# Patient Record
Sex: Female | Born: 1965 | Race: White | Hispanic: No | Marital: Married | Smoking: Never smoker
Health system: Southern US, Community
[De-identification: ages and names within clinical notes are randomized; demographics above are authoritative.]

## PROBLEM LIST (undated history)

## (undated) DIAGNOSIS — I5042 Chronic combined systolic (congestive) and diastolic (congestive) heart failure: Secondary | ICD-10-CM

## (undated) DIAGNOSIS — K219 Gastro-esophageal reflux disease without esophagitis: Secondary | ICD-10-CM

## (undated) DIAGNOSIS — M109 Gout, unspecified: Secondary | ICD-10-CM

## (undated) DIAGNOSIS — N189 Chronic kidney disease, unspecified: Secondary | ICD-10-CM

## (undated) DIAGNOSIS — I25119 Atherosclerotic heart disease of native coronary artery with unspecified angina pectoris: Secondary | ICD-10-CM

## (undated) DIAGNOSIS — I110 Hypertensive heart disease with heart failure: Secondary | ICD-10-CM

## (undated) DIAGNOSIS — Z95810 Presence of automatic (implantable) cardiac defibrillator: Secondary | ICD-10-CM

## (undated) DIAGNOSIS — I420 Dilated cardiomyopathy: Secondary | ICD-10-CM

## (undated) DIAGNOSIS — F39 Unspecified mood [affective] disorder: Secondary | ICD-10-CM

## (undated) HISTORY — DX: Chronic combined systolic (congestive) and diastolic (congestive) heart failure: I50.42

## (undated) HISTORY — DX: Chronic kidney disease, unspecified: N18.9

## (undated) HISTORY — DX: Unspecified mood (affective) disorder: F39

## (undated) HISTORY — DX: Gastro-esophageal reflux disease without esophagitis: K21.9

## (undated) HISTORY — DX: Atherosclerotic heart disease of native coronary artery with unspecified angina pectoris: I25.119

## (undated) HISTORY — DX: Gout, unspecified: M10.9

## (undated) HISTORY — DX: Dilated cardiomyopathy: I42.0

## (undated) HISTORY — DX: Hypertensive heart disease with heart failure: I11.0

---

## 2011-06-10 DIAGNOSIS — I420 Dilated cardiomyopathy: Secondary | ICD-10-CM | POA: Insufficient documentation

## 2011-06-10 DIAGNOSIS — I110 Hypertensive heart disease with heart failure: Secondary | ICD-10-CM

## 2011-06-10 DIAGNOSIS — Z95810 Presence of automatic (implantable) cardiac defibrillator: Secondary | ICD-10-CM

## 2011-06-10 HISTORY — DX: Presence of automatic (implantable) cardiac defibrillator: Z95.810

## 2011-06-10 HISTORY — DX: Hypertensive heart disease with heart failure: I11.0

## 2011-06-10 HISTORY — DX: Dilated cardiomyopathy: I42.0

## 2012-06-09 DIAGNOSIS — I2510 Atherosclerotic heart disease of native coronary artery without angina pectoris: Secondary | ICD-10-CM | POA: Insufficient documentation

## 2012-06-09 DIAGNOSIS — I25119 Atherosclerotic heart disease of native coronary artery with unspecified angina pectoris: Secondary | ICD-10-CM

## 2012-06-09 HISTORY — DX: Atherosclerotic heart disease of native coronary artery with unspecified angina pectoris: I25.119

## 2013-06-09 HISTORY — PX: CARDIAC DEFIBRILLATOR PLACEMENT: SHX171

## 2017-06-09 HISTORY — PX: TRANSTHORACIC ECHOCARDIOGRAM: SHX275

## 2017-07-02 ENCOUNTER — Other Ambulatory Visit: Payer: Self-pay

## 2017-07-02 ENCOUNTER — Emergency Department (HOSPITAL_COMMUNITY): Payer: Medicaid Other

## 2017-07-02 ENCOUNTER — Inpatient Hospital Stay (HOSPITAL_COMMUNITY)
Admission: EM | Admit: 2017-07-02 | Discharge: 2017-07-05 | DRG: 315 | Disposition: A | Discharge status: Home / Self Care | Payer: Medicaid Other | Attending: Family Medicine | Admitting: Family Medicine

## 2017-07-02 ENCOUNTER — Encounter (HOSPITAL_COMMUNITY): Payer: Self-pay

## 2017-07-02 DIAGNOSIS — D259 Leiomyoma of uterus, unspecified: Secondary | ICD-10-CM | POA: Diagnosis present

## 2017-07-02 DIAGNOSIS — Z833 Family history of diabetes mellitus: Secondary | ICD-10-CM

## 2017-07-02 DIAGNOSIS — N390 Urinary tract infection, site not specified: Secondary | ICD-10-CM | POA: Diagnosis present

## 2017-07-02 DIAGNOSIS — Z79891 Long term (current) use of opiate analgesic: Secondary | ICD-10-CM

## 2017-07-02 DIAGNOSIS — I319 Disease of pericardium, unspecified: Principal | ICD-10-CM | POA: Diagnosis present

## 2017-07-02 DIAGNOSIS — R200 Anesthesia of skin: Secondary | ICD-10-CM | POA: Diagnosis present

## 2017-07-02 DIAGNOSIS — Z95810 Presence of automatic (implantable) cardiac defibrillator: Secondary | ICD-10-CM

## 2017-07-02 DIAGNOSIS — B349 Viral infection, unspecified: Secondary | ICD-10-CM | POA: Diagnosis present

## 2017-07-02 DIAGNOSIS — Z955 Presence of coronary angioplasty implant and graft: Secondary | ICD-10-CM

## 2017-07-02 DIAGNOSIS — R0789 Other chest pain: Secondary | ICD-10-CM | POA: Diagnosis present

## 2017-07-02 DIAGNOSIS — I160 Hypertensive urgency: Secondary | ICD-10-CM | POA: Diagnosis present

## 2017-07-02 DIAGNOSIS — Z7982 Long term (current) use of aspirin: Secondary | ICD-10-CM

## 2017-07-02 DIAGNOSIS — R079 Chest pain, unspecified: Secondary | ICD-10-CM | POA: Diagnosis not present

## 2017-07-02 DIAGNOSIS — E785 Hyperlipidemia, unspecified: Secondary | ICD-10-CM | POA: Diagnosis present

## 2017-07-02 DIAGNOSIS — I5030 Unspecified diastolic (congestive) heart failure: Secondary | ICD-10-CM | POA: Diagnosis present

## 2017-07-02 DIAGNOSIS — Z79899 Other long term (current) drug therapy: Secondary | ICD-10-CM

## 2017-07-02 DIAGNOSIS — M793 Panniculitis, unspecified: Secondary | ICD-10-CM | POA: Diagnosis present

## 2017-07-02 DIAGNOSIS — R011 Cardiac murmur, unspecified: Secondary | ICD-10-CM | POA: Diagnosis present

## 2017-07-02 DIAGNOSIS — I110 Hypertensive heart disease with heart failure: Secondary | ICD-10-CM | POA: Diagnosis present

## 2017-07-02 DIAGNOSIS — M109 Gout, unspecified: Secondary | ICD-10-CM | POA: Diagnosis present

## 2017-07-02 DIAGNOSIS — Z8249 Family history of ischemic heart disease and other diseases of the circulatory system: Secondary | ICD-10-CM

## 2017-07-02 DIAGNOSIS — I252 Old myocardial infarction: Secondary | ICD-10-CM

## 2017-07-02 DIAGNOSIS — Z8349 Family history of other endocrine, nutritional and metabolic diseases: Secondary | ICD-10-CM

## 2017-07-02 DIAGNOSIS — I2510 Atherosclerotic heart disease of native coronary artery without angina pectoris: Secondary | ICD-10-CM | POA: Diagnosis present

## 2017-07-02 DIAGNOSIS — R000 Tachycardia, unspecified: Secondary | ICD-10-CM | POA: Diagnosis present

## 2017-07-02 DIAGNOSIS — I340 Nonrheumatic mitral (valve) insufficiency: Secondary | ICD-10-CM | POA: Diagnosis present

## 2017-07-02 DIAGNOSIS — I255 Ischemic cardiomyopathy: Secondary | ICD-10-CM | POA: Diagnosis present

## 2017-07-02 DIAGNOSIS — Z791 Long term (current) use of non-steroidal anti-inflammatories (NSAID): Secondary | ICD-10-CM

## 2017-07-02 DIAGNOSIS — Z9114 Patient's other noncompliance with medication regimen: Secondary | ICD-10-CM

## 2017-07-02 DIAGNOSIS — M546 Pain in thoracic spine: Secondary | ICD-10-CM | POA: Diagnosis present

## 2017-07-02 DIAGNOSIS — K439 Ventral hernia without obstruction or gangrene: Secondary | ICD-10-CM | POA: Diagnosis present

## 2017-07-02 DIAGNOSIS — J988 Other specified respiratory disorders: Secondary | ICD-10-CM | POA: Diagnosis present

## 2017-07-02 HISTORY — DX: Presence of automatic (implantable) cardiac defibrillator: Z95.810

## 2017-07-02 LAB — I-STAT CHEM 8, ED
BUN: 16 mg/dL (ref 6–20)
CALCIUM ION: 1.19 mmol/L (ref 1.15–1.40)
CHLORIDE: 107 mmol/L (ref 101–111)
Creatinine, Ser: 1.2 mg/dL — ABNORMAL HIGH (ref 0.44–1.00)
Glucose, Bld: 102 mg/dL — ABNORMAL HIGH (ref 65–99)
HEMATOCRIT: 37 % (ref 36.0–46.0)
Hemoglobin: 12.6 g/dL (ref 12.0–15.0)
POTASSIUM: 3.8 mmol/L (ref 3.5–5.1)
SODIUM: 141 mmol/L (ref 135–145)
TCO2: 23 mmol/L (ref 22–32)

## 2017-07-02 LAB — CBC WITH DIFFERENTIAL/PLATELET
Basophils Absolute: 0 10*3/uL (ref 0.0–0.1)
Basophils Relative: 1 %
EOS ABS: 0.3 10*3/uL (ref 0.0–0.7)
Eosinophils Relative: 4 %
HCT: 37.1 % (ref 36.0–46.0)
HEMOGLOBIN: 12.3 g/dL (ref 12.0–15.0)
LYMPHS ABS: 2.6 10*3/uL (ref 0.7–4.0)
Lymphocytes Relative: 40 %
MCH: 30.3 pg (ref 26.0–34.0)
MCHC: 33.2 g/dL (ref 30.0–36.0)
MCV: 91.4 fL (ref 78.0–100.0)
Monocytes Absolute: 0.7 10*3/uL (ref 0.1–1.0)
Monocytes Relative: 10 %
NEUTROS PCT: 45 %
Neutro Abs: 3 10*3/uL (ref 1.7–7.7)
PLATELETS: 270 10*3/uL (ref 150–400)
RBC: 4.06 MIL/uL (ref 3.87–5.11)
RDW: 14.7 % (ref 11.5–15.5)
WBC: 6.6 10*3/uL (ref 4.0–10.5)

## 2017-07-02 LAB — TROPONIN I

## 2017-07-02 LAB — I-STAT TROPONIN, ED: Troponin i, poc: 0 ng/mL (ref 0.00–0.08)

## 2017-07-02 LAB — SEDIMENTATION RATE: SED RATE: 62 mm/h — AB (ref 0–22)

## 2017-07-02 MED ORDER — SODIUM CHLORIDE 0.9% FLUSH: 3.0000 mL | INTRAVENOUS | Status: DC | PRN

## 2017-07-02 MED ORDER — NITROGLYCERIN 0.4 MG SL SUBL
0.4000 mg | SUBLINGUAL_TABLET | Freq: Once | SUBLINGUAL | Status: AC
  Administered 2017-07-02: 0.4 mg via SUBLINGUAL
  Filled 2017-07-02: qty 1

## 2017-07-02 MED ORDER — HYDROMORPHONE HCL 1 MG/ML IJ SOLN
1.0000 mg | INTRAMUSCULAR | Status: DC | PRN
  Administered 2017-07-02 – 2017-07-05 (×10): 1 mg via INTRAVENOUS
  Filled 2017-07-02 (×12): qty 1

## 2017-07-02 MED ORDER — ACETAMINOPHEN 650 MG RE SUPP: 650.0000 mg | Freq: Four times a day (QID) | RECTAL | Status: DC | PRN

## 2017-07-02 MED ORDER — CARVEDILOL 12.5 MG PO TABS
12.5000 mg | ORAL_TABLET | Freq: Two times a day (BID) | ORAL | Status: DC
  Administered 2017-07-03 – 2017-07-05 (×5): 12.5 mg via ORAL
  Filled 2017-07-02 (×5): qty 1

## 2017-07-02 MED ORDER — ATORVASTATIN CALCIUM 40 MG PO TABS
80.0000 mg | ORAL_TABLET | Freq: Every day | ORAL | Status: AC
  Administered 2017-07-03: 80 mg via ORAL
  Filled 2017-07-02: qty 2

## 2017-07-02 MED ORDER — ZOLPIDEM TARTRATE 5 MG PO TABS
5.0000 mg | ORAL_TABLET | Freq: Every day | ORAL | Status: DC
  Administered 2017-07-03 – 2017-07-04 (×3): 5 mg via ORAL
  Filled 2017-07-02 (×3): qty 1

## 2017-07-02 MED ORDER — CLONIDINE HCL 0.1 MG PO TABS
0.1000 mg | ORAL_TABLET | Freq: Every day | ORAL | Status: DC
  Administered 2017-07-03 – 2017-07-05 (×3): 0.1 mg via ORAL
  Filled 2017-07-02 (×3): qty 1

## 2017-07-02 MED ORDER — ENOXAPARIN SODIUM 40 MG/0.4ML ~~LOC~~ SOLN
40.0000 mg | SUBCUTANEOUS | Status: DC
  Administered 2017-07-02 – 2017-07-04 (×3): 40 mg via SUBCUTANEOUS
  Filled 2017-07-02 (×3): qty 0.4

## 2017-07-02 MED ORDER — MORPHINE SULFATE (PF) 4 MG/ML IV SOLN
4.0000 mg | Freq: Once | INTRAVENOUS | Status: AC
  Administered 2017-07-02: 4 mg via INTRAVENOUS
  Filled 2017-07-02: qty 1

## 2017-07-02 MED ORDER — HYDRALAZINE HCL 20 MG/ML IJ SOLN
5.0000 mg | Freq: Four times a day (QID) | INTRAMUSCULAR | Status: DC | PRN
  Administered 2017-07-02 – 2017-07-05 (×3): 5 mg via INTRAVENOUS
  Filled 2017-07-02 (×3): qty 1

## 2017-07-02 MED ORDER — PREDNISONE 10 MG PO TABS
10.0000 mg | ORAL_TABLET | Freq: Every day | ORAL | Status: DC
  Administered 2017-07-03: 10 mg via ORAL
  Filled 2017-07-02: qty 1

## 2017-07-02 MED ORDER — IOPAMIDOL (ISOVUE-370) INJECTION 76%
INTRAVENOUS | Status: AC
  Filled 2017-07-02: qty 100

## 2017-07-02 MED ORDER — SODIUM CHLORIDE 0.9% FLUSH
3.0000 mL | Freq: Two times a day (BID) | INTRAVENOUS | Status: DC
  Administered 2017-07-02 – 2017-07-04 (×5): 3 mL via INTRAVENOUS

## 2017-07-02 MED ORDER — PANTOPRAZOLE SODIUM 40 MG PO TBEC
40.0000 mg | DELAYED_RELEASE_TABLET | Freq: Every day | ORAL | Status: DC
Start: 2017-07-03 — End: 2017-07-05
  Administered 2017-07-03 – 2017-07-05 (×3): 40 mg via ORAL
  Filled 2017-07-02 (×3): qty 1

## 2017-07-02 MED ORDER — ASPIRIN EC 81 MG PO TBEC
81.0000 mg | DELAYED_RELEASE_TABLET | Freq: Every day | ORAL | Status: DC
  Administered 2017-07-03 – 2017-07-05 (×3): 81 mg via ORAL
  Filled 2017-07-02 (×3): qty 1

## 2017-07-02 MED ORDER — SODIUM CHLORIDE 0.9 % IV SOLN: 250.0000 mL | INTRAVENOUS | Status: DC | PRN

## 2017-07-02 MED ORDER — FUROSEMIDE 20 MG PO TABS
20.0000 mg | ORAL_TABLET | Freq: Every day | ORAL | Status: DC
  Administered 2017-07-03 – 2017-07-05 (×3): 20 mg via ORAL
  Filled 2017-07-02 (×3): qty 1

## 2017-07-02 MED ORDER — ACETAMINOPHEN 325 MG PO TABS: 650.0000 mg | ORAL_TABLET | Freq: Four times a day (QID) | ORAL | Status: DC | PRN

## 2017-07-02 MED ORDER — IOPAMIDOL (ISOVUE-370) INJECTION 76%
100.0000 mL | Freq: Once | INTRAVENOUS | Status: AC | PRN
  Administered 2017-07-02: 100 mL via INTRAVENOUS

## 2017-07-02 MED ORDER — CLONIDINE HCL 0.1 MG PO TABS
0.2000 mg | ORAL_TABLET | Freq: Once | ORAL | Status: AC
  Administered 2017-07-02: 0.2 mg via ORAL
  Filled 2017-07-02: qty 2

## 2017-07-02 MED ORDER — TETRAHYDROZOLINE HCL 0.05 % OP SOLN: 2.0000 [drp] | Freq: Every day | OPHTHALMIC | Status: DC | PRN

## 2017-07-02 NOTE — ED Notes (Signed)
EDP J AWARE PT IS IN PAIN. EDP REPLY - HOSPITALIST HAS BEEN CONSULTED.

## 2017-07-02 NOTE — ED Notes (Signed)
Patient transported to CT 

## 2017-07-02 NOTE — ED Notes (Signed)
Bed: WA13 Expected date:  Expected time:  Means of arrival:  Comments: Hold for RES B 

## 2017-07-02 NOTE — ED Notes (Signed)
ED TO INPATIENT HANDOFF REPORT  Name/Age/Gender Kathy Bruce 52 y.o. female  Code Status Code Status History    This patient does not have a recorded code status. Please follow your organizational policy for patients in this situation.      Home/SNF/Other Home  Chief Complaint chest pain  Level of Care/Admitting Diagnosis ED Disposition    ED Disposition Condition Comment   Admit  Hospital Area: [facility] [[id number]]  Level of Care: Telemetry [5]  Admit to tele based on following criteria: Monitor for Ischemic changes  Diagnosis: Chest pain [381017]  Admitting Physician: Jani Gravel [[id number]]  Attending Physician: Jani Gravel [[id number]]  PT Class (Do Not Modify): Observation [104]  PT Acc Code (Do Not Modify): Observation [10022]       Medical History Past Medical History:  Diagnosis Date  . CHF (congestive heart failure) ([place]) 2013   Patient reports this was secondary to MI and she to have AICD placed.   . Coronary artery disease 2013   Patient reports MI in 2013 with stent placement when patient was living in [place]. Need outside records.   . Hypertension   . 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/02/2017    Allergies Not on File  IV Location/Drains/Wounds Patient Lines/Drains/Airways Status   Active Line/Drains/Airways    Name:   Placement date:   Placement time:   Site:   Days:   Peripheral IV 07/02/17 Left Wrist   07/02/17    0959    Wrist   less than 1   Peripheral IV 07/02/17 Left Antecubital   07/02/17    1001    Antecubital   less than 1          Labs/Imaging Results for orders placed or performed during the hospital encounter of 07/02/17 (from the past 48 hour(s))  CBC with Differential     Status: None   Collection Time: 07/02/17 10:00 AM  Result Value Ref Range   WBC 6.6 4.0 - 10.5 K/uL   RBC 4.06 3.87 - 5.11 MIL/uL   Hemoglobin 12.3 12.0 - 15.0 g/dL   HCT 37.1 36.0 - 46.0 %   MCV 91.4 78.0 - 100.0  fL   MCH 30.3 26.0 - 34.0 pg   MCHC 33.2 30.0 - 36.0 g/dL   RDW 14.7 11.5 - 15.5 %   Platelets 270 150 - 400 K/uL   Neutrophils Relative % 45 %   Neutro Abs 3.0 1.7 - 7.7 K/uL   Lymphocytes Relative 40 %   Lymphs Abs 2.6 0.7 - 4.0 K/uL   Monocytes Relative 10 %   Monocytes Absolute 0.7 0.1 - 1.0 K/uL   Eosinophils Relative 4 %   Eosinophils Absolute 0.3 0.0 - 0.7 K/uL   Basophils Relative 1 %   Basophils Absolute 0.0 0.0 - 0.1 K/uL  I-Stat Troponin, ED (not at [facility])     Status: None   Collection Time: 07/02/17 10:06 AM  Result Value Ref Range   Troponin i, poc 0.00 0.00 - 0.08 ng/mL   Comment 3            Comment: Due to the release kinetics of cTnI, a negative result within the first hours of the onset of symptoms does not rule out myocardial infarction with certainty. If myocardial infarction is still suspected, repeat the test at appropriate intervals.   I-stat Chem 8, ED     Status: Abnormal   Collection Time: 07/02/17 10:07 AM  Result Value Ref Range  Sodium 141 135 - 145 mmol/L   Potassium 3.8 3.5 - 5.1 mmol/L   Chloride 107 101 - 111 mmol/L   BUN 16 6 - 20 mg/dL   Creatinine, Ser 1.20 (H) 0.44 - 1.00 mg/dL   Glucose, Bld 102 (H) 65 - 99 mg/dL   Calcium, Ion 1.19 1.15 - 1.40 mmol/L   TCO2 23 22 - 32 mmol/L   Hemoglobin 12.6 12.0 - 15.0 g/dL   HCT 37.0 36.0 - 46.0 %   Dg Chest Portable 1 View  Result Date: 07/02/2017 CLINICAL DATA:  Back pain for 2 days and chest pain starting yesterday. EXAM: PORTABLE CHEST 1 VIEW COMPARISON:  None. FINDINGS: Subcutaneous implantable cardioverter-defibrillator noted. Mild enlargement of the cardiopericardial silhouette. The lungs appear clear. No pleural effusion identified. IMPRESSION: 1. Mild enlargement of the cardiopericardial silhouette, without edema. 2. Subcutaneous implantable cardioverter-defibrillator noted. Electronically Signed   By: Van Clines M.D.   On: 07/02/2017 10:58   Ct Angio Chest/abd/pel For Dissection W  And/or Wo Contrast  Result Date: 07/02/2017 CLINICAL DATA:  Chest and abdominal pain EXAM: CT ANGIOGRAPHY CHEST, ABDOMEN AND PELVIS TECHNIQUE: Initially, axial CT images were obtained through the chest without intravenous contrast material administration. Multidetector CT imaging through the chest, abdomen and pelvis was performed using the standard protocol during bolus administration of intravenous contrast. Multiplanar reconstructed images and MIPs were obtained and reviewed to evaluate the vascular anatomy. CONTRAST:  157mL ISOVUE-370 IOPAMIDOL (ISOVUE-370) INJECTION 76% COMPARISON:  Chest radiograph July 02, 2017 FINDINGS: CTA CHEST FINDINGS Cardiovascular: There is no intramural hematoma in the thoracic aorta on the noncontrast enhanced study. There is no thoracic aortic aneurysm or dissection. The visualized great vessels appear unremarkable. Note that the right and left common carotid arteries arise as a common trunk, an anatomic variant. There is no appreciable pericardial thickening or pericardial effusion. There is no demonstrable pulmonary embolus. A defibrillator is present on the left with the defibrillator lead tip in the soft tissues anteriorly on the left. Mediastinum/Nodes: Visualized thyroid appears normal. No adenopathy is evident in the thoracic region by size criteria. There are scattered subcentimeter axillary lymph nodes, regarded as nonspecific. No esophageal lesions are evident. Lungs/Pleura: There is no lung edema or consolidation. No pleural effusion or pleural thickening evident. Musculoskeletal: There are no blastic or lytic bone lesions. Review of the MIP images confirms the above findings. CTA ABDOMEN AND PELVIS FINDINGS VASCULAR Aorta: No appreciable thoracic aortic aneurysm or dissection. No appreciable aortic narrowing or calcification. No appreciable atherosclerotic plaque. Celiac: Celiac artery and its major branches appear patent. No aneurysm or dissection. SMA: Superior  mesenteric artery and its branches appear patent. No aneurysm or dissection. Either renal artery or its branches. No fibromuscular dysplasia. No aneurysm or dissection on either side. Each IMA: Inferior mesenteric artery and its branches are patent. No aneurysm or dissection. Inflow: Major pelvic arterial vessels are patent as are proximal superficial femoral and profunda femoral arteries. No pelvic or upper thigh vascular atherosclerosis appreciable. No aneurysm or dissection in the major pelvic arterial vessels. Veins: No obvious venous abnormality within the limitations of this arterial phase study. Review of the MIP images confirms the above findings. NON-VASCULAR Hepatobiliary: No focal liver lesions are apparent. Gallbladder wall is not appreciably thickened. There is no biliary duct dilatation. Pancreas: No pancreatic mass or inflammatory focus. Spleen: No splenic lesions are evident. Adrenals/Urinary Tract: Adrenals bilaterally appear unremarkable. Kidneys bilaterally show no evident mass or hydronephrosis on either side. There is no appreciable renal or  ureteral calculus on either side. Urinary bladder is midline with wall thickness within normal limits. Stomach/Bowel: There is no appreciable bowel wall or mesenteric thickening. No evident bowel obstruction. No free air or portal venous air. Lymphatic: No adenopathy is apparent in the abdomen or pelvis. Reproductive: Uterus is anteverted. Uterus appears somewhat lobulated in contour, likely due to underlying leiomyomatous change. There is a mass arising from the rightward aspect of the uterus somewhat eccentrically felt to represent a leiomyoma measuring 5.5 x 4.4 x 3.6 cm. No extrauterine pelvic mass evident. Other: Appendix appears unremarkable. No abscess or ascites is evident in the abdomen or pelvis. There is a focal ventral hernia slightly superior to the umbilicus containing fat and mild changes of panniculitis. This hernia has a neck measurement  from right to left of 1.2 cm and from superior to inferior dimension of 0.8 cm. No bowel containing hernia evident. Musculoskeletal: There are no blastic or lytic bone lesions. There is no intramuscular or abdominal wall lesion. Review of the MIP images confirms the above findings. IMPRESSION: CT angiogram chest: 1. No thoracic aortic aneurysm or dissection. No evident pulmonary embolus. 2. Subcutaneous defibrillator present with lead tip anterior left chest wall. 3.  No lung edema or consolidation. 4.  No evident thoracic adenopathy. CT angiogram abdomen; CT angiogram pelvis: 1. No aneurysm or dissection in the aorta or major mesenteric/pelvic arterial vessels. No appreciable atherosclerotic change noted in the arterial vessels in the abdomen or pelvis. 2. Focal ventral hernia slightly superior to the umbilicus which contains fat and mild panniculitis. No bowel containing hernia evident. 3.  Leiomyomatous uterus. 4.  No bowel obstruction.  No abscess.  No ascites. 5.  No renal or ureteral calculus.  No hydronephrosis. Electronically Signed   By: Lowella Grip III M.D.   On: 07/02/2017 12:05    Pending Labs Unresulted Labs (From admission, onward)   Start     Ordered   07/02/17 1626  Sedimentation rate  Once,   R     07/02/17 1625   Signed and Held  HIV antibody (Routine Testing)  Once,   R     Signed and Held   Signed and Held  Creatinine, serum  (enoxaparin (LOVENOX)    CrCl >/= 30 ml/min)  Weekly,   R    Comments:  while on enoxaparin therapy    Signed and Held   Signed and Held  Comprehensive metabolic panel  Tomorrow morning,   R     Signed and Held   Signed and Held  CBC  Tomorrow morning,   R     Signed and Held   Signed and Held  Troponin I  Now then every 6 hours,   R     Signed and Held   Signed and Held  Lipid panel  Tomorrow morning,   R     Signed and Held      Vitals/Pain Today's Vitals   07/02/17 1902 07/02/17 1904 07/02/17 1905 07/02/17 1946  BP: (!) 145/101   (!)  154/93  Pulse:  92  (!) 109  Resp: (!) 9 17  20   Temp:      TempSrc:      SpO2:  98%  100%  Weight:      Height:      PainSc:   7      Isolation Precautions No active isolations  Medications Medications  iopamidol (ISOVUE-370) 76 % injection (not administered)  iopamidol (ISOVUE-370) 76 % injection 100 mL (  100 mLs Intravenous Contrast Given 07/02/17 1129)  cloNIDine (CATAPRES) tablet 0.2 mg (0.2 mg Oral Given 07/02/17 1343)  nitroGLYCERIN (NITROSTAT) SL tablet 0.4 mg (0.4 mg Sublingual Given 07/02/17 1342)  morphine 4 MG/ML injection 4 mg (4 mg Intravenous Given 07/02/17 1902)    Mobility walks

## 2017-07-02 NOTE — ED Provider Notes (Signed)
Complaint of anterior chest pain constant since this morning.  IV morphine ordered.  I have consulted Dr. Maudie Mercury who will arrange for overnight stay.   Orlie Dakin, MD 07/02/17 Bosie Helper

## 2017-07-02 NOTE — H&P (Signed)
TRH H&P   Patient Demographics:    Kathy Bruce, is a 52 y.o. female  MRN: [id number]   DOB - Jan 12, 1966  Admit Date - 07/02/2017  Outpatient Primary MD for the patient is Patient, No Pcp Per  Referring MD/NP/PA: Rod Holler  Outpatient Specialists:   Patient coming from: home  Chief Complaint  Patient presents with  . Chest Pain      HPI:    Kathy Bruce  is a 52 y.o. female, CAD, CHF (EF 25 approx), s/p ICD, 2013, hypertension, apparently c/o chest pain since last nite. Tightness,  Substernal, took 2 slg nitro without relief.  Apparently she has had upper back pain for the past 3 days.  Pt had some kind of viral illness over the weekend.   Pt states that continued to have chest discomfort this am without relief from slg nitro and therefore presented to ER.   In ED,  CTA chest  IMPRESSION: CT angiogram chest:  1. No thoracic aortic aneurysm or dissection. No evident pulmonary embolus.  2. Subcutaneous defibrillator present with lead tip anterior left chest wall.  3.  No lung edema or consolidation.  4.  No evident thoracic adenopathy.  CT angiogram abdomen; CT angiogram pelvis:  1. No aneurysm or dissection in the aorta or major mesenteric/pelvic arterial vessels. No appreciable atherosclerotic change noted in the arterial vessels in the abdomen or pelvis.  2. Focal ventral hernia slightly superior to the umbilicus which contains fat and mild panniculitis. No bowel containing hernia evident.  3.  Leiomyomatous uterus.  4.  No bowel obstruction.  No abscess.  No ascites.  5.  No renal or ureteral calculus.  No hydronephrosis.  Wbc 6.6, hgb 12.3, Plt 270 Trop 0.00 Na 141, K 3.8, Bun 16, Creatinine 1.20  ESR pending  EKG ST at 100, nl axis,  Slight st depression in v5,6  Cardiology consulted by ED.   Pt will be admitted for chest  pain  .       Review of systems:    In addition to the HPI above, No Fever-chills, No Headache, No changes with Vision or hearing, No problems swallowing food or Liquids, No Cough or Shortness of Breath, No Abdominal pain, No Nausea or Vommitting, Bowel movements are regular, No Blood in stool or Urine, No dysuria, No new skin rashes or bruises, No new joints pains-aches,  No new weakness, tingling, numbness in any extremity, No recent weight gain or loss, No polyuria, polydypsia or polyphagia, No significant Mental Stressors.  A full 10 point Review of Systems was done, except as stated above, all other Review of Systems were negative.   With Past History of the following :    Past Medical History:  Diagnosis Date  . CHF (congestive heart failure) ([place]) 2013   Patient reports this was secondary to MI and she to have AICD placed.   Marland Kitchen  Coronary artery disease 2013   Patient reports MI in 2013 with stent placement when patient was living in [place]. Need outside records.   . Hypertension   . ICD (implantable cardioverter-defibrillator) in place, [facility], 2013 07/02/2017      Past Surgical History:  Procedure Laterality Date  . [employer] ICD      Social History:     Social History   Tobacco Use  . Smoking status: Never Smoker  . Smokeless tobacco: Never Used  Substance Use Topics  . Alcohol use: Yes    Frequency: Never    Comment: Occasionally     Lives - at home  Mobility - walks by self    Family History :     Family History  Problem Relation Age of Onset  . Hyperthyroidism Mother   . Diabetes Mother   . Thyroid disease Brother       Home Medications:   Prior to Admission medications   Medication Sig Start Date End Date Taking? Authorizing Provider  aspirin EC 81 MG tablet Take 81 mg by mouth daily.   Yes [provider]  carvedilol (COREG) 12.5 MG tablet Take 12.5 mg by mouth 2 (two) times daily  with a meal.   Yes [provider]  cloNIDine (CATAPRES) 0.2 MG tablet Take 0.1 mg by mouth daily.   Yes [provider]  colchicine 0.6 MG tablet Take 0.6 mg by mouth daily as needed (GOUT FLARE UP).   Yes [provider]  furosemide (LASIX) 20 MG tablet Take 20 mg by mouth.   Yes [provider]  naproxen (NAPROSYN) 500 MG tablet Take 500 mg by mouth 2 (two) times daily as needed for mild pain.   Yes [provider]  omeprazole (PRILOSEC) 40 MG capsule Take 40 mg by mouth daily.   Yes [provider]  oxyCODONE-acetaminophen (PERCOCET) 10-325 MG tablet Take 1 tablet by mouth 2 (two) times daily as needed for pain.   Yes [provider]  predniSONE (DELTASONE) 10 MG tablet Take 10 mg by mouth daily with breakfast.   Yes [provider]  Tetrahydrozoline HCl (VISINE OP) Place 2 drops into both eyes daily as needed (DRY EYES).   Yes [provider]  zolpidem (AMBIEN) 10 MG tablet Take 10 mg by mouth at bedtime.   Yes [provider]     Allergies:    Not on File   Physical Exam:   Vitals  Blood pressure (!) 154/93, pulse (!) 109, temperature 98 F (36.7 C), temperature source Oral, resp. rate 20, height 5' 2"  (1.575 m), weight 83 kg (183 lb), SpO2 100 %.   1. General  lying in bed in NAD,    2. Normal affect and insight, Not Suicidal or Homicidal, Awake Alert, Oriented X 3.  3. No F.N deficits, ALL C.Nerves Intact, Strength 5/5 all 4 extremities, Sensation intact all 4 extremities, Plantars down going.  4. Ears and Eyes appear Normal, Conjunctivae clear, PERRLA. Moist Oral Mucosa.  5. Supple Neck, No JVD, No cervical lymphadenopathy appriciated, No Carotid Bruits.  6. Symmetrical Chest wall movement, Good air movement bilaterally, CTAB.  7. RRR, No Gallops, Rubs or Murmurs, No Parasternal Heave.  8. Positive Bowel Sounds, Abdomen Soft, No tenderness, No organomegaly appriciated,No rebound  -guarding or rigidity.  9.  No Cyanosis, Normal Skin Turgor, No Skin Rash or Bruise.  10. Good muscle tone,  joints appear normal , no effusions,  Normal ROM.  11. No Palpable Lymph Nodes in Neck or Axillae  +chest wall tenderness in substernal chest + ICD    Data Review:    CBC Recent Labs  Lab 07/02/17 1000 07/02/17 1007  WBC 6.6  --   HGB 12.3 12.6  HCT 37.1 37.0  PLT 270  --   MCV 91.4  --   MCH 30.3  --   MCHC 33.2  --   RDW 14.7  --   LYMPHSABS 2.6  --   MONOABS 0.7  --   EOSABS 0.3  --   BASOSABS 0.0  --    ------------------------------------------------------------------------------------------------------------------  Chemistries  Recent Labs  Lab 07/02/17 1007  NA 141  K 3.8  CL 107  GLUCOSE 102*  BUN 16  CREATININE 1.20*   ------------------------------------------------------------------------------------------------------------------ estimated creatinine clearance is 55.4 mL/min (A) (by C-G formula based on SCr of 1.2 mg/dL (H)). ------------------------------------------------------------------------------------------------------------------ No results for input(s): TSH, T4TOTAL, T3FREE, THYROIDAB in the last 72 hours.  Invalid input(s): FREET3  Coagulation profile No results for input(s): INR, PROTIME in the last 168 hours. ------------------------------------------------------------------------------------------------------------------- No results for input(s): DDIMER in the last 72 hours. -------------------------------------------------------------------------------------------------------------------  Cardiac Enzymes No results for input(s): CKMB, TROPONINI, MYOGLOBIN in the last 168 hours.  Invalid input(s): CK ------------------------------------------------------------------------------------------------------------------ No results found for:  BNP   ---------------------------------------------------------------------------------------------------------------  Urinalysis No results found for: COLORURINE, APPEARANCEUR, LABSPEC, PHURINE, GLUCOSEU, HGBUR, BILIRUBINUR, KETONESUR, PROTEINUR, UROBILINOGEN, NITRITE, LEUKOCYTESUR  ----------------------------------------------------------------------------------------------------------------   Imaging Results:    Dg Chest Portable 1 View  Result Date: 07/02/2017 CLINICAL DATA:  Back pain for 2 days and chest pain starting yesterday. EXAM: PORTABLE CHEST 1 VIEW COMPARISON:  None. FINDINGS: Subcutaneous implantable cardioverter-defibrillator noted. Mild enlargement of the cardiopericardial silhouette. The lungs appear clear. No pleural effusion identified. IMPRESSION: 1. Mild enlargement of the cardiopericardial silhouette, without edema. 2. Subcutaneous implantable cardioverter-defibrillator noted. Electronically Signed   By: Van Clines M.D.   On: 07/02/2017 10:58   Ct Angio Chest/abd/pel For Dissection W And/or Wo Contrast  Result Date: 07/02/2017 CLINICAL DATA:  Chest and abdominal pain EXAM: CT ANGIOGRAPHY CHEST, ABDOMEN AND PELVIS TECHNIQUE: Initially, axial CT images were obtained through the chest without intravenous contrast material administration. Multidetector CT imaging through the chest, abdomen and pelvis was performed using the standard protocol during bolus administration of intravenous contrast. Multiplanar reconstructed images and MIPs were obtained and reviewed to evaluate the vascular anatomy. CONTRAST:  137m ISOVUE-370 IOPAMIDOL (ISOVUE-370) INJECTION 76% COMPARISON:  Chest radiograph July 02, 2017 FINDINGS: CTA CHEST FINDINGS Cardiovascular: There is no intramural hematoma in the thoracic aorta on the noncontrast enhanced study. There is no thoracic aortic aneurysm or dissection. The visualized great vessels appear unremarkable. Note that the right and left common  carotid arteries arise as a common trunk, an anatomic variant. There is no appreciable pericardial thickening or pericardial effusion. There is no demonstrable pulmonary embolus. A defibrillator is present on the left with the defibrillator lead tip in the soft tissues anteriorly on the left. Mediastinum/Nodes: Visualized thyroid appears normal. No adenopathy is evident in the thoracic region by size criteria. There are scattered subcentimeter axillary lymph nodes, regarded as nonspecific. No esophageal lesions are evident. Lungs/Pleura: There is no lung edema or consolidation. No pleural effusion or pleural thickening evident. Musculoskeletal: There are no blastic or lytic bone lesions. Review of the MIP images confirms the above findings. CTA ABDOMEN AND PELVIS FINDINGS VASCULAR Aorta: No appreciable thoracic aortic aneurysm or dissection. No appreciable aortic narrowing or calcification. No appreciable  atherosclerotic plaque. Celiac: Celiac artery and its major branches appear patent. No aneurysm or dissection. SMA: Superior mesenteric artery and its branches appear patent. No aneurysm or dissection. Either renal artery or its branches. No fibromuscular dysplasia. No aneurysm or dissection on either side. Each IMA: Inferior mesenteric artery and its branches are patent. No aneurysm or dissection. Inflow: Major pelvic arterial vessels are patent as are proximal superficial femoral and profunda femoral arteries. No pelvic or upper thigh vascular atherosclerosis appreciable. No aneurysm or dissection in the major pelvic arterial vessels. Veins: No obvious venous abnormality within the limitations of this arterial phase study. Review of the MIP images confirms the above findings. NON-VASCULAR Hepatobiliary: No focal liver lesions are apparent. Gallbladder wall is not appreciably thickened. There is no biliary duct dilatation. Pancreas: No pancreatic mass or inflammatory focus. Spleen: No splenic lesions are evident.  Adrenals/Urinary Tract: Adrenals bilaterally appear unremarkable. Kidneys bilaterally show no evident mass or hydronephrosis on either side. There is no appreciable renal or ureteral calculus on either side. Urinary bladder is midline with wall thickness within normal limits. Stomach/Bowel: There is no appreciable bowel wall or mesenteric thickening. No evident bowel obstruction. No free air or portal venous air. Lymphatic: No adenopathy is apparent in the abdomen or pelvis. Reproductive: Uterus is anteverted. Uterus appears somewhat lobulated in contour, likely due to underlying leiomyomatous change. There is a mass arising from the rightward aspect of the uterus somewhat eccentrically felt to represent a leiomyoma measuring 5.5 x 4.4 x 3.6 cm. No extrauterine pelvic mass evident. Other: Appendix appears unremarkable. No abscess or ascites is evident in the abdomen or pelvis. There is a focal ventral hernia slightly superior to the umbilicus containing fat and mild changes of panniculitis. This hernia has a neck measurement from right to left of 1.2 cm and from superior to inferior dimension of 0.8 cm. No bowel containing hernia evident. Musculoskeletal: There are no blastic or lytic bone lesions. There is no intramuscular or abdominal wall lesion. Review of the MIP images confirms the above findings. IMPRESSION: CT angiogram chest: 1. No thoracic aortic aneurysm or dissection. No evident pulmonary embolus. 2. Subcutaneous defibrillator present with lead tip anterior left chest wall. 3.  No lung edema or consolidation. 4.  No evident thoracic adenopathy. CT angiogram abdomen; CT angiogram pelvis: 1. No aneurysm or dissection in the aorta or major mesenteric/pelvic arterial vessels. No appreciable atherosclerotic change noted in the arterial vessels in the abdomen or pelvis. 2. Focal ventral hernia slightly superior to the umbilicus which contains fat and mild panniculitis. No bowel containing hernia evident. 3.   Leiomyomatous uterus. 4.  No bowel obstruction.  No abscess.  No ascites. 5.  No renal or ureteral calculus.  No hydronephrosis. Electronically Signed   By: Lowella Grip III M.D.   On: 07/02/2017 12:05       Assessment & Plan:    Principal Problem:   Chest pain with moderate risk for cardiac etiology Active Problems:   Hypertensive urgency   ICD (implantable cardioverter-defibrillator) in place, [facility], 2013   UTI (urinary tract infection)   Chest pain    Chest pain Tele Trop I q6h x3 Check cardiac echo Cont aspirin, carvedilol,  Dilaudid prn chest pain Cardiology consulted , appreciate input NPO after midnite in case needs stress testing  CAD Cont aspirin cardveilol Check lipid Start Lipitor 32m po qhs  CHF (EF 25%) Cont lasix  Cont carvedilol  Hypertension uncontrolled Hydralazine 541miv q6h prn sbp >160  ? Gout  Cont prednisone  DVT Prophylaxis Heparin -  Lovenox - SCDs  AM Labs Ordered, also please review Full Orders  Family Communication: Admission, patients condition and plan of care including tests being ordered have been discussed with the patient  who indicate understanding and agree with the plan and Code Status.  Code Status FULL CODE  Likely DC to  home  Condition GUARDED    Consults called: cardiology by ED  Admission status: observation  Time spent in minutes : 45   Jani Gravel M.D on 07/02/2017 at 7:58 PM  Between 7am to 7pm - Pager - [phone]    After 7pm go to [email or web address] - password [email or web address]  [facility] - Office  [phone]

## 2017-07-02 NOTE — ED Notes (Addendum)
PT STATES UPPER BACK PAIN X 2 DAYS. LEFT CHEST TO CENTER CHEST PAIN. DENIES SHORTNESS OF BREATH. PT STATES SHE TOOK NITRO 0.1 (TABLET) AT 0700 BEFORE ARRIVAL TO ED. PT STATES IT DID NOT HELP.

## 2017-07-02 NOTE — Consult Note (Signed)
Cardiology Consultation:   Patient ID: Kathy Bruce; [id number]; 1966/04/12   Admit date: 07/02/2017 Date of Consult: 07/02/2017  Primary Care Provider: Patient, No Pcp Per Primary Cardiologist: new, Dr Debara Pickett Primary Electrophysiologist: N/A   Patient Profile:   Kathy Bruce is a 52 y.o. female with a hx of CAD (reports she had PCI with stent) in 2013, CHF with placement of AICD, SEM, and poorly controlled HTN who is being seen today for the evaluation of chest pain at the request of Dr. Alvino Chapel (Emergency Department).  History of Present Illness:   Kathy Bruce presents to the ED today for evaluation of chest pain.   She started having back pain about 3 days ago and then her chest started hurting last night. She describes the pain as stabbing pain with a pulling sensation when she moves. She ranks the pain as a 10/10. The pain is worse with movement, deep inspiration, and when lying flat. She tried taking Naproxen and Nitroglycerin for the pain with no relief.    Along with the chest pain, she reports shortness of breath, palpitations, and dizziness. She has chest wall tenderness.  She had a fever, nasal congestion/drainage, and cold sweats about 6 days ago.  In 2013, the patient states she had a mild MI and had a PCI with stent. She reports she developed CHF from the MI and had to have an AICD placed. She reports having orthopnea, PND, and lower leg edema. She wakes up with swollen legs about 1-2 times per week. She does not weigh herself regularly and she gets very little activity.  She recently moved here from [place] and has not found a Cardiologist yet. She ran out of her medications last week. The only medicine that she currently has is Aspirin 5m.  Her previous medications include Clonidine, Aspirin, Naproxen, Percocet, Carvedilol, and Lasix.   Past Medical History:  Diagnosis Date  . CHF (congestive heart failure) ([facility] 2013   Patient reports this was secondary to MI and  she to have AICD placed.   . Coronary artery disease 2013   Patient reports MI in 2013 with stent [place] outside records.   . Hypertension   . ICD [facility] 07/02/2017    Past Surgical History:  Procedure Laterality Date  . CARDIAC DEFIBRILLATOR PLACEMENT     [employer] ICD     Prior to Admission medications   Medication Sig Start Date End Date Taking? Authorizing Provider  aspirin EC 81 MG tablet Take 81 mg by mouth daily.   Yes [provider]  carvedilol (COREG) 12.5 MG tablet Take 12.5 mg by mouth 2 (two) times daily with a meal.   Yes [provider]  cloNIDine (CATAPRES) 0.2 MG tablet Take 0.1 mg by mouth daily.   Yes [provider]  colchicine 0.6 MG tablet Take 0.6 mg by mouth daily as needed (GOUT FLARE UP).   Yes [provider]  furosemide (LASIX) 20 MG tablet Take 20 mg by mouth.   Yes [provider]  naproxen (NAPROSYN) 500 MG tablet Take 500 mg by mouth 2 (two) times daily as needed for mild pain.   Yes [provider]  omeprazole (PRILOSEC) 40 MG capsule Take 40 mg by mouth daily.   Yes [provider]  oxyCODONE-acetaminophen (PERCOCET) 10-325 MG tablet Take 1 tablet by mouth 2 (two) times daily as needed for pain.   Yes [provider]  predniSONE (  DELTASONE) 10 MG tablet Take 10 mg by mouth daily with breakfast.   Yes [provider]  Tetrahydrozoline HCl (VISINE OP) Place 2 drops into both eyes daily as needed (DRY EYES).   Yes [provider]  zolpidem (AMBIEN) 10 MG tablet Take 10 mg by mouth at bedtime.   Yes [provider]    Inpatient Medications: Scheduled Meds: . iopamidol       Continuous Infusions:  PRN Meds:  Allergies:   Not on File  Social History:   Social History   Socioeconomic History  . Marital status: Married    Spouse name: Not on file  .  Number of children: Not on file  . Years of education: Not on file  . Highest education level: Not on file  Social Needs  . Financial resource strain: Not on file  . Food insecurity - worry: Not on file  . Food insecurity - inability: Not on file  . Transportation needs - medical: Not on file  . Transportation needs - non-medical: Not on file  Occupational History  . Not on file  Tobacco Use  . Smoking status: Never Smoker  . Smokeless tobacco: Never Used  Substance and Sexual Activity  . Alcohol use: Yes    Frequency: Never    Comment: Occasionally  . Drug use: No  . Sexual activity: Yes  Other Topics Concern  . Not on file  Social History Narrative   Recently moved to [place] from [place] in October 2018. Lives in an apartment with her husband.    Family History:   Family History  Problem Relation Age of Onset  . Hyperthyroidism Mother   . Diabetes Mother   . Thyroid disease Brother    Family Status:  Family Status  Relation Name Status  . Mother  Deceased at age 75       MI in 79  . Father  Deceased at age 53       TB  . Brother  Deceased at age 62       MI    ROS:  Please see the history of present illness.  All other ROS reviewed and negative.     Physical Exam/Data:   Vitals:   07/02/17 1416 07/02/17 1430 07/02/17 1515 07/02/17 1530  BP:  (!) 126/96 (!) 137/95 (!) 131/96  Pulse: 93 94 94 94  Resp: 14 15 (!) 21 16  Temp:      TempSrc:      SpO2: 99% 99% 99% 99%  Weight:      Height:       No intake or output data in the 24 hours ending 07/02/17 1617 Filed Weights   07/02/17 1006  Weight: 183 lb (83 kg)   Body mass index is 33.47 kg/m.  General:  52 y.o. African-American female well nourished, well developed, sitting comfortably in bed in no acute distress HEENT: normal Lymph: no adenopathy Neck: no JVD Endocrine:  No thryomegaly Vascular: No carotid bruits; 4/4 extremity pulses 2+  Cardiac:  normal S1, S2; tachycardia with  regular rhythm; 2/6 systolic murmur; chest wall tenderness in mid chest Lungs:  clear to auscultation bilaterally, no wheezing, rhonchi or rales, pain with deep inspiration   Abd: soft, nontender, no hepatomegaly  Ext: no edema Musculoskeletal:  No deformities, BUE and BLE strength normal and equal Skin: warm and dry  Neuro:  CNs 2-12 intact, no focal abnormalities noted Psych:  Normal affect   EKG:  The EKG  was personally reviewed and demonstrates:  Sinus tachycardia with ventricular rate of 101, LVH (?), no T wave inversions, ST depressions, or ST elevations Telemetry:  Telemetry was personally reviewed and demonstrates:  SR, ST  Relevant CV Studies: studies done in [place], no data available  ECHO:  CATH:   Laboratory Data:  Chemistry Recent Labs  Lab 07/02/17 1007  NA 141  K 3.8  CL 107  GLUCOSE 102*  BUN 16  CREATININE 1.20*     Hematology Recent Labs  Lab 07/02/17 1000 07/02/17 1007  WBC 6.6  --   RBC 4.06  --   HGB 12.3 12.6  HCT 37.1 37.0  MCV 91.4  --   MCH 30.3  --   MCHC 33.2  --   RDW 14.7  --   PLT 270  --    Cardiac EnzymesNo results for input(s): TROPONINI in the last 168 hours.  Recent Labs  Lab 07/02/17 1006  TROPIPOC 0.00     Radiology/Studies:  Dg Chest Portable 1 View  Result Date: 07/02/2017 CLINICAL DATA:  Back pain for 2 days and chest pain starting yesterday. EXAM: PORTABLE CHEST 1 VIEW COMPARISON:  None. FINDINGS: Subcutaneous implantable cardioverter-defibrillator noted. Mild enlargement of the cardiopericardial silhouette. The lungs appear clear. No pleural effusion identified. IMPRESSION: 1. Mild enlargement of the cardiopericardial silhouette, without edema. 2. Subcutaneous implantable cardioverter-defibrillator noted. Electronically Signed   By: Van Clines M.D.   On: 07/02/2017 10:58   Ct Angio Chest/abd/pel For Dissection W And/or Wo Contrast  Result Date: 07/02/2017 CLINICAL DATA:  Chest and abdominal pain EXAM: CT  ANGIOGRAPHY CHEST, ABDOMEN AND PELVIS TECHNIQUE: Initially, axial CT images were obtained through the chest without intravenous contrast material administration. Multidetector CT imaging through the chest, abdomen and pelvis was performed using the standard protocol during bolus administration of intravenous contrast. Multiplanar reconstructed images and MIPs were obtained and reviewed to evaluate the vascular anatomy. CONTRAST:  137m ISOVUE-370 IOPAMIDOL (ISOVUE-370) INJECTION 76% COMPARISON:  Chest radiograph July 02, 2017 FINDINGS: CTA CHEST FINDINGS Cardiovascular: There is no intramural hematoma in the thoracic aorta on the noncontrast enhanced study. There is no thoracic aortic aneurysm or dissection. The visualized great vessels appear unremarkable. Note that the right and left common carotid arteries arise as a common trunk, an anatomic variant. There is no appreciable pericardial thickening or pericardial effusion. There is no demonstrable pulmonary embolus. A defibrillator is present on the left with the defibrillator lead tip in the soft tissues anteriorly on the left. Mediastinum/Nodes: Visualized thyroid appears normal. No adenopathy is evident in the thoracic region by size criteria. There are scattered subcentimeter axillary lymph nodes, regarded as nonspecific. No esophageal lesions are evident. Lungs/Pleura: There is no lung edema or consolidation. No pleural effusion or pleural thickening evident. Musculoskeletal: There are no blastic or lytic bone lesions. Review of the MIP images confirms the above findings. CTA ABDOMEN AND PELVIS FINDINGS VASCULAR Aorta: No appreciable thoracic aortic aneurysm or dissection. No appreciable aortic narrowing or calcification. No appreciable atherosclerotic plaque. Celiac: Celiac artery and its major branches appear patent. No aneurysm or dissection. SMA: Superior mesenteric artery and its branches appear patent. No aneurysm or dissection. Either renal artery  or its branches. No fibromuscular dysplasia. No aneurysm or dissection on either side. Each IMA: Inferior mesenteric artery and its branches are patent. No aneurysm or dissection. Inflow: Major pelvic arterial vessels are patent as are proximal superficial femoral and profunda femoral arteries. No pelvic or upper thigh vascular atherosclerosis appreciable. No  aneurysm or dissection in the major pelvic arterial vessels. Veins: No obvious venous abnormality within the limitations of this arterial phase study. Review of the MIP images confirms the above findings. NON-VASCULAR Hepatobiliary: No focal liver lesions are apparent. Gallbladder wall is not appreciably thickened. There is no biliary duct dilatation. Pancreas: No pancreatic mass or inflammatory focus. Spleen: No splenic lesions are evident. Adrenals/Urinary Tract: Adrenals bilaterally appear unremarkable. Kidneys bilaterally show no evident mass or hydronephrosis on either side. There is no appreciable renal or ureteral calculus on either side. Urinary bladder is midline with wall thickness within normal limits. Stomach/Bowel: There is no appreciable bowel wall or mesenteric thickening. No evident bowel obstruction. No free air or portal venous air. Lymphatic: No adenopathy is apparent in the abdomen or pelvis. Reproductive: Uterus is anteverted. Uterus appears somewhat lobulated in contour, likely due to underlying leiomyomatous change. There is a mass arising from the rightward aspect of the uterus somewhat eccentrically felt to represent a leiomyoma measuring 5.5 x 4.4 x 3.6 cm. No extrauterine pelvic mass evident. Other: Appendix appears unremarkable. No abscess or ascites is evident in the abdomen or pelvis. There is a focal ventral hernia slightly superior to the umbilicus containing fat and mild changes of panniculitis. This hernia has a neck measurement from right to left of 1.2 cm and from superior to inferior dimension of 0.8 cm. No bowel containing  hernia evident. Musculoskeletal: There are no blastic or lytic bone lesions. There is no intramuscular or abdominal wall lesion. Review of the MIP images confirms the above findings. IMPRESSION: CT angiogram chest: 1. No thoracic aortic aneurysm or dissection. No evident pulmonary embolus. 2. Subcutaneous defibrillator present with lead tip anterior left chest wall. 3.  No lung edema or consolidation. 4.  No evident thoracic adenopathy. CT angiogram abdomen; CT angiogram pelvis: 1. No aneurysm or dissection in the aorta or major mesenteric/pelvic arterial vessels. No appreciable atherosclerotic change noted in the arterial vessels in the abdomen or pelvis. 2. Focal ventral hernia slightly superior to the umbilicus which contains fat and mild panniculitis. No bowel containing hernia evident. 3.  Leiomyomatous uterus. 4.  No bowel obstruction.  No abscess.  No ascites. 5.  No renal or ureteral calculus.  No hydronephrosis. Electronically Signed   By: Lowella Grip III M.D.   On: 07/02/2017 12:05    Assessment and Plan:    Active Problems:   Hypertensive urgency   Chest pain with moderate risk for cardiac etiology   ICD (implantable cardioverter-defibrillator) in place, [facility], 2013  1. Chest Pain - EKG showed no acute ST changes. I-stat troponin negative. Continue serial EKGs and troponins. - Suspect chest pain is secondary to longstanding HTN, CHF - pleuritic chest pain, ?possible pericarditis given history (worse with movement, deep inspiration, and lying supine; + chest wall tenderness; recent viral illness). - will ck ESR, but no rub on exam and ECG not c/w this. - CTA showed no aortic dissection and no obvious PE -pain management per IM, consider Gabapentin  2. HTN - Longstanding history of uncontrolled HTN. Patient states her BP is normally 180s/100s with medicine (Clonidine and Carvedilol). - BP in ED was 230/130. Most recent BP 131/96. - restart rx to reduce BP gradually over 24-48  hours and uptitrate, but can do some as outpatient since her BP has never been well-controlled  3. CHF - Patient reports she was diagnosed with CHF following an MI in 2013 and had to have AICD placed. Patient recently moved here from [place].  Will need to get outside records if possible. - Ordered Echo. - needs aggressive therapy if EF is low (probable) with ACE/ARB/Entresto, spiro, diuretic - daily wts and low Na diet  4. CAD - Patient reports she had a MI in 2013 and had a stent placed. Will work on getting outside records.  - Pt should be on ASA, BB, statin  5. Compliance - encourage compliance w/ rx, try to limit costs  For questions or updates, please contact [place] Please consult [email or web address] for contact info under Cardiology/STEMI.   Signed, Sande Rives, Student PA Rosaria Ferries, PA-C  07/02/2017 4:17 PM

## 2017-07-02 NOTE — Progress Notes (Signed)
Patient approved spouse to be in room during admission process questioning

## 2017-07-02 NOTE — ED Notes (Signed)
REQUESTED TRIAGE TO INPUT VITALS TAKEN FROM TRIAGE. ROBERT B. EMT VERBALIZED HE WOULD.

## 2017-07-02 NOTE — ED Notes (Signed)
Attempt report x1  

## 2017-07-02 NOTE — ED Provider Notes (Signed)
[facility] DEPT Provider Note   CSN: [id number] Arrival date & time: 07/02/17  7425     History   Chief Complaint Chief Complaint  Patient presents with  . Chest Pain    HPI Kathy Bruce is a 52 y.o. female.  HPI Patient presents with chest pain.  Began last night.  Starts on her anterior chest and goes through to her back.  History of reported mild heart attack and heart failure.  Has AICD.  Recently moved down from [place].  States is been out of her medicines for the last week.  States she does have some numbness in her left arm.  No headache.  Pain is dull.  Worse with movements. Past Medical History:  Diagnosis Date  . CHF (congestive heart failure) ([place])   . Coronary artery disease   . Hypertension     There are no active problems to display for this patient.   Past Surgical History:  Procedure Laterality Date  . CARDIAC DEFIBRILLATOR PLACEMENT    . CARDIAC DEFIBRILLATOR PLACEMENT      OB History    No data available       Home Medications    Prior to Admission medications   Medication Sig Start Date End Date Taking? Authorizing Provider  aspirin EC 81 MG tablet Take 81 mg by mouth daily.   Yes [provider]  carvedilol (COREG) 12.5 MG tablet Take 12.5 mg by mouth 2 (two) times daily with a meal.   Yes [provider]  cloNIDine (CATAPRES) 0.2 MG tablet Take 0.1 mg by mouth daily.   Yes [provider]  colchicine 0.6 MG tablet Take 0.6 mg by mouth daily as needed (GOUT FLARE UP).   Yes [provider]  furosemide (LASIX) 20 MG tablet Take 20 mg by mouth.   Yes [provider]  naproxen (NAPROSYN) 500 MG tablet Take 500 mg by mouth 2 (two) times daily as needed for mild pain.   Yes [provider]  omeprazole (PRILOSEC) 40 MG capsule Take 40 mg by mouth daily.   Yes [provider]  oxyCODONE-acetaminophen (PERCOCET) 10-325 MG tablet Take 1 tablet by mouth 2  (two) times daily as needed for pain.   Yes [provider]  predniSONE (DELTASONE) 10 MG tablet Take 10 mg by mouth daily with breakfast.   Yes [provider]  Tetrahydrozoline HCl (VISINE OP) Place 2 drops into both eyes daily as needed (DRY EYES).   Yes [provider]  zolpidem (AMBIEN) 10 MG tablet Take 10 mg by mouth at bedtime.   Yes [provider]    Family History No family history on file.  Social History Social History   Tobacco Use  . Smoking status: Never Smoker  . Smokeless tobacco: Never Used  Substance Use Topics  . Alcohol use: No    Frequency: Never  . Drug use: No     Allergies   Patient has no allergy information on record.   Review of Systems Review of Systems  Constitutional: Negative for appetite change, fatigue and fever.  HENT: Negative for congestion.   Respiratory: Positive for chest tightness.   Cardiovascular: Positive for chest pain. Negative for leg swelling.  Gastrointestinal: Negative for abdominal pain.  Genitourinary: Negative for flank pain.  Musculoskeletal: Positive for back pain.  Skin: Negative for rash.  Neurological: Positive for numbness.  Hematological: Negative for adenopathy.  Psychiatric/Behavioral: Negative for agitation.     Physical Exam  Updated Vital Signs BP (!) 176/109   Pulse 97   Temp 98 F (36.7 C) (Oral)   Resp (!) 24   Ht 5\' 2"  (1.575 m)   Wt 83 kg (183 lb)   SpO2 100%   BMI 33.47 kg/m   Physical Exam  Constitutional: She appears well-developed.  HENT:  Head: Atraumatic.  Neck: Neck supple.  Cardiovascular: Regular rhythm.  No murmur heard. Mild tachycardia  Pulmonary/Chest: Effort normal. She has no wheezes. She has no rhonchi. She has no rales.  Abdominal: Soft.  Musculoskeletal:       Right lower leg: She exhibits no edema.  Neurological: She is alert.  Skin: Skin is warm. Capillary refill takes less than 2 seconds.     ED Treatments / Results    Labs (all labs ordered are listed, but only abnormal results are displayed) Labs Reviewed  I-STAT CHEM 8, ED - Abnormal; Notable for the following components:      Result Value   Creatinine, Ser 1.20 (*)    Glucose, Bld 102 (*)    All other components within normal limits  CBC WITH DIFFERENTIAL/PLATELET  I-STAT TROPONIN, ED    EKG  EKG Interpretation  Date/Time:  Thursday July 02 2017 09:39:39 EST Ventricular Rate:  101 PR Interval:    QRS Duration: 100 QT Interval:  360 QTC Calculation: 467 R Axis:   46 Text Interpretation:  Sinus tachycardia Probable LVH with secondary repol abnrm Confirmed by Davonna Belling [phone]) on 07/02/2017 9:45:53 AM       Radiology Dg Chest Portable 1 View  Result Date: 07/02/2017 CLINICAL DATA:  Back pain for 2 days and chest pain starting yesterday. EXAM: PORTABLE CHEST 1 VIEW COMPARISON:  None. FINDINGS: Subcutaneous implantable cardioverter-defibrillator noted. Mild enlargement of the cardiopericardial silhouette. The lungs appear clear. No pleural effusion identified. IMPRESSION: 1. Mild enlargement of the cardiopericardial silhouette, without edema. 2. Subcutaneous implantable cardioverter-defibrillator noted. Electronically Signed   By: Van Clines M.D.   On: 07/02/2017 10:58   Ct Angio Chest/abd/pel For Dissection W And/or Wo Contrast  Result Date: 07/02/2017 CLINICAL DATA:  Chest and abdominal pain EXAM: CT ANGIOGRAPHY CHEST, ABDOMEN AND PELVIS TECHNIQUE: Initially, axial CT images were obtained through the chest without intravenous contrast material administration. Multidetector CT imaging through the chest, abdomen and pelvis was performed using the standard protocol during bolus administration of intravenous contrast. Multiplanar reconstructed images and MIPs were obtained and reviewed to evaluate the vascular anatomy. CONTRAST:  175mL ISOVUE-370 IOPAMIDOL (ISOVUE-370) INJECTION 76% COMPARISON:  Chest radiograph July 02, 2017  FINDINGS: CTA CHEST FINDINGS Cardiovascular: There is no intramural hematoma in the thoracic aorta on the noncontrast enhanced study. There is no thoracic aortic aneurysm or dissection. The visualized great vessels appear unremarkable. Note that the right and left common carotid arteries arise as a common trunk, an anatomic variant. There is no appreciable pericardial thickening or pericardial effusion. There is no demonstrable pulmonary embolus. A defibrillator is present on the left with the defibrillator lead tip in the soft tissues anteriorly on the left. Mediastinum/Nodes: Visualized thyroid appears normal. No adenopathy is evident in the thoracic region by size criteria. There are scattered subcentimeter axillary lymph nodes, regarded as nonspecific. No esophageal lesions are evident. Lungs/Pleura: There is no lung edema or consolidation. No pleural effusion or pleural thickening evident. Musculoskeletal: There are no blastic or lytic bone lesions. Review of the MIP images confirms the above findings. CTA ABDOMEN AND PELVIS FINDINGS VASCULAR Aorta: No appreciable thoracic aortic aneurysm  or dissection. No appreciable aortic narrowing or calcification. No appreciable atherosclerotic plaque. Celiac: Celiac artery and its major branches appear patent. No aneurysm or dissection. SMA: Superior mesenteric artery and its branches appear patent. No aneurysm or dissection. Either renal artery or its branches. No fibromuscular dysplasia. No aneurysm or dissection on either side. Each IMA: Inferior mesenteric artery and its branches are patent. No aneurysm or dissection. Inflow: Major pelvic arterial vessels are patent as are proximal superficial femoral and profunda femoral arteries. No pelvic or upper thigh vascular atherosclerosis appreciable. No aneurysm or dissection in the major pelvic arterial vessels. Veins: No obvious venous abnormality within the limitations of this arterial phase study. Review of the MIP  images confirms the above findings. NON-VASCULAR Hepatobiliary: No focal liver lesions are apparent. Gallbladder wall is not appreciably thickened. There is no biliary duct dilatation. Pancreas: No pancreatic mass or inflammatory focus. Spleen: No splenic lesions are evident. Adrenals/Urinary Tract: Adrenals bilaterally appear unremarkable. Kidneys bilaterally show no evident mass or hydronephrosis on either side. There is no appreciable renal or ureteral calculus on either side. Urinary bladder is midline with wall thickness within normal limits. Stomach/Bowel: There is no appreciable bowel wall or mesenteric thickening. No evident bowel obstruction. No free air or portal venous air. Lymphatic: No adenopathy is apparent in the abdomen or pelvis. Reproductive: Uterus is anteverted. Uterus appears somewhat lobulated in contour, likely due to underlying leiomyomatous change. There is a mass arising from the rightward aspect of the uterus somewhat eccentrically felt to represent a leiomyoma measuring 5.5 x 4.4 x 3.6 cm. No extrauterine pelvic mass evident. Other: Appendix appears unremarkable. No abscess or ascites is evident in the abdomen or pelvis. There is a focal ventral hernia slightly superior to the umbilicus containing fat and mild changes of panniculitis. This hernia has a neck measurement from right to left of 1.2 cm and from superior to inferior dimension of 0.8 cm. No bowel containing hernia evident. Musculoskeletal: There are no blastic or lytic bone lesions. There is no intramuscular or abdominal wall lesion. Review of the MIP images confirms the above findings. IMPRESSION: CT angiogram chest: 1. No thoracic aortic aneurysm or dissection. No evident pulmonary embolus. 2. Subcutaneous defibrillator present with lead tip anterior left chest wall. 3.  No lung edema or consolidation. 4.  No evident thoracic adenopathy. CT angiogram abdomen; CT angiogram pelvis: 1. No aneurysm or dissection in the aorta or  major mesenteric/pelvic arterial vessels. No appreciable atherosclerotic change noted in the arterial vessels in the abdomen or pelvis. 2. Focal ventral hernia slightly superior to the umbilicus which contains fat and mild panniculitis. No bowel containing hernia evident. 3.  Leiomyomatous uterus. 4.  No bowel obstruction.  No abscess.  No ascites. 5.  No renal or ureteral calculus.  No hydronephrosis. Electronically Signed   By: Lowella Grip III M.D.   On: 07/02/2017 12:05    Procedures Procedures (including critical care time)  Medications Ordered in ED Medications  iopamidol (ISOVUE-370) 76 % injection (not administered)  cloNIDine (CATAPRES) tablet 0.2 mg (not administered)  nitroGLYCERIN (NITROSTAT) SL tablet 0.4 mg (not administered)  iopamidol (ISOVUE-370) 76 % injection 100 mL (100 mLs Intravenous Contrast Given 07/02/17 1129)     Initial Impression / Assessment and Plan / ED Course  I have reviewed the triage vital signs and the nursing notes.  Pertinent labs & imaging results that were available during my care of the patient were reviewed by me and considered in my medical decision making (see  chart for details).     Patient presents with chest pain.  Has had for the last couple days.  Hypertensive and has been off her medications.  Reported history of heart attack and has an AICD.  However her primary treatment has been in [place].  Does not have a doctor in [place] yet.  EKG reassuring.  With chest pain radiating to the back and hypertension CT angiography done and showed no aortic dissection.  Will admit to cardiology.  Final Clinical Impressions(s) / ED Diagnoses   Final diagnoses:  Chest pain, unspecified type  Noncompliance with medication regimen    ED Discharge Orders    None       Davonna Belling, MD 07/02/17 1335

## 2017-07-02 NOTE — ED Notes (Signed)
MADE AWARE OF ROOM ASSIGNMENT [facility]

## 2017-07-02 NOTE — ED Notes (Signed)
ED Provider at bedside. [name] 

## 2017-07-02 NOTE — ED Notes (Signed)
PT TALKING ON CELL PHONE. NO CHANGE IN STATUS

## 2017-07-02 NOTE — ED Notes (Signed)
2nd NITRO 0.4MG  GIVEN. BACK PAIN HAS IMPROVED. CHEST PAIN THE SAME.

## 2017-07-02 NOTE — ED Notes (Signed)
CARDIO Provider at bedside.

## 2017-07-02 NOTE — ED Notes (Signed)
ED Provider at bedside. [name] EDP PRESENT UPON PT'S ARRIVAL TO ROOM

## 2017-07-02 NOTE — ED Notes (Signed)
PT MADE AWARE THAT CARDIO WILL NOT BE ADMITTING AND HOSPITALIST WILL NEED TO ADMIT. EDP J MADE AWARE OF PT IN ED. APOLOGIZED TO PATIENT FOR MISUNDERSTANDING ABOUT ADMISSION. INFORMED PT WOULD MAKE ALL ATTEMPTS TO GET PT PAIN MEDICATIONS. CARDIO AND EDP [name] WAS AWARE OF PT'S BACK PAIN 10/10. I SPOKE WITH CARDIO AND INFORMED I WAS CARING FOR THIS PT AND WAS THERE ANYTHING PENDING. CARDIO AND EDP REPLIED NOT AT THIS TIME.

## 2017-07-03 ENCOUNTER — Observation Stay (HOSPITAL_BASED_OUTPATIENT_CLINIC_OR_DEPARTMENT_OTHER): Payer: Medicaid Other

## 2017-07-03 DIAGNOSIS — I160 Hypertensive urgency: Secondary | ICD-10-CM | POA: Diagnosis not present

## 2017-07-03 DIAGNOSIS — R079 Chest pain, unspecified: Secondary | ICD-10-CM | POA: Diagnosis not present

## 2017-07-03 DIAGNOSIS — Z95810 Presence of automatic (implantable) cardiac defibrillator: Secondary | ICD-10-CM

## 2017-07-03 DIAGNOSIS — I319 Disease of pericardium, unspecified: Secondary | ICD-10-CM | POA: Diagnosis not present

## 2017-07-03 LAB — COMPREHENSIVE METABOLIC PANEL
ALBUMIN: 3.8 g/dL (ref 3.5–5.0)
ALT: 22 U/L (ref 14–54)
ANION GAP: 11 (ref 5–15)
AST: 31 U/L (ref 15–41)
Alkaline Phosphatase: 90 U/L (ref 38–126)
BILIRUBIN TOTAL: 0.6 mg/dL (ref 0.3–1.2)
BUN: 12 mg/dL (ref 6–20)
CO2: 24 mmol/L (ref 22–32)
Calcium: 9.8 mg/dL (ref 8.9–10.3)
Chloride: 103 mmol/L (ref 101–111)
Creatinine, Ser: 1.19 mg/dL — ABNORMAL HIGH (ref 0.44–1.00)
GFR calc non Af Amer: 52 mL/min — ABNORMAL LOW (ref >60)
GLUCOSE: 155 mg/dL — AB (ref 65–99)
Potassium: 3.4 mmol/L — ABNORMAL LOW (ref 3.5–5.1)
SODIUM: 138 mmol/L (ref 135–145)
TOTAL PROTEIN: 7.9 g/dL (ref 6.5–8.1)

## 2017-07-03 LAB — LIPID PANEL
CHOL/HDL RATIO: 4.3 ratio
CHOLESTEROL: 256 mg/dL — AB (ref 0–200)
HDL: 59 mg/dL (ref >40)
LDL Cholesterol: 160 mg/dL — ABNORMAL HIGH (ref 0–99)
TRIGLYCERIDES: 187 mg/dL — AB (ref <150)
VLDL: 37 mg/dL (ref 0–40)

## 2017-07-03 LAB — ECHOCARDIOGRAM COMPLETE
Ao-asc: 29 cm
E decel time: 162 msec
FS: 13 % — AB (ref 28–44)
HEIGHTINCHES: 62 in
IVS/LV PW RATIO, ED: 0.75
LA ID, A-P, ES: 39 mm
LA vol index: 29 mL/m2
LA vol: 56.7 mL
LADIAMINDEX: 2 cm/m2
LAVOLA4C: 51 mL
LEFT ATRIUM END SYS DIAM: 39 mm
LVOT area: 2.84 cm2
LVOT diameter: 19 mm
MV Dec: 162
MVPKEVEL: 1.3 m/s
PW: 12 mm — AB (ref 0.6–1.1)
RV TAPSE: 21.9 mm
Weight: 2959.46 oz

## 2017-07-03 LAB — CBC
HEMATOCRIT: 34.6 % — AB (ref 36.0–46.0)
HEMOGLOBIN: 11.3 g/dL — AB (ref 12.0–15.0)
MCH: 29.9 pg (ref 26.0–34.0)
MCHC: 32.7 g/dL (ref 30.0–36.0)
MCV: 91.5 fL (ref 78.0–100.0)
Platelets: 255 10*3/uL (ref 150–400)
RBC: 3.78 MIL/uL — AB (ref 3.87–5.11)
RDW: 15 % (ref 11.5–15.5)
WBC: 7.3 10*3/uL (ref 4.0–10.5)

## 2017-07-03 LAB — TROPONIN I: Troponin I: 0.03 ng/mL (ref <0.03)

## 2017-07-03 LAB — HIV ANTIBODY (ROUTINE TESTING W REFLEX): HIV Screen 4th Generation wRfx: NONREACTIVE

## 2017-07-03 MED ORDER — PREDNISONE 10 MG PO TABS: 10.0000 mg | ORAL_TABLET | Freq: Every day | ORAL | Status: DC

## 2017-07-03 MED ORDER — COLCHICINE 0.6 MG PO TABS
0.6000 mg | ORAL_TABLET | Freq: Two times a day (BID) | ORAL | Status: DC
  Administered 2017-07-03 – 2017-07-05 (×5): 0.6 mg via ORAL
  Filled 2017-07-03 (×5): qty 1

## 2017-07-03 MED ORDER — PREDNISONE 50 MG PO TABS
60.0000 mg | ORAL_TABLET | Freq: Every day | ORAL | Status: DC
  Administered 2017-07-04: 08:00:00 60 mg via ORAL
  Filled 2017-07-03: qty 1

## 2017-07-03 MED ORDER — POTASSIUM CHLORIDE CRYS ER 20 MEQ PO TBCR
40.0000 meq | EXTENDED_RELEASE_TABLET | Freq: Once | ORAL | Status: AC
  Administered 2017-07-03: 40 meq via ORAL
  Filled 2017-07-03: qty 2

## 2017-07-03 MED ORDER — PREDNISONE 50 MG PO TABS
50.0000 mg | ORAL_TABLET | Freq: Once | ORAL | Status: AC
  Administered 2017-07-03: 50 mg via ORAL
  Filled 2017-07-03: qty 1

## 2017-07-03 NOTE — Progress Notes (Signed)
CRITICAL VALUE ALERT  Critical Value:  Troponin 0.03  Date & Time Notied: 07/03/17  0355  Provider Notified: Silas [place]   Orders Received/Actions taken: will continue to monitor

## 2017-07-03 NOTE — Progress Notes (Signed)
[facility]  PROGRESS NOTE  Cathrine Krizan UDJ:[id number] DOB: 1965-10-19 DOA: 07/02/2017 PCP: Patient, No Pcp Per   Brief HPI:   52 y.o. female, CAD, CHF (EF 25 approx), s/p ICD, 2013, hypertension, apparently c/o chest pain since last nite. Tightness,  Substernal, took 2 slg nitro without relief.  Apparently she has had upper back pain for the past 3 days.  Pt had some kind of viral illness over the weekend.      Subjective   Patient seen and examined, still complains of chest wall pain.   Assessment/Plan:     1. Chest pain- likely musculoskeletal vs pericarditis. Patient started on colchicine and prednisone. No further cardiac testing recommended by cardiology at this time. ESR elevated at 62 MM per hour. Follow echocardiogram. 2. Hypertension-continue clonidine, hydralazine, endured 3. CAD-patient has history of CAD with stent placement in 2013. Continue aspirin, Coreg, atorvastatin. 4. CHF- echocardiogram has been obtained. Will follow the results. Continue furosemide 20 mg PO daily. 5. History of gout-continue prednisone    DVT prophylaxis: Lovenox  Code Status: Full code  Family Communication: Discussed with family member at bedside  Disposition Plan: Home in am   Consultants:  Cardiology   Procedures:  None   Continuous infusions . sodium chloride        Antibiotics:   Anti-infectives (From admission, onward)   None       Objective   Vitals:   07/02/17 2355 07/03/17 0500 07/03/17 0540 07/03/17 0645  BP: (!) 148/89  (!) 173/107 124/81  Pulse:   (!) 107   Resp:   18   Temp:   98.5 F (36.9 C)   TempSrc:   Oral   SpO2:   99%   Weight:  83.9 kg (184 lb 15.5 oz)    Height:       No intake or output data in the 24 hours ending 07/03/17 1306 Filed Weights   07/02/17 1006 07/02/17 2047 07/03/17 0500  Weight: 83 kg (183 lb) 84.1 kg (185 lb 6.5 oz) 83.9 kg (184 lb 15.5 oz)     Physical Examination:   Physical Exam: Eyes: No  icterus, extraocular muscles intact  Mouth: Oral mucosa is moist, no lesions on palate,  Neck: Supple, no deformities, masses, or tenderness Lungs: Normal respiratory effort, bilateral clear to auscultation, no crackles or wheezes.  Heart: Regular rate and rhythm, S1 and S2 normal, no murmurs, rubs auscultated. Positive chest wall tenderness to palpation Abdomen: BS normoactive,soft,nondistended,non-tender to palpation,no organomegaly Extremities: No pretibial edema, no erythema, no cyanosis, no clubbing Neuro : Alert and oriented to time, place and person, No focal deficits Skin: No rashes seen on exam    Data Reviewed: I have personally reviewed following labs and imaging studies  CBG: No results for input(s): GLUCAP in the last 168 hours.  CBC: Recent Labs  Lab 07/02/17 1000 07/02/17 1007 07/03/17 0239  WBC 6.6  --  7.3  NEUTROABS 3.0  --   --   HGB 12.3 12.6 11.3*  HCT 37.1 37.0 34.6*  MCV 91.4  --  91.5  PLT 270  --  588    Basic Metabolic Panel: Recent Labs  Lab 07/02/17 1007 07/03/17 0239  NA 141 138  K 3.8 3.4*  CL 107 103  CO2  --  24  GLUCOSE 102* 155*  BUN 16 12  CREATININE 1.20* 1.19*  CALCIUM  --  9.8    No results found for this or any previous visit (from the past  240 hour(s)).   Liver Function Tests: Recent Labs  Lab 07/03/17 0239  AST 31  ALT 22  ALKPHOS 90  BILITOT 0.6  PROT 7.9  ALBUMIN 3.8   No results for input(s): LIPASE, AMYLASE in the last 168 hours. No results for input(s): AMMONIA in the last 168 hours.  Cardiac Enzymes: Recent Labs  Lab 07/02/17 2047 07/03/17 0239 07/03/17 0931  TROPONINI <0.03 0.03* <0.03   BNP (last 3 results) No results for input(s): BNP in the last 8760 hours.  ProBNP (last 3 results) No results for input(s): PROBNP in the last 8760 hours.    Studies: Dg Chest Portable 1 View  Result Date: 07/02/2017 CLINICAL DATA:  Back pain for 2 days and chest pain starting yesterday. EXAM: PORTABLE  CHEST 1 VIEW COMPARISON:  None. FINDINGS: Subcutaneous implantable cardioverter-defibrillator noted. Mild enlargement of the cardiopericardial silhouette. The lungs appear clear. No pleural effusion identified. IMPRESSION: 1. Mild enlargement of the cardiopericardial silhouette, without edema. 2. Subcutaneous implantable cardioverter-defibrillator noted. Electronically Signed   By: Van Clines M.D.   On: 07/02/2017 10:58   Ct Angio Chest/abd/pel For Dissection W And/or Wo Contrast  Result Date: 07/02/2017 CLINICAL DATA:  Chest and abdominal pain EXAM: CT ANGIOGRAPHY CHEST, ABDOMEN AND PELVIS TECHNIQUE: Initially, axial CT images were obtained through the chest without intravenous contrast material administration. Multidetector CT imaging through the chest, abdomen and pelvis was performed using the standard protocol during bolus administration of intravenous contrast. Multiplanar reconstructed images and MIPs were obtained and reviewed to evaluate the vascular anatomy. CONTRAST:  171m ISOVUE-370 IOPAMIDOL (ISOVUE-370) INJECTION 76% COMPARISON:  Chest radiograph July 02, 2017 FINDINGS: CTA CHEST FINDINGS Cardiovascular: There is no intramural hematoma in the thoracic aorta on the noncontrast enhanced study. There is no thoracic aortic aneurysm or dissection. The visualized great vessels appear unremarkable. Note that the right and left common carotid arteries arise as a common trunk, an anatomic variant. There is no appreciable pericardial thickening or pericardial effusion. There is no demonstrable pulmonary embolus. A defibrillator is present on the left with the defibrillator lead tip in the soft tissues anteriorly on the left. Mediastinum/Nodes: Visualized thyroid appears normal. No adenopathy is evident in the thoracic region by size criteria. There are scattered subcentimeter axillary lymph nodes, regarded as nonspecific. No esophageal lesions are evident. Lungs/Pleura: There is no lung edema or  consolidation. No pleural effusion or pleural thickening evident. Musculoskeletal: There are no blastic or lytic bone lesions. Review of the MIP images confirms the above findings. CTA ABDOMEN AND PELVIS FINDINGS VASCULAR Aorta: No appreciable thoracic aortic aneurysm or dissection. No appreciable aortic narrowing or calcification. No appreciable atherosclerotic plaque. Celiac: Celiac artery and its major branches appear patent. No aneurysm or dissection. SMA: Superior mesenteric artery and its branches appear patent. No aneurysm or dissection. Either renal artery or its branches. No fibromuscular dysplasia. No aneurysm or dissection on either side. Each IMA: Inferior mesenteric artery and its branches are patent. No aneurysm or dissection. Inflow: Major pelvic arterial vessels are patent as are proximal superficial femoral and profunda femoral arteries. No pelvic or upper thigh vascular atherosclerosis appreciable. No aneurysm or dissection in the major pelvic arterial vessels. Veins: No obvious venous abnormality within the limitations of this arterial phase study. Review of the MIP images confirms the above findings. NON-VASCULAR Hepatobiliary: No focal liver lesions are apparent. Gallbladder wall is not appreciably thickened. There is no biliary duct dilatation. Pancreas: No pancreatic mass or inflammatory focus. Spleen: No splenic lesions are evident. Adrenals/Urinary  Tract: Adrenals bilaterally appear unremarkable. Kidneys bilaterally show no evident mass or hydronephrosis on either side. There is no appreciable renal or ureteral calculus on either side. Urinary bladder is midline with wall thickness within normal limits. Stomach/Bowel: There is no appreciable bowel wall or mesenteric thickening. No evident bowel obstruction. No free air or portal venous air. Lymphatic: No adenopathy is apparent in the abdomen or pelvis. Reproductive: Uterus is anteverted. Uterus appears somewhat lobulated in contour, likely  due to underlying leiomyomatous change. There is a mass arising from the rightward aspect of the uterus somewhat eccentrically felt to represent a leiomyoma measuring 5.5 x 4.4 x 3.6 cm. No extrauterine pelvic mass evident. Other: Appendix appears unremarkable. No abscess or ascites is evident in the abdomen or pelvis. There is a focal ventral hernia slightly superior to the umbilicus containing fat and mild changes of panniculitis. This hernia has a neck measurement from right to left of 1.2 cm and from superior to inferior dimension of 0.8 cm. No bowel containing hernia evident. Musculoskeletal: There are no blastic or lytic bone lesions. There is no intramuscular or abdominal wall lesion. Review of the MIP images confirms the above findings. IMPRESSION: CT angiogram chest: 1. No thoracic aortic aneurysm or dissection. No evident pulmonary embolus. 2. Subcutaneous defibrillator present with lead tip anterior left chest wall. 3.  No lung edema or consolidation. 4.  No evident thoracic adenopathy. CT angiogram abdomen; CT angiogram pelvis: 1. No aneurysm or dissection in the aorta or major mesenteric/pelvic arterial vessels. No appreciable atherosclerotic change noted in the arterial vessels in the abdomen or pelvis. 2. Focal ventral hernia slightly superior to the umbilicus which contains fat and mild panniculitis. No bowel containing hernia evident. 3.  Leiomyomatous uterus. 4.  No bowel obstruction.  No abscess.  No ascites. 5.  No renal or ureteral calculus.  No hydronephrosis. Electronically Signed   By: Lowella Grip III M.D.   On: 07/02/2017 12:05    Scheduled Meds: . aspirin EC  81 mg Oral Daily  . atorvastatin  80 mg Oral q1800  . carvedilol  12.5 mg Oral BID WC  . cloNIDine  0.1 mg Oral Daily  . colchicine  0.6 mg Oral BID  . enoxaparin (LOVENOX) injection  40 mg Subcutaneous Q24H  . furosemide  20 mg Oral Daily  . pantoprazole  40 mg Oral Daily  . [START ON 07/06/2017] predniSONE  10 mg  Oral Q breakfast  . predniSONE  60 mg Oral Q breakfast  . sodium chloride flush  3 mL Intravenous Q12H  . zolpidem  5 mg Oral QHS      Time spent: 25 min  [facility] Pager [phone]. If 7PM-7AM, please contact night-coverage at [email or web address], Office  [phone]  password [email or web address]  07/03/2017, 1:06 PM  LOS: 0 days

## 2017-07-03 NOTE — Progress Notes (Signed)
  Echocardiogram 2D Echocardiogram has been performed.  Randa Lynn Chae [place] 07/03/2017, 12:58 PM

## 2017-07-03 NOTE — Progress Notes (Signed)
Progress Note  Patient Name: Kathy Bruce Date of Encounter: 07/03/2017  Primary Cardiologist: Dr. Debara Pickett   Subjective   Kathy Bruce is still having significant chest and back pain and notes no real improvement since she presented to the ED yesterday. She still ranks the pain as a 10/10 and states it is worse with deep inspiration, movements, and when lying supine. She still has significant chest wall tenderness. She reports orthopnea and PND overnight. She still feels likes her heart is racing but denies any lightheadedness or dizziness. No lower leg edema.  Inpatient Medications    Scheduled Meds: . aspirin EC  81 mg Oral Daily  . atorvastatin  80 mg Oral q1800  . carvedilol  12.5 mg Oral BID WC  . cloNIDine  0.1 mg Oral Daily  . enoxaparin (LOVENOX) injection  40 mg Subcutaneous Q24H  . furosemide  20 mg Oral Daily  . pantoprazole  40 mg Oral Daily  . predniSONE  10 mg Oral Q breakfast  . sodium chloride flush  3 mL Intravenous Q12H  . zolpidem  5 mg Oral QHS   Continuous Infusions: . sodium chloride     PRN Meds: sodium chloride, acetaminophen **OR** acetaminophen, hydrALAZINE, HYDROmorphone (DILAUDID) injection, sodium chloride flush, tetrahydrozoline   Vital Signs    Vitals:   07/02/17 2355 07/03/17 0500 07/03/17 0540 07/03/17 0645  BP: (!) 148/89  (!) 173/107 124/81  Pulse:   (!) 107   Resp:   18   Temp:   98.5 F (36.9 C)   TempSrc:   Oral   SpO2:   99%   Weight:  184 lb 15.5 oz (83.9 kg)    Height:       No intake or output data in the 24 hours ending 07/03/17 0815 Filed Weights   07/02/17 1006 07/02/17 2047 07/03/17 0500  Weight: 183 lb (83 kg) 185 lb 6.5 oz (84.1 kg) 184 lb 15.5 oz (83.9 kg)    Telemetry    SR, ST up to 110s - Personally Reviewed  ECG    01/24 Sinus tachycardia with ventricular rate of 101, LVH (?), no T wave inversions, ST depressions, or ST elevations  - Personally Reviewed  Physical Exam   General: Well developed,  well nourished, female sitting comfortably in bed in no acute distress, appears in moderate pain when moving. Head: Normocephalic, atraumatic.  Neck: Supple without bruits, JVD not elevated. Lungs:  Resp regular and unlabored, CTA; hard to take deep breath due to pain; exquisite chest wall tenderness Heart: tachycardic with regular rhythm, S1, S2, no S3, S4, 3/6 systolic murmur heard best at LUSB Abdomen: Soft, , non-distended with normoactive bowel sounds. Palpable hernia in epigastric area that is mildly tender to palpation Extremities: No clubbing, cyanosis, no edema. Distal pedal pulses are 2+ bilaterally. Neuro: Alert and oriented X 3. Moves all extremities spontaneously. Psych: Normal affect.  Labs    Hematology Recent Labs  Lab 07/02/17 1000 07/02/17 1007 07/03/17 0239  WBC 6.6  --  7.3  RBC 4.06  --  3.78*  HGB 12.3 12.6 11.3*  HCT 37.1 37.0 34.6*  MCV 91.4  --  91.5  MCH 30.3  --  29.9  MCHC 33.2  --  32.7  RDW 14.7  --  15.0  PLT 270  --  255    Chemistry Recent Labs  Lab 07/02/17 1007 07/03/17 0239  NA 141 138  K 3.8 3.4*  CL 107 103  CO2  --  24  GLUCOSE 102* 155*  BUN 16 12  CREATININE 1.20* 1.19*  CALCIUM  --  9.8  PROT  --  7.9  ALBUMIN  --  3.8  AST  --  31  ALT  --  22  ALKPHOS  --  90  BILITOT  --  0.6  GFRNONAA  --  52*  GFRAA  --  >60  ANIONGAP  --  11     Cardiac Enzymes Recent Labs  Lab 07/02/17 2047 07/03/17 0239  TROPONINI <0.03 0.03*    Recent Labs  Lab 07/02/17 1006  TROPIPOC 0.00    Lab Results  Component Value Date   ESRSEDRATE 62 (H) 07/02/2017   Radiology    Dg Chest Portable 1 View  Result Date: 07/02/2017 CLINICAL DATA:  Back pain for 2 days and chest pain starting yesterday. EXAM: PORTABLE CHEST 1 VIEW COMPARISON:  None. FINDINGS: Subcutaneous implantable cardioverter-defibrillator noted. Mild enlargement of the cardiopericardial silhouette. The lungs appear clear. No pleural effusion identified. IMPRESSION: 1.  Mild enlargement of the cardiopericardial silhouette, without edema. 2. Subcutaneous implantable cardioverter-defibrillator noted. Electronically Signed   By: Van Clines M.D.   On: 07/02/2017 10:58   Ct Angio Chest/abd/pel For Dissection W And/or Wo Contrast  Result Date: 07/02/2017 CLINICAL DATA:  Chest and abdominal pain EXAM: CT ANGIOGRAPHY CHEST, ABDOMEN AND PELVIS TECHNIQUE: Initially, axial CT images were obtained through the chest without intravenous contrast material administration. Multidetector CT imaging through the chest, abdomen and pelvis was performed using the standard protocol during bolus administration of intravenous contrast. Multiplanar reconstructed images and MIPs were obtained and reviewed to evaluate the vascular anatomy. CONTRAST:  13mL ISOVUE-370 IOPAMIDOL (ISOVUE-370) INJECTION 76% COMPARISON:  Chest radiograph July 02, 2017 FINDINGS: CTA CHEST FINDINGS Cardiovascular: There is no intramural hematoma in the thoracic aorta on the noncontrast enhanced study. There is no thoracic aortic aneurysm or dissection. The visualized great vessels appear unremarkable. Note that the right and left common carotid arteries arise as a common trunk, an anatomic variant. There is no appreciable pericardial thickening or pericardial effusion. There is no demonstrable pulmonary embolus. A defibrillator is present on the left with the defibrillator lead tip in the soft tissues anteriorly on the left. Mediastinum/Nodes: Visualized thyroid appears normal. No adenopathy is evident in the thoracic region by size criteria. There are scattered subcentimeter axillary lymph nodes, regarded as nonspecific. No esophageal lesions are evident. Lungs/Pleura: There is no lung edema or consolidation. No pleural effusion or pleural thickening evident. Musculoskeletal: There are no blastic or lytic bone lesions. Review of the MIP images confirms the above findings. CTA ABDOMEN AND PELVIS FINDINGS VASCULAR  Aorta: No appreciable thoracic aortic aneurysm or dissection. No appreciable aortic narrowing or calcification. No appreciable atherosclerotic plaque. Celiac: Celiac artery and its major branches appear patent. No aneurysm or dissection. SMA: Superior mesenteric artery and its branches appear patent. No aneurysm or dissection. Either renal artery or its branches. No fibromuscular dysplasia. No aneurysm or dissection on either side. Each IMA: Inferior mesenteric artery and its branches are patent. No aneurysm or dissection. Inflow: Major pelvic arterial vessels are patent as are proximal superficial femoral and profunda femoral arteries. No pelvic or upper thigh vascular atherosclerosis appreciable. No aneurysm or dissection in the major pelvic arterial vessels. Veins: No obvious venous abnormality within the limitations of this arterial phase study. Review of the MIP images confirms the above findings. NON-VASCULAR Hepatobiliary: No focal liver lesions are apparent. Gallbladder wall is not appreciably thickened. There is no biliary duct  dilatation. Pancreas: No pancreatic mass or inflammatory focus. Spleen: No splenic lesions are evident. Adrenals/Urinary Tract: Adrenals bilaterally appear unremarkable. Kidneys bilaterally show no evident mass or hydronephrosis on either side. There is no appreciable renal or ureteral calculus on either side. Urinary bladder is midline with wall thickness within normal limits. Stomach/Bowel: There is no appreciable bowel wall or mesenteric thickening. No evident bowel obstruction. No free air or portal venous air. Lymphatic: No adenopathy is apparent in the abdomen or pelvis. Reproductive: Uterus is anteverted. Uterus appears somewhat lobulated in contour, likely due to underlying leiomyomatous change. There is a mass arising from the rightward aspect of the uterus somewhat eccentrically felt to represent a leiomyoma measuring 5.5 x 4.4 x 3.6 cm. No extrauterine pelvic mass evident.  Other: Appendix appears unremarkable. No abscess or ascites is evident in the abdomen or pelvis. There is a focal ventral hernia slightly superior to the umbilicus containing fat and mild changes of panniculitis. This hernia has a neck measurement from right to left of 1.2 cm and from superior to inferior dimension of 0.8 cm. No bowel containing hernia evident. Musculoskeletal: There are no blastic or lytic bone lesions. There is no intramuscular or abdominal wall lesion. Review of the MIP images confirms the above findings. IMPRESSION: CT angiogram chest: 1. No thoracic aortic aneurysm or dissection. No evident pulmonary embolus. 2. Subcutaneous defibrillator present with lead tip anterior left chest wall. 3.  No lung edema or consolidation. 4.  No evident thoracic adenopathy. CT angiogram abdomen; CT angiogram pelvis: 1. No aneurysm or dissection in the aorta or major mesenteric/pelvic arterial vessels. No appreciable atherosclerotic change noted in the arterial vessels in the abdomen or pelvis. 2. Focal ventral hernia slightly superior to the umbilicus which contains fat and mild panniculitis. No bowel containing hernia evident. 3.  Leiomyomatous uterus. 4.  No bowel obstruction.  No abscess.  No ascites. 5.  No renal or ureteral calculus.  No hydronephrosis. Electronically Signed   By: Lowella Grip III M.D.   On: 07/02/2017 12:05     Cardiac Studies   ECHO: Ordered  Patient Profile     Ms. Duggar is a 52 y.o. African-American female with a history of HTN, CAD, prior stent and is s/p AICD for ischemic cardiomyopathy who presented to the ED yesterday for evaluation of chest pain.  Assessment & Plan    Principal Problem:   Chest pain with moderate risk for cardiac etiology Active Problems:   Hypertensive urgency   ICD (implantable cardioverter-defibrillator) in place, [facility], 2013   UTI (urinary tract infection)   Chest pain  1. Chest Pain - Patient still reports significant chest pain  and notes no improvement since first presenting to the ED. - Initial I-stat troponin 0.00ng/mL. Repeat troponin at 0235 was 0.03ng/mL.  - Sed rate elevated at 24mm/hr, chest wall very tender - inflammatory cause most likely - Echo was ordered yesterday but doubt pericarditis as ECG not c/w this - Will have AICD interrogated  2. HTN - BP is still uncontrolled on the Clonidine 0.1 mg qd Lasix 20 mg qd, and Coreg 12.5 mg bid  - Most recent BP 124/81 - However, prefer hydralazine over clonidine in pt w/ ICM, will d/c clonidine and start hydralazine 10 mg tid, Imdur 30 mg qd, continue BB - decide on ACE/ARB/Entresto based on EF   3. CHF - Waiting on Echo - Patient will likely need aggressive therapy if EF is low with ACE/ARB/Entresto, spironolactone, and diuretic - Continue strict I&Os  and low Na diet  4. CAD - Patient reports a previous MI in 2013 with sent placement - is on ASA, BB, starting statin  5. Hyperlipidemia, goal LDL < 70 - Lipid panel: total cholesterol 256, triglycerides 187, HDL 59, LDL 160 - Start high intensity statin (Atorvastatin 80mg ) - Continue Aspirin 81mg  daily  Signed, Darreld Mclean , student PA [place], [place] 8:15 AM 07/03/2017 Pager: [phone]

## 2017-07-04 DIAGNOSIS — R011 Cardiac murmur, unspecified: Secondary | ICD-10-CM | POA: Diagnosis present

## 2017-07-04 DIAGNOSIS — I340 Nonrheumatic mitral (valve) insufficiency: Secondary | ICD-10-CM | POA: Diagnosis present

## 2017-07-04 DIAGNOSIS — M546 Pain in thoracic spine: Secondary | ICD-10-CM | POA: Diagnosis present

## 2017-07-04 DIAGNOSIS — R079 Chest pain, unspecified: Secondary | ICD-10-CM | POA: Diagnosis not present

## 2017-07-04 DIAGNOSIS — Z791 Long term (current) use of non-steroidal anti-inflammatories (NSAID): Secondary | ICD-10-CM | POA: Diagnosis not present

## 2017-07-04 DIAGNOSIS — I5030 Unspecified diastolic (congestive) heart failure: Secondary | ICD-10-CM | POA: Diagnosis present

## 2017-07-04 DIAGNOSIS — R0789 Other chest pain: Secondary | ICD-10-CM | POA: Diagnosis not present

## 2017-07-04 DIAGNOSIS — N390 Urinary tract infection, site not specified: Secondary | ICD-10-CM | POA: Diagnosis present

## 2017-07-04 DIAGNOSIS — D259 Leiomyoma of uterus, unspecified: Secondary | ICD-10-CM | POA: Diagnosis present

## 2017-07-04 DIAGNOSIS — Z955 Presence of coronary angioplasty implant and graft: Secondary | ICD-10-CM | POA: Diagnosis not present

## 2017-07-04 DIAGNOSIS — I319 Disease of pericardium, unspecified: Secondary | ICD-10-CM | POA: Diagnosis present

## 2017-07-04 DIAGNOSIS — K439 Ventral hernia without obstruction or gangrene: Secondary | ICD-10-CM | POA: Diagnosis present

## 2017-07-04 DIAGNOSIS — I255 Ischemic cardiomyopathy: Secondary | ICD-10-CM | POA: Diagnosis present

## 2017-07-04 DIAGNOSIS — B349 Viral infection, unspecified: Secondary | ICD-10-CM | POA: Diagnosis present

## 2017-07-04 DIAGNOSIS — I160 Hypertensive urgency: Secondary | ICD-10-CM | POA: Diagnosis present

## 2017-07-04 DIAGNOSIS — I420 Dilated cardiomyopathy: Secondary | ICD-10-CM | POA: Diagnosis not present

## 2017-07-04 DIAGNOSIS — R200 Anesthesia of skin: Secondary | ICD-10-CM | POA: Diagnosis present

## 2017-07-04 DIAGNOSIS — I110 Hypertensive heart disease with heart failure: Secondary | ICD-10-CM | POA: Diagnosis present

## 2017-07-04 DIAGNOSIS — I2510 Atherosclerotic heart disease of native coronary artery without angina pectoris: Secondary | ICD-10-CM | POA: Diagnosis present

## 2017-07-04 DIAGNOSIS — M109 Gout, unspecified: Secondary | ICD-10-CM | POA: Diagnosis present

## 2017-07-04 DIAGNOSIS — Z79899 Other long term (current) drug therapy: Secondary | ICD-10-CM | POA: Diagnosis not present

## 2017-07-04 DIAGNOSIS — E785 Hyperlipidemia, unspecified: Secondary | ICD-10-CM | POA: Diagnosis present

## 2017-07-04 DIAGNOSIS — R000 Tachycardia, unspecified: Secondary | ICD-10-CM | POA: Diagnosis present

## 2017-07-04 DIAGNOSIS — I252 Old myocardial infarction: Secondary | ICD-10-CM | POA: Diagnosis not present

## 2017-07-04 DIAGNOSIS — Z95810 Presence of automatic (implantable) cardiac defibrillator: Secondary | ICD-10-CM | POA: Diagnosis not present

## 2017-07-04 DIAGNOSIS — J988 Other specified respiratory disorders: Secondary | ICD-10-CM | POA: Diagnosis present

## 2017-07-04 LAB — BASIC METABOLIC PANEL
Anion gap: 11 (ref 5–15)
BUN: 18 mg/dL (ref 6–20)
CO2: 22 mmol/L (ref 22–32)
CREATININE: 1.32 mg/dL — AB (ref 0.44–1.00)
Calcium: 9.7 mg/dL (ref 8.9–10.3)
Chloride: 103 mmol/L (ref 101–111)
GFR calc Af Amer: 53 mL/min — ABNORMAL LOW (ref >60)
GFR, EST NON AFRICAN AMERICAN: 46 mL/min — AB (ref >60)
GLUCOSE: 188 mg/dL — AB (ref 65–99)
Potassium: 4.3 mmol/L (ref 3.5–5.1)
Sodium: 136 mmol/L (ref 135–145)

## 2017-07-04 MED ORDER — LOSARTAN POTASSIUM 25 MG PO TABS
25.0000 mg | ORAL_TABLET | Freq: Every day | ORAL | Status: DC
  Administered 2017-07-04 – 2017-07-05 (×2): 25 mg via ORAL
  Filled 2017-07-04 (×2): qty 1

## 2017-07-04 MED ORDER — SPIRONOLACTONE 25 MG PO TABS
25.0000 mg | ORAL_TABLET | Freq: Every day | ORAL | Status: DC
  Administered 2017-07-04 – 2017-07-05 (×2): 25 mg via ORAL
  Filled 2017-07-04 (×2): qty 1

## 2017-07-04 MED ORDER — IBUPROFEN 200 MG PO TABS
600.0000 mg | ORAL_TABLET | Freq: Four times a day (QID) | ORAL | Status: DC
  Administered 2017-07-04 – 2017-07-05 (×4): 600 mg via ORAL
  Filled 2017-07-04 (×4): qty 3

## 2017-07-04 NOTE — Progress Notes (Addendum)
Progress Note  Patient Name: Kathy Bruce Date of Encounter: 07/04/2017  Primary Cardiologist: No primary care provider on file.   Subjective   Still complaints of chest wall pain.  No SOB  Inpatient Medications    Scheduled Meds: . aspirin EC  81 mg Oral Daily  . carvedilol  12.5 mg Oral BID WC  . cloNIDine  0.1 mg Oral Daily  . colchicine  0.6 mg Oral BID  . enoxaparin (LOVENOX) injection  40 mg Subcutaneous Q24H  . furosemide  20 mg Oral Daily  . pantoprazole  40 mg Oral Daily  . [START ON 07/06/2017] predniSONE  10 mg Oral Q breakfast  . predniSONE  60 mg Oral Q breakfast  . sodium chloride flush  3 mL Intravenous Q12H  . zolpidem  5 mg Oral QHS   Continuous Infusions: . sodium chloride     PRN Meds: sodium chloride, acetaminophen **OR** acetaminophen, hydrALAZINE, HYDROmorphone (DILAUDID) injection, sodium chloride flush, tetrahydrozoline   Vital Signs    Vitals:   07/03/17 0645 07/03/17 1400 07/03/17 2134 07/04/17 0519  BP: 124/81 (!) 141/83 (!) 145/93 (!) 149/89  Pulse:  (!) 106 94 94  Resp:  20 16 18   Temp:  99 F (37.2 C) 98.2 F (36.8 C) 98 F (36.7 C)  TempSrc:  Oral Oral Oral  SpO2:  100% 94% 99%  Weight:    184 lb 4.9 oz (83.6 kg)  Height:       No intake or output data in the 24 hours ending 07/04/17 0824 Filed Weights   07/02/17 2047 07/03/17 0500 07/04/17 0519  Weight: 185 lb 6.5 oz (84.1 kg) 184 lb 15.5 oz (83.9 kg) 184 lb 4.9 oz (83.6 kg)    Telemetry    NSR - Personally Reviewed  ECG    No new EKG to review - Personally Reviewed  Physical Exam   GEN: No acute distress.   Neck: No JVD Cardiac: RRR, no murmurs, rubs, or gallops.  Respiratory: Clear to auscultation bilaterally. GI: Soft, nontender, non-distended  MS: No edema; No deformity. Neuro:  Nonfocal  Psych: Normal affect   Labs    Chemistry Recent Labs  Lab 07/02/17 1007 07/03/17 0239 07/04/17 0439  NA 141 138 136  K 3.8 3.4* 4.3  CL 107 103 103  CO2  --   24 22  GLUCOSE 102* 155* 188*  BUN 16 12 18   CREATININE 1.20* 1.19* 1.32*  CALCIUM  --  9.8 9.7  PROT  --  7.9  --   ALBUMIN  --  3.8  --   AST  --  31  --   ALT  --  22  --   ALKPHOS  --  90  --   BILITOT  --  0.6  --   GFRNONAA  --  52* 46*  GFRAA  --  >60 53*  ANIONGAP  --  11 11     Hematology Recent Labs  Lab 07/02/17 1000 07/02/17 1007 07/03/17 0239  WBC 6.6  --  7.3  RBC 4.06  --  3.78*  HGB 12.3 12.6 11.3*  HCT 37.1 37.0 34.6*  MCV 91.4  --  91.5  MCH 30.3  --  29.9  MCHC 33.2  --  32.7  RDW 14.7  --  15.0  PLT 270  --  255    Cardiac Enzymes Recent Labs  Lab 07/02/17 2047 07/03/17 0239 07/03/17 0931  TROPONINI <0.03 0.03* <0.03    Recent Labs  Lab 07/02/17 1006  TROPIPOC 0.00     BNPNo results for input(s): BNP, PROBNP in the last 168 hours.   DDimer No results for input(s): DDIMER in the last 168 hours.   Radiology    Dg Chest Portable 1 View  Result Date: 07/02/2017 CLINICAL DATA:  Back pain for 2 days and chest pain starting yesterday. EXAM: PORTABLE CHEST 1 VIEW COMPARISON:  None. FINDINGS: Subcutaneous implantable cardioverter-defibrillator noted. Mild enlargement of the cardiopericardial silhouette. The lungs appear clear. No pleural effusion identified. IMPRESSION: 1. Mild enlargement of the cardiopericardial silhouette, without edema. 2. Subcutaneous implantable cardioverter-defibrillator noted. Electronically Signed   By: Van Clines M.D.   On: 07/02/2017 10:58   Ct Angio Chest/abd/pel For Dissection W And/or Wo Contrast  Result Date: 07/02/2017 CLINICAL DATA:  Chest and abdominal pain EXAM: CT ANGIOGRAPHY CHEST, ABDOMEN AND PELVIS TECHNIQUE: Initially, axial CT images were obtained through the chest without intravenous contrast material administration. Multidetector CT imaging through the chest, abdomen and pelvis was performed using the standard protocol during bolus administration of intravenous contrast. Multiplanar  reconstructed images and MIPs were obtained and reviewed to evaluate the vascular anatomy. CONTRAST:  12m ISOVUE-370 IOPAMIDOL (ISOVUE-370) INJECTION 76% COMPARISON:  Chest radiograph July 02, 2017 FINDINGS: CTA CHEST FINDINGS Cardiovascular: There is no intramural hematoma in the thoracic aorta on the noncontrast enhanced study. There is no thoracic aortic aneurysm or dissection. The visualized great vessels appear unremarkable. Note that the right and left common carotid arteries arise as a common trunk, an anatomic variant. There is no appreciable pericardial thickening or pericardial effusion. There is no demonstrable pulmonary embolus. A defibrillator is present on the left with the defibrillator lead tip in the soft tissues anteriorly on the left. Mediastinum/Nodes: Visualized thyroid appears normal. No adenopathy is evident in the thoracic region by size criteria. There are scattered subcentimeter axillary lymph nodes, regarded as nonspecific. No esophageal lesions are evident. Lungs/Pleura: There is no lung edema or consolidation. No pleural effusion or pleural thickening evident. Musculoskeletal: There are no blastic or lytic bone lesions. Review of the MIP images confirms the above findings. CTA ABDOMEN AND PELVIS FINDINGS VASCULAR Aorta: No appreciable thoracic aortic aneurysm or dissection. No appreciable aortic narrowing or calcification. No appreciable atherosclerotic plaque. Celiac: Celiac artery and its major branches appear patent. No aneurysm or dissection. SMA: Superior mesenteric artery and its branches appear patent. No aneurysm or dissection. Either renal artery or its branches. No fibromuscular dysplasia. No aneurysm or dissection on either side. Each IMA: Inferior mesenteric artery and its branches are patent. No aneurysm or dissection. Inflow: Major pelvic arterial vessels are patent as are proximal superficial femoral and profunda femoral arteries. No pelvic or upper thigh vascular  atherosclerosis appreciable. No aneurysm or dissection in the major pelvic arterial vessels. Veins: No obvious venous abnormality within the limitations of this arterial phase study. Review of the MIP images confirms the above findings. NON-VASCULAR Hepatobiliary: No focal liver lesions are apparent. Gallbladder wall is not appreciably thickened. There is no biliary duct dilatation. Pancreas: No pancreatic mass or inflammatory focus. Spleen: No splenic lesions are evident. Adrenals/Urinary Tract: Adrenals bilaterally appear unremarkable. Kidneys bilaterally show no evident mass or hydronephrosis on either side. There is no appreciable renal or ureteral calculus on either side. Urinary bladder is midline with wall thickness within normal limits. Stomach/Bowel: There is no appreciable bowel wall or mesenteric thickening. No evident bowel obstruction. No free air or portal venous air. Lymphatic: No adenopathy is apparent in the abdomen or  pelvis. Reproductive: Uterus is anteverted. Uterus appears somewhat lobulated in contour, likely due to underlying leiomyomatous change. There is a mass arising from the rightward aspect of the uterus somewhat eccentrically felt to represent a leiomyoma measuring 5.5 x 4.4 x 3.6 cm. No extrauterine pelvic mass evident. Other: Appendix appears unremarkable. No abscess or ascites is evident in the abdomen or pelvis. There is a focal ventral hernia slightly superior to the umbilicus containing fat and mild changes of panniculitis. This hernia has a neck measurement from right to left of 1.2 cm and from superior to inferior dimension of 0.8 cm. No bowel containing hernia evident. Musculoskeletal: There are no blastic or lytic bone lesions. There is no intramuscular or abdominal wall lesion. Review of the MIP images confirms the above findings. IMPRESSION: CT angiogram chest: 1. No thoracic aortic aneurysm or dissection. No evident pulmonary embolus. 2. Subcutaneous defibrillator present  with lead tip anterior left chest wall. 3.  No lung edema or consolidation. 4.  No evident thoracic adenopathy. CT angiogram abdomen; CT angiogram pelvis: 1. No aneurysm or dissection in the aorta or major mesenteric/pelvic arterial vessels. No appreciable atherosclerotic change noted in the arterial vessels in the abdomen or pelvis. 2. Focal ventral hernia slightly superior to the umbilicus which contains fat and mild panniculitis. No bowel containing hernia evident. 3.  Leiomyomatous uterus. 4.  No bowel obstruction.  No abscess.  No ascites. 5.  No renal or ureteral calculus.  No hydronephrosis. Electronically Signed   By: Lowella Grip III M.D.   On: 07/02/2017 12:05    Cardiac Studies   Study Conclusions  - Left ventricle: The cavity size was mildly dilated. Wall   thickness was normal. Systolic function was moderately to   severely reduced. The estimated ejection fraction was in the   range of 30% to 35%. Moderate diffuse hypokinesis with no   identifiable regional variations. Features are consistent with a   pseudonormal left ventricular filling pattern, with concomitant   abnormal relaxation and increased filling pressure (grade 2   diastolic dysfunction). - Mitral valve: There was moderate to severe regurgitation directed   centrally. The acceleration rate of the regurgitant jet was   reduced, consistent with a low dP/dt. - Left atrium: The atrium was mildly dilated.  Patient Profile     52 y.o. female 52 y.o. female who is being seen today for the evaluation of chest pain at the request of Dr. Alvino Chapel. Ms. Jordahl recently moved to [place] from [place], [place] - she has a histrory of CAD, prior stent and is s/p AICD for ischemic cardiomyopathy with LVEF of ~25% per her report. Interestingly, this appear to be a subcutaneous ICD device. She is reporting several days of back pain and more recently chest pain - there is significant chest wall tenderness over the area of the lead.  She also reports some orthopnea and PND. BP on arrival was markedly elevated and she says she has been out of her medication for >1 month.   Assessment & Plan    1. Chest Pain - EKG showed no acute ST changes. Trop neg x 3 - Suspect chest pain is secondary to longstanding HTN, CHF - pleuritic chest pain, ?possible pericarditis given history (worse with movement, deep inspiration, and lying supine; + chest wall tenderness; recent viral illness). - ESR elevated but no rub on exam and ECG not c/w this. - CTA showed no aortic dissection and no obvious PE - started on Colchicine.  Still with some  chest wall pain. - recommend stopping prednisone which can result in chronically recurring pericarditis.  Would treat with NSAIDs instead.  2. HTN - Longstanding history of uncontrolled HTN. Patient states her BP is normally 180s/100s with medicine (Clonidine and Carvedilol). - BP in ED was 230/130.  - BP much improved and today 149/21mHg.   - adding losartan for CHF which will help with BP.  3. CHF - Patient reports she was diagnosed with CHF following an MI in 2013 and had to have AICD placed. [place] need to get outside records if possible. - echo showed moderate to severely reduced LVF with EF 30-35% with moderate to severe MR. - continue carvedilol 12.533mBID.   - Add Losartan 2536maily.  [facility] tolerates [facility]   - Add spironolactone 45m86mily.   - continue PO Lasix - dose not appear volume overloaded. - follow renal function closely with initiation of spiro and ARB.  4. CAD - Patient reports she had a MI in 2013 and had a stent placed. Will work on getting outside records.  - Continue ASA, BB and statin.  5. Compliance - encourage compliance w/ rx, try to limit costs  6.  Moderate to severe MR - secondary due to annular dilatation and LV dysfunction.  Adding ARB.    For questions or updates, please  contact [facility] consult [email or web address] for contact info under Cardiology/STEMI.      Signed, TracFransico Him  07/04/2017, 8:24 AM

## 2017-07-04 NOTE — Progress Notes (Signed)
[facility]  PROGRESS NOTE  Kathy Bruce MRN:[id number] DOB: 03/02/1966 DOA: 07/02/2017 PCP: Patient, No Pcp Per   Brief HPI:   51 y.o. female, CAD, CHF (EF 25 approx), s/p ICD, 2013, hypertension, apparently c/o chest pain since last nite. Tightness,  Substernal, took 2 slg nitro without relief.  Apparently she has had upper back pain for the past 3 days.  Pt had some kind of viral illness over the weekend.      Subjective   Patient seen and examined,  Still complains of chest wall pain.   Assessment/Plan:     1. Chest pain- likely musculoskeletal vs pericarditis. Patient started on colchicine and prednisone. No further cardiac testing recommended by cardiology at this time. ESR elevated at 62 MM per hour. The patient was initially started on prednisone high-dose. Today cardiology recommend start only NSAIDs. Will discontinue prednisone 60 mg and start ibuprofen 600 mg Q6 hours. 2. Hypertension-continue clonidine, hydralazine 3. CAD-patient has history of CAD with stent placement in 2013. Continue aspirin, Coreg, atorvastatin. 4. CHF- echocardiogram has been obtained. Will follow the results. Continue furosemide 20 mg PO daily. 5. History of gout-continue prednisone 10 mg daily.    DVT prophylaxis: Lovenox  Code Status: Full code  Family Communication: Discussed with family member at bedside  Disposition Plan: Home in am   Consultants:  Cardiology   Procedures:  None   Continuous infusions . sodium chloride        Antibiotics:   Anti-infectives (From admission, onward)   None       Objective   Vitals:   07/03/17 0645 07/03/17 1400 07/03/17 2134 07/04/17 0519  BP: 124/81 (!) 141/83 (!) 145/93 (!) 149/89  Pulse:  (!) 106 94 94  Resp:  20 16 18  Temp:  99 F (37.2 C) 98.2 F (36.8 C) 98 F (36.7 C)  TempSrc:  Oral Oral Oral  SpO2:  100% 94% 99%  Weight:    83.6 kg (184 lb 4.9 oz)  Height:       No intake or output data in the 24 hours  ending 07/04/17 1345 Filed Weights   07/02/17 2047 07/03/17 0500 07/04/17 0519  Weight: 84.1 kg (185 lb 6.5 oz) 83.9 kg (184 lb 15.5 oz) 83.6 kg (184 lb 4.9 oz)     Physical Examination:   Physical Exam: Eyes: No icterus, extraocular muscles intact  Mouth: Oral mucosa is moist, no lesions on palate,  Neck: Supple, no deformities, masses, or tenderness Lungs: Normal respiratory effort, bilateral clear to auscultation, no crackles or wheezes.  Heart: Regular rate and rhythm, S1 and S2 normal, no murmurs, rubs auscultated. Positive chest wall tenderness to palpation. Abdomen: BS normoactive,soft,nondistended,non-tender to palpation,no organomegaly Extremities: No pretibial edema, no erythema, no cyanosis, no clubbing Neuro : Alert and oriented to time, place and person, No focal deficits Skin: No rashes seen on exam    Data Reviewed: I have personally reviewed following labs and imaging studies  CBG: No results for input(s): GLUCAP in the last 168 hours.  CBC: Recent Labs  Lab 07/02/17 1000 07/02/17 1007 07/03/17 0239  WBC 6.6  --  7.3  NEUTROABS 3.0  --   --   HGB 12.3 12.6 11.3*  HCT 37.1 37.0 34.6*  MCV 91.4  --  91.5  PLT 270  --  255    Basic Metabolic Panel: Recent Labs  Lab 07/02/17 1007 07/03/17 0239 07/04/17 0439  NA 141 138 136  K 3.8 3.4* 4.3  CL 107   103 103  CO2  --  24 22  GLUCOSE 102* 155* 188*  BUN 16 12 18  CREATININE 1.20* 1.19* 1.32*  CALCIUM  --  9.8 9.7    No results found for this or any previous visit (from the past 240 hour(s)).   Liver Function Tests: Recent Labs  Lab 07/03/17 0239  AST 31  ALT 22  ALKPHOS 90  BILITOT 0.6  PROT 7.9  ALBUMIN 3.8   No results for input(s): LIPASE, AMYLASE in the last 168 hours. No results for input(s): AMMONIA in the last 168 hours.  Cardiac Enzymes: Recent Labs  Lab 07/02/17 2047 07/03/17 0239 07/03/17 0931  TROPONINI <0.03 0.03* <0.03   BNP (last 3 results) No results for  input(s): BNP in the last 8760 hours.  ProBNP (last 3 results) No results for input(s): PROBNP in the last 8760 hours.    Studies: No results found.  Scheduled Meds: . aspirin EC  81 mg Oral Daily  . carvedilol  12.5 mg Oral BID WC  . cloNIDine  0.1 mg Oral Daily  . colchicine  0.6 mg Oral BID  . enoxaparin (LOVENOX) injection  40 mg Subcutaneous Q24H  . furosemide  20 mg Oral Daily  . ibuprofen  600 mg Oral QID  . losartan  25 mg Oral Daily  . pantoprazole  40 mg Oral Daily  . [START ON 07/06/2017] predniSONE  10 mg Oral Q breakfast  . sodium chloride flush  3 mL Intravenous Q12H  . spironolactone  25 mg Oral Daily  . zolpidem  5 mg Oral QHS      Time spent: 25 min  Gagan S Lama   [facility] Pager [phone]. If 7PM-7AM, please contact night-coverage at [email or web address], Office  [phone]  password [email or web address]  07/04/2017, 1:45 PM  LOS: 0 days             

## 2017-07-05 MED ORDER — COLCHICINE 0.6 MG PO TABS: 0.6000 mg | ORAL_TABLET | Freq: Two times a day (BID) | ORAL | 0 refills | Status: AC

## 2017-07-05 MED ORDER — LOSARTAN POTASSIUM 25 MG PO TABS: 25.0000 mg | ORAL_TABLET | Freq: Every day | ORAL | 2 refills | Status: DC

## 2017-07-05 MED ORDER — OMEPRAZOLE 40 MG PO CPDR: 40.0000 mg | DELAYED_RELEASE_CAPSULE | Freq: Every day | ORAL | 2 refills | Status: AC

## 2017-07-05 MED ORDER — FUROSEMIDE 20 MG PO TABS: 20.0000 mg | ORAL_TABLET | Freq: Every day | ORAL | 2 refills | Status: DC

## 2017-07-05 MED ORDER — ASPIRIN EC 81 MG PO TBEC: 81.0000 mg | DELAYED_RELEASE_TABLET | Freq: Every day | ORAL | 2 refills | Status: AC

## 2017-07-05 MED ORDER — TETRAHYDROZOLINE HCL 0.05 % OP SOLN: 2.0000 [drp] | Freq: Every day | OPHTHALMIC | 0 refills | Status: AC | PRN

## 2017-07-05 MED ORDER — ZOLPIDEM TARTRATE 5 MG PO TABS: 5.0000 mg | ORAL_TABLET | Freq: Every day | ORAL | 0 refills | Status: DC

## 2017-07-05 MED ORDER — OXYCODONE-ACETAMINOPHEN 10-325 MG PO TABS: 1.0000 | ORAL_TABLET | Freq: Four times a day (QID) | ORAL | 0 refills | Status: DC | PRN

## 2017-07-05 MED ORDER — CLONIDINE HCL 0.1 MG PO TABS: 0.1000 mg | ORAL_TABLET | Freq: Every day | ORAL | 11 refills | Status: AC

## 2017-07-05 MED ORDER — CARVEDILOL 12.5 MG PO TABS: 12.5000 mg | ORAL_TABLET | Freq: Two times a day (BID) | ORAL | 2 refills | Status: DC

## 2017-07-05 MED ORDER — PREDNISONE 10 MG PO TABS: 10.0000 mg | ORAL_TABLET | Freq: Every day | ORAL | 1 refills | Status: AC

## 2017-07-05 MED ORDER — OXYCODONE HCL 5 MG PO TABS
5.0000 mg | ORAL_TABLET | Freq: Four times a day (QID) | ORAL | Status: DC | PRN
Start: 2017-07-05 — End: 2017-07-05

## 2017-07-05 NOTE — Discharge Summary (Signed)
Physician Discharge Summary  Kathy Bruce QMV:[id number] DOB: 22-Apr-1966 DOA: 07/02/2017  PCP: Patient, No Pcp Per  Admit date: 07/02/2017 Discharge date: 07/05/2017  Time spent: 35* minutes  Recommendations for Outpatient Follow-up:  1. Follow up cardiology in 2 weeks   Discharge Diagnoses:  Principal Problem:   Chest pain with moderate risk for cardiac etiology Active Problems:   Hypertensive urgency   ICD (implantable cardioverter-defibrillator) in place, BSCi, 2013   UTI (urinary tract infection)   Chest pain   Discharge Condition: Stable  Diet recommendation: Heart healthy diet  Filed Weights   07/03/17 0500 07/04/17 0519 07/05/17 0721  Weight: 83.9 kg (184 lb 15.5 oz) 83.6 kg (184 lb 4.9 oz) 82.5 kg (181 lb 14.1 oz)    History of present illness:  51 y.o.female,CAD, CHF (EF 25 approx), s/p ICD, 2013, hypertension, apparently c/o chest pain since last nite. Tightness, Substernal, took 2 slg nitro without relief. Apparently she has had upper back pain for the past 3 days. Pt had some kind of viral illness over the weekend.     Hospital Course:   1. Chest pain- improved,  likely musculoskeletal vs pericarditis. Patient started on colchicine and prednisone. No further cardiac testing recommended by cardiology at this time. ESR elevated at 62 MM per hour. The patient was initially started on prednisone high-dose, which has been discontinued.. Will discontinue prednisone 60 mg. Will discharge home on Percocet 1 tab po Q 6 hr prn. 2. Hypertension-continue clonidine, hydralazine. Losartan added by cardiology.  3. CAD-patient has history of CAD with stent placement in 2013. Continue aspirin, Coreg, atorvastatin. 4. CHF- echocardiogram has been obtained, which showed EF 28-41%, grade 2 diastolic dysfunction.  Continue furosemide 20 mg PO daily. 5. History of gout-continue prednisone 10 mg daily.    Procedures:  Echo   Consultations:  Cardiology  Discharge  Exam: Vitals:   07/05/17 0216 07/05/17 0510  BP: (!) 141/75 129/88  Pulse: 78 80  Resp:  18  Temp:  98 F (36.7 C)  SpO2:  97%    General: Appears in no acute distress Cardiovascular: S1S2 RRR Respiratory: Clear bilaterally  Discharge Instructions   Discharge Instructions    Diet - low sodium heart healthy   Complete by:  As directed    Increase activity slowly   Complete by:  As directed      Allergies as of 07/05/2017   Not on File     Medication List    TAKE these medications   aspirin EC 81 MG tablet Take 1 tablet (81 mg total) by mouth daily.   carvedilol 12.5 MG tablet Commonly known as:  COREG Take 1 tablet (12.5 mg total) by mouth 2 (two) times daily with a meal.   cloNIDine 0.1 MG tablet Commonly known as:  CATAPRES Take 1 tablet (0.1 mg total) by mouth daily. What changed:  medication strength   colchicine 0.6 MG tablet Take 1 tablet (0.6 mg total) by mouth 2 (two) times daily. What changed:    when to take this  reasons to take this   furosemide 20 MG tablet Commonly known as:  LASIX Take 1 tablet (20 mg total) by mouth daily. What changed:  when to take this   losartan 25 MG tablet Commonly known as:  COZAAR Take 1 tablet (25 mg total) by mouth daily.   naproxen 500 MG tablet Commonly known as:  NAPROSYN Take 500 mg by mouth 2 (two) times daily as needed for mild pain.   omeprazole  40 MG capsule Commonly known as:  PRILOSEC Take 1 capsule (40 mg total) by mouth daily.   oxyCODONE-acetaminophen 10-325 MG tablet Commonly known as:  PERCOCET Take 1 tablet by mouth every 6 (six) hours as needed for pain. What changed:  when to take this   predniSONE 10 MG tablet Commonly known as:  DELTASONE Take 1 tablet (10 mg total) by mouth daily with breakfast.   tetrahydrozoline 0.05 % ophthalmic solution Commonly known as:  VISINE Place 2 drops into both eyes daily as needed (DRY EYES). What changed:  medication strength   zolpidem 5 MG  tablet Commonly known as:  AMBIEN Take 1 tablet (5 mg total) by mouth at bedtime. What changed:    medication strength  how much to take      Not on File    The results of significant diagnostics from this hospitalization (including imaging, microbiology, ancillary and laboratory) are listed below for reference.    Significant Diagnostic Studies: Dg Chest Portable 1 View  Result Date: 07/02/2017 CLINICAL DATA:  Back pain for 2 days and chest pain starting yesterday. EXAM: PORTABLE CHEST 1 VIEW COMPARISON:  None. FINDINGS: Subcutaneous implantable cardioverter-defibrillator noted. Mild enlargement of the cardiopericardial silhouette. The lungs appear clear. No pleural effusion identified. IMPRESSION: 1. Mild enlargement of the cardiopericardial silhouette, without edema. 2. Subcutaneous implantable cardioverter-defibrillator noted. Electronically Signed   By: Van Clines M.D.   On: 07/02/2017 10:58   Ct Angio Chest/abd/pel For Dissection W And/or Wo Contrast  Result Date: 07/02/2017 CLINICAL DATA:  Chest and abdominal pain EXAM: CT ANGIOGRAPHY CHEST, ABDOMEN AND PELVIS TECHNIQUE: Initially, axial CT images were obtained through the chest without intravenous contrast material administration. Multidetector CT imaging through the chest, abdomen and pelvis was performed using the standard protocol during bolus administration of intravenous contrast. Multiplanar reconstructed images and MIPs were obtained and reviewed to evaluate the vascular anatomy. CONTRAST:  139m ISOVUE-370 IOPAMIDOL (ISOVUE-370) INJECTION 76% COMPARISON:  Chest radiograph July 02, 2017 FINDINGS: CTA CHEST FINDINGS Cardiovascular: There is no intramural hematoma in the thoracic aorta on the noncontrast enhanced study. There is no thoracic aortic aneurysm or dissection. The visualized great vessels appear unremarkable. Note that the right and left common carotid arteries arise as a common trunk, an anatomic variant.  There is no appreciable pericardial thickening or pericardial effusion. There is no demonstrable pulmonary embolus. A defibrillator is present on the left with the defibrillator lead tip in the soft tissues anteriorly on the left. Mediastinum/Nodes: Visualized thyroid appears normal. No adenopathy is evident in the thoracic region by size criteria. There are scattered subcentimeter axillary lymph nodes, regarded as nonspecific. No esophageal lesions are evident. Lungs/Pleura: There is no lung edema or consolidation. No pleural effusion or pleural thickening evident. Musculoskeletal: There are no blastic or lytic bone lesions. Review of the MIP images confirms the above findings. CTA ABDOMEN AND PELVIS FINDINGS VASCULAR Aorta: No appreciable thoracic aortic aneurysm or dissection. No appreciable aortic narrowing or calcification. No appreciable atherosclerotic plaque. Celiac: Celiac artery and its major branches appear patent. No aneurysm or dissection. SMA: Superior mesenteric artery and its branches appear patent. No aneurysm or dissection. Either renal artery or its branches. No fibromuscular dysplasia. No aneurysm or dissection on either side. Each IMA: Inferior mesenteric artery and its branches are patent. No aneurysm or dissection. Inflow: Major pelvic arterial vessels are patent as are proximal superficial femoral and profunda femoral arteries. No pelvic or upper thigh vascular atherosclerosis appreciable. No aneurysm or dissection in the  major pelvic arterial vessels. Veins: No obvious venous abnormality within the limitations of this arterial phase study. Review of the MIP images confirms the above findings. NON-VASCULAR Hepatobiliary: No focal liver lesions are apparent. Gallbladder wall is not appreciably thickened. There is no biliary duct dilatation. Pancreas: No pancreatic mass or inflammatory focus. Spleen: No splenic lesions are evident. Adrenals/Urinary Tract: Adrenals bilaterally appear  unremarkable. Kidneys bilaterally show no evident mass or hydronephrosis on either side. There is no appreciable renal or ureteral calculus on either side. Urinary bladder is midline with wall thickness within normal limits. Stomach/Bowel: There is no appreciable bowel wall or mesenteric thickening. No evident bowel obstruction. No free air or portal venous air. Lymphatic: No adenopathy is apparent in the abdomen or pelvis. Reproductive: Uterus is anteverted. Uterus appears somewhat lobulated in contour, likely due to underlying leiomyomatous change. There is a mass arising from the rightward aspect of the uterus somewhat eccentrically felt to represent a leiomyoma measuring 5.5 x 4.4 x 3.6 cm. No extrauterine pelvic mass evident. Other: Appendix appears unremarkable. No abscess or ascites is evident in the abdomen or pelvis. There is a focal ventral hernia slightly superior to the umbilicus containing fat and mild changes of panniculitis. This hernia has a neck measurement from right to left of 1.2 cm and from superior to inferior dimension of 0.8 cm. No bowel containing hernia evident. Musculoskeletal: There are no blastic or lytic bone lesions. There is no intramuscular or abdominal wall lesion. Review of the MIP images confirms the above findings. IMPRESSION: CT angiogram chest: 1. No thoracic aortic aneurysm or dissection. No evident pulmonary embolus. 2. Subcutaneous defibrillator present with lead tip anterior left chest wall. 3.  No lung edema or consolidation. 4.  No evident thoracic adenopathy. CT angiogram abdomen; CT angiogram pelvis: 1. No aneurysm or dissection in the aorta or major mesenteric/pelvic arterial vessels. No appreciable atherosclerotic change noted in the arterial vessels in the abdomen or pelvis. 2. Focal ventral hernia slightly superior to the umbilicus which contains fat and mild panniculitis. No bowel containing hernia evident. 3.  Leiomyomatous uterus. 4.  No bowel obstruction.  No  abscess.  No ascites. 5.  No renal or ureteral calculus.  No hydronephrosis. Electronically Signed   By: Lowella Grip III M.D.   On: 07/02/2017 12:05    Microbiology: No results found for this or any previous visit (from the past 240 hour(s)).   Labs: Basic Metabolic Panel: Recent Labs  Lab 07/02/17 1007 07/03/17 0239 07/04/17 0439  NA 141 138 136  K 3.8 3.4* 4.3  CL 107 103 103  CO2  --  24 22  GLUCOSE 102* 155* 188*  BUN 16 12 18   CREATININE 1.20* 1.19* 1.32*  CALCIUM  --  9.8 9.7   Liver Function Tests: Recent Labs  Lab 07/03/17 0239  AST 31  ALT 22  ALKPHOS 90  BILITOT 0.6  PROT 7.9  ALBUMIN 3.8   No results for input(s): LIPASE, AMYLASE in the last 168 hours. No results for input(s): AMMONIA in the last 168 hours. CBC: Recent Labs  Lab 07/02/17 1000 07/02/17 1007 07/03/17 0239  WBC 6.6  --  7.3  NEUTROABS 3.0  --   --   HGB 12.3 12.6 11.3*  HCT 37.1 37.0 34.6*  MCV 91.4  --  91.5  PLT 270  --  255   Cardiac Enzymes: Recent Labs  Lab 07/02/17 2047 07/03/17 0239 07/03/17 0931  TROPONINI <0.03 0.03* <0.03   BNP:  Signed:  Oswald Hillock MD.  [facility] 07/05/2017, 9:58 AM

## 2017-07-05 NOTE — Progress Notes (Signed)
Pt given discharge teaching/instructions including medications and schedules. Pt verbalized understanding of all discharge iinstructions. Discharged to home via wheelchair Discharge packet including Prescriptions with Pt at time of discharge

## 2017-07-05 NOTE — Progress Notes (Signed)
Progress Note  Patient Name: Kathy Bruce Date of Encounter: 07/05/2017  Primary Cardiologist: No primary care provider on file.   Subjective   Continues to complain of chest wall pain  Inpatient Medications    Scheduled Meds: . aspirin EC  81 mg Oral Daily  . carvedilol  12.5 mg Oral BID WC  . cloNIDine  0.1 mg Oral Daily  . colchicine  0.6 mg Oral BID  . enoxaparin (LOVENOX) injection  40 mg Subcutaneous Q24H  . furosemide  20 mg Oral Daily  . ibuprofen  600 mg Oral QID  . losartan  25 mg Oral Daily  . pantoprazole  40 mg Oral Daily  . [START ON 07/06/2017] predniSONE  10 mg Oral Q breakfast  . sodium chloride flush  3 mL Intravenous Q12H  . spironolactone  25 mg Oral Daily  . zolpidem  5 mg Oral QHS   Continuous Infusions: . sodium chloride     PRN Meds: sodium chloride, acetaminophen **OR** acetaminophen, hydrALAZINE, HYDROmorphone (DILAUDID) injection, sodium chloride flush, tetrahydrozoline   Vital Signs    Vitals:   07/05/17 0034 07/05/17 0216 07/05/17 0510 07/05/17 0721  BP: (!) 158/103 (!) 141/75 129/88   Pulse: 84 78 80   Resp:   18   Temp:   98 F (36.7 C)   TempSrc:   Oral   SpO2:   97%   Weight:    181 lb 14.1 oz (82.5 kg)  Height:        Intake/Output Summary (Last 24 hours) at 07/05/2017 0805 Last data filed at 07/04/2017 1700 Gross per 24 hour  Intake 640 ml  Output -  Net 640 ml   Filed Weights   07/03/17 0500 07/04/17 0519 07/05/17 0721  Weight: 184 lb 15.5 oz (83.9 kg) 184 lb 4.9 oz (83.6 kg) 181 lb 14.1 oz (82.5 kg)    Telemetry    NSR - Personally Reviewed  ECG    No new EKG to review - Personally Reviewed  Physical Exam   GEN: No acute distress.   Neck: No JVD Cardiac: RRR, no murmurs, rubs, or gallops.  Respiratory: Clear to auscultation bilaterally. GI: Soft, nontender, non-distended  MS: No edema; No deformity. Neuro:  Nonfocal  Psych: Normal affect   Labs    Chemistry Recent Labs  Lab 07/02/17 1007  07/03/17 0239 07/04/17 0439  NA 141 138 136  K 3.8 3.4* 4.3  CL 107 103 103  CO2  --  24 22  GLUCOSE 102* 155* 188*  BUN 16 12 18   CREATININE 1.20* 1.19* 1.32*  CALCIUM  --  9.8 9.7  PROT  --  7.9  --   ALBUMIN  --  3.8  --   AST  --  31  --   ALT  --  22  --   ALKPHOS  --  90  --   BILITOT  --  0.6  --   GFRNONAA  --  52* 46*  GFRAA  --  >60 53*  ANIONGAP  --  11 11     Hematology Recent Labs  Lab 07/02/17 1000 07/02/17 1007 07/03/17 0239  WBC 6.6  --  7.3  RBC 4.06  --  3.78*  HGB 12.3 12.6 11.3*  HCT 37.1 37.0 34.6*  MCV 91.4  --  91.5  MCH 30.3  --  29.9  MCHC 33.2  --  32.7  RDW 14.7  --  15.0  PLT 270  --  255  Cardiac Enzymes Recent Labs  Lab 07/02/17 2047 07/03/17 0239 07/03/17 0931  TROPONINI <0.03 0.03* <0.03    Recent Labs  Lab 07/02/17 1006  TROPIPOC 0.00     BNPNo results for input(s): BNP, PROBNP in the last 168 hours.   DDimer No results for input(s): DDIMER in the last 168 hours.   Radiology    No results found.  Cardiac Studies   Study Conclusions  - Left ventricle: The cavity size was mildly dilated. Wall thickness was normal. Systolic function was moderately to severely reduced. The estimated ejection fraction was in the range of 30% to 35%. Moderate diffuse hypokinesis with no identifiable regional variations. Features are consistent with a pseudonormal left ventricular filling pattern, with concomitant abnormal relaxation and increased filling pressure (grade 2 diastolic dysfunction). - Mitral valve: There was moderate to severe regurgitation directed centrally. The acceleration rate of the regurgitant jet was reduced, consistent with a low dP/dt. - Left atrium: The atrium was mildly dilated.    Patient Profile     52 y.o. female who is being seen today for the evaluation of chest pain at the request of Dr. Alvino Chapel. Kathy Bruce recently moved to [place] from [place], [place] - she has a  histrory of CAD, prior stent and is s/p AICD for ischemic cardiomyopathy with LVEF of ~25% per her report. Interestingly, this appear to be a subcutaneous ICD device. She is reporting several days of back pain and more recently chest pain - there is significant chest wall tenderness over the area of the lead. She also reports some orthopnea and PND. BP on arrival was markedly elevated and she says she has been out of her medication for >1 month.    Assessment & Plan    1. Chest Pain - EKG showed no acute ST changes. Trop neg x 3 - Suspect chest pain is secondary to longstanding HTN, CHF - pleuritic chest pain, ?possible pericarditis given history (worse with movement, deep inspiration, and lying supine;+chest wall tenderness; recent viral illness). - ESR elevated but no rub on exam and ECG not c/w this. - CTAshowed no aortic dissection and no obvious PE - started on Colchicine.  Still with some chest wall pain. - high dose Prednisone was stopped.  She is chronically on low dose for gout which she will continue.   2. HTN - Longstanding history of uncontrolled HTN. Patient states her BP is normally 180s/100s with medicine (Clonidine and Carvedilol). - BPinED was 230/130.  - BP much improved and today 129/83mHg - adding losartan for CHF which will help with BP.  3. CHF - Patient reports she was diagnosed with CHF following an MI in 2013 and had to have AICD placed. [place] need to get outside recordsif possible. - echo showed moderate to severely reduced LVF with EF 30-35% with moderate to severe MR. - continue carvedilol 12.53mBID.   - Added Losartan 259maily.  If [facility] tolerates this [facility] - Added spironolactone 22m68mily.   - continue PO Lasix - dose not appear volume overloaded. - follow renal function closely with initiation of spiro and ARB.  Creatinine slightly bumped at 1.32 today.   4. CAD - Patient  reports she had a MI in 2013 and had a stent placed. Will work on getting outside records. - Continue ASA, BB and statin.  5. Compliance - encourage compliance w/ rx, try to limit costs  6.  Moderate to  severe MR - secondary due to annular dilatation and LV dysfunction.  Adding ARB.  No new recs at this time.  OK for discharge from cardiac standpoint. Will sign off.  Please have her followup with Dr. Debara Pickett outpt  For questions or updates, please contact [facility] Please consult [email or web address] for contact info under Cardiology/STEMI.      Signed, Fransico Him, MD  07/05/2017, 8:05 AM

## 2017-07-20 ENCOUNTER — Encounter: Payer: Self-pay | Admitting: Gastroenterology

## 2017-07-29 ENCOUNTER — Telehealth: Payer: Self-pay | Admitting: *Deleted

## 2017-07-29 NOTE — Telephone Encounter (Signed)
Called patient, no answer, left message for patient to call us back to make OV with Dr. Havery Moros. Colonoscopy cancelled at this time.

## 2017-07-29 NOTE — Telephone Encounter (Signed)
Office visit scheduled with Dr. Havery Moros.

## 2017-07-29 NOTE — Telephone Encounter (Signed)
Patient is scheduled for direct screening at [facility] but was recently in ED for chest pain. Echo was done EF 30-35%. She is to see cardiologist on  08/20/17.  She does have a ICD. Would you like patient to have OV 1st or direct hospital colonoscopy? Please advise. Thank you, Robbin pv

## 2017-07-29 NOTE — Telephone Encounter (Signed)
Thanks Robbin, she should be seen in clinic first if you can cancel her colonoscopy and help arrange clinic visit with me.

## 2017-08-18 ENCOUNTER — Telehealth: Payer: Self-pay | Admitting: Cardiology

## 2017-08-18 NOTE — Telephone Encounter (Signed)
Received incoming records from [facility] for upcoming appointment on 08/20/17 @ 9:40am with Dr. Ellyn Hack. Records located in Medical Records. 08/18/17 ab

## 2017-08-20 ENCOUNTER — Encounter: Payer: Self-pay | Admitting: Cardiology

## 2017-08-20 ENCOUNTER — Ambulatory Visit: Payer: Medicaid Other | Admitting: Cardiology

## 2017-08-20 ENCOUNTER — Other Ambulatory Visit: Payer: Self-pay | Admitting: Family Medicine

## 2017-08-20 VITALS — BP 176/120 | HR 73 | Ht 62.0 in | Wt 186.2 lb

## 2017-08-20 DIAGNOSIS — I420 Dilated cardiomyopathy: Secondary | ICD-10-CM

## 2017-08-20 DIAGNOSIS — I25119 Atherosclerotic heart disease of native coronary artery with unspecified angina pectoris: Secondary | ICD-10-CM

## 2017-08-20 DIAGNOSIS — R079 Chest pain, unspecified: Secondary | ICD-10-CM | POA: Diagnosis not present

## 2017-08-20 DIAGNOSIS — E7849 Other hyperlipidemia: Secondary | ICD-10-CM

## 2017-08-20 DIAGNOSIS — I5042 Chronic combined systolic (congestive) and diastolic (congestive) heart failure: Secondary | ICD-10-CM

## 2017-08-20 DIAGNOSIS — I110 Hypertensive heart disease with heart failure: Secondary | ICD-10-CM

## 2017-08-20 DIAGNOSIS — Z95810 Presence of automatic (implantable) cardiac defibrillator: Secondary | ICD-10-CM

## 2017-08-20 DIAGNOSIS — Z1231 Encounter for screening mammogram for malignant neoplasm of breast: Secondary | ICD-10-CM

## 2017-08-20 DIAGNOSIS — Z79899 Other long term (current) drug therapy: Secondary | ICD-10-CM

## 2017-08-20 MED ORDER — LOSARTAN POTASSIUM 50 MG PO TABS: 50.0000 mg | ORAL_TABLET | Freq: Every day | ORAL | 11 refills | Status: DC

## 2017-08-20 MED ORDER — FUROSEMIDE 40 MG PO TABS: 40.0000 mg | ORAL_TABLET | Freq: Every day | ORAL | 11 refills | Status: AC

## 2017-08-20 MED ORDER — ATORVASTATIN CALCIUM 40 MG PO TABS: 40.0000 mg | ORAL_TABLET | Freq: Every day | ORAL | 11 refills | Status: AC

## 2017-08-20 MED ORDER — ISOSORBIDE MONONITRATE ER 30 MG PO TB24: 30.0000 mg | ORAL_TABLET | Freq: Every day | ORAL | 11 refills | Status: AC

## 2017-08-20 NOTE — Patient Instructions (Addendum)
MEDICATION INSTRUCTIONS  RESTART ATORVASTATIN 40 MG AT BEDTIME  ---START ISOSORBIDE MONO (IMDUR) 30 MG DAILY ( YOU MAY TAKE YOUR DAILY 81 MG ASPIRIN PRIOR TO TAKING THIS MEDICATION IT WIILL HELP WITH HEADACHE)  ----INCREASE LOSARTAN TO 50 MG  DAILY ( NEW PRESCRIPTION SENT TO PHARMACY)   ---- INCREASE FUROSEMIDE TO 40 MG ( NEW PRESCRIPTION SENT TO PHARMACY)      IN 2 WEEKS ( WEEK OF MARCH 25 )  LABS BMP  AND IN 3 MONTHS (JUNE 2019)  MORE LABS WILL YOU LAB SLIP AT THAT TIME LIPID CMP       Your physician recommends that you schedule a follow-up appointment in [place]      Your physician wants you to follow-up in 3 MONTHS WITH DR HARDING. You will receive a reminder letter in the mail two months in advance. If you don't receive a letter, please call our office to schedule the follow-up appointment.    If you need a refill on your cardiac medications before your next appointment, please call your pharmacy.

## 2017-08-20 NOTE — Progress Notes (Signed)
PCP: Kathy Rasmussen, MD Prior Cardiologist: cardiologist at [facility] in [facility], Dr. Hansel Bruce  Clinic Note: Chief Complaint  Patient presents with  . Hospitalization Follow-up    Heart failure exacerbation  . Cardiomyopathy  . Congestive Heart Failure    Chronic combined systolic and diastolic    HPI: Kathy Bruce is a 52 y.o. female with a PMH below who presents today for hospital follow-up.  Seen by Dr. Debara Bruce in the hospital. -- Appt not made on d/c -- seen today @ the request of Dr. Noreene Bruce.    2013 - in [place] - told had CHF-->> She apparently recently moved to [place] from [place].  (Prior to that lived in [place]) She has a ? history of CAD with MI in 2013 & 2014 --> EF of 30-35% and has Left Lateral Subcutaneous ICD in place. -- (She is not aware of STENTS -- does not recall having to take Plavix/Briilita or Effient -- was told "heart arteries looked good" --> ) ? If MI was more related to CHF.  Mother, Brother -- MI (brother died post MI).  Per her report EF was originally 25%  Kathy Bruce was last seen for cardiology consultation on July 02, 2017 by Dr. Debara Bruce.  She presented with chest wall tenderness over the ICD.  She presented with profound hypertension/hypertensive urgency.  Pain thought to be chest wall pain versus possible pericarditis.  Pericarditis was then discounted.  Initial blood pressure on arrival was 180s /100s.  Recent Hospitalizations:   He was admitted to the hospital January 24-27.  Chest pain with moderate risk.  Thought to be most likely musculoskeletal.-->  She has been out of her cardiac medications for roughly 1 month --> discharged on carvedilol 12.5 mg daily, losartan 25 mg daily and spironolactone 25 mg daily   along with oral Lasix 40 mg daily.    Studies Personally Reviewed - (if available, images/films reviewed: From Epic Chart or Care Everywhere)  During January hospitalization - 2D Echo: Mild  LV dilation.  Normal thickness.  Mildly reduced EF of 30-35%.  Moderate diffuse HK.  GRII DD.  Moderate-severe MR.  Mild LA dilation   Interval History: Kathy Bruce presents here today for follow-up stating that she is breathing much better than she was in the hospital.  She still has dyspnea, but it is better.  Swelling is still there but also better.  She says that prior to moving here she had been hospitalized at least 2 or 3 times a year since 2013.  Seems to be some confusion as to whether or not she did have a true MI with CAD.  She does not recall having been on Plavix, Effient or Brilinta which would have been for a stent. Interestingly though, she does have intermittent chest discomfort for which she takes nitroglycerin at least once or twice a week.  She has 5 pillow PND and significant orthopnea, but edema seems to be better controlled.  She notes occasional flip flopping skipping beats, but no prolonged rapid irregular heartbeat/irregular rhythms. No lightheadedness, dizziness weakness or syncope/near syncope. No TIA/amaurosis fugax symptoms. No melena, hematochezia, hematuria, or epstaxis. No claudication.  ROS: A comprehensive was performed. Review of Systems  Constitutional: Positive for malaise/fatigue.  HENT: Negative for congestion and nosebleeds.   Respiratory: Positive for cough (Sometimes in the mornings) and shortness of breath. Negative for wheezing.   Gastrointestinal: Negative for abdominal pain, blood in stool, heartburn and melena.  Genitourinary: Negative for hematuria.  Musculoskeletal: Positive for joint pain.  Neurological: Negative for dizziness.  Psychiatric/Behavioral: Negative for memory loss. The patient is nervous/anxious. The patient does not have insomnia.   All other systems reviewed and are negative.  I have reviewed and (if needed) personally updated the patient's problem list, medications, allergies, past medical and surgical history, social and family  history.   Past Medical History:  Diagnosis Date  . Coronary artery disease involving native heart with angina pectoris [facility]) 2014   Patient reports MI in 2013 (in [place]) -- 2nd [place] 2014 in [place], [place] --? stent placement when patient was living in [place]. Need outside records.   . Dilated cardiomyopathy ([place]) 2013   Patient reports this was secondary to MI and she to have AICD placed.   . Hypertensive heart disease with chronic combined systolic and diastolic congestive heart failure ([place]) 2013  . ICD (implantable cardioverter-defibrillator) in place, [facility], 2013 08/29/2013   [facility] in [place], (DR. Bunker)    Past Surgical History:  Procedure Laterality Date  . [employer] ICD  . TRANSTHORACIC ECHOCARDIOGRAM  06/2017   Mild LV dilation.  Normal thickness.  Mildly reduced EF of 30-35%.  Moderate diffuse HK.  GRII DD.  Moderate-severe MR.  Mild LA dilation     Current Meds  Medication Sig  . aspirin EC 81 MG tablet Take 1 tablet (81 mg total) by mouth daily.  . baclofen (LIORESAL) 10 MG tablet Take 10 mg by mouth 3 (three) times daily.  . carvedilol (COREG) 12.5 MG tablet Take 1 tablet (12.5 mg total) by mouth 2 (two) times daily with a meal.  . cloNIDine (CATAPRES) 0.1 MG tablet Take 1 tablet (0.1 mg total) by mouth daily.  . colchicine 0.6 MG tablet Take 1 tablet (0.6 mg total) by mouth 2 (two) times daily.  . naproxen (NAPROSYN) 500 MG tablet Take 500 mg by mouth 2 (two) times daily as needed for mild pain.  . nitroGLYCERIN (NITROSTAT) 0.4 MG SL tablet Place 0.4 mg under the tongue every 5 (five) minutes as needed for chest pain.  Marland Kitchen omeprazole (PRILOSEC) 40 MG capsule Take 1 capsule (40 mg total) by mouth daily.  Marland Kitchen oxyCODONE-acetaminophen (PERCOCET) 10-325 MG tablet Take 1 tablet by mouth every 6 (six) hours as needed for pain.  . predniSONE (DELTASONE) 10 MG tablet Take 1 tablet (10 mg  total) by mouth daily with breakfast.  . tetrahydrozoline (VISINE) 0.05 % ophthalmic solution Place 2 drops into both eyes daily as needed (DRY EYES).  Marland Kitchen zolpidem (AMBIEN) 5 MG tablet Take 1 tablet (5 mg total) by mouth at bedtime.  . [DISCONTINUED] furosemide (LASIX) 20 MG tablet Take 1 tablet (20 mg total) by mouth daily.  . [DISCONTINUED] losartan (COZAAR) 25 MG tablet Take 1 tablet (25 mg total) by mouth daily.    Not on File  Social History   Tobacco Use  . Smoking status: Never Smoker  . Smokeless tobacco: Never Used  Substance Use Topics  . Alcohol use: Yes    Frequency: Never    Comment: Occasionally  . Drug use: No   Social History   Social History Narrative   Recently moved to [place] from [place] in October 2018. Lives in an apartment with her husband.    family history includes Arthritis in her sister; Diabetes in her daughter and mother; Gout in her son; Heart attack in her brother; Hypertension in her brother, sister,  and son; Hyperthyroidism in her brother and mother; Tuberculosis in her father.  Wt Readings from Last 3 Encounters:  08/20/17 186 lb 3.2 oz (84.5 kg)  07/05/17 181 lb 14.1 oz (82.5 kg)    PHYSICAL EXAM BP (!) 176/120 (BP Location: Right Arm)   Pulse 73   Ht 5\' 2"  (1.575 m)   Wt 186 lb 3.2 oz (84.5 kg)   SpO2 99%   BMI 34.06 kg/m  Physical Exam  Constitutional: She is oriented to person, place, and time. She appears well-developed. No distress.  Chronically ill-appearing.  No obvious acute distress.  Well-groomed.  HENT:  Head: Normocephalic and atraumatic.  Eyes: EOM are normal.  Neck: Hepatojugular reflux and JVD (8-10 cmH2O) present. Carotid bruit is not present.  Cardiovascular: Normal rate, regular rhythm, S1 normal and intact distal pulses.  Extrasystoles are present. PMI is displaced (Mild lateral displacement). Exam reveals no gallop and no friction rub.  Murmur heard.  Blowing holosystolic murmur is present with a grade of  3/6 at the lower left sternal border and apex. Split S2 -> makes it difficult to tell if there is a gallop or not.  Pulmonary/Chest: Effort normal. No respiratory distress. She has no rales. She exhibits tenderness.  Mild diffuse interstitial breath sounds, no rales or rhonchi.  Abdominal: Soft. Bowel sounds are normal. She exhibits no distension. There is no tenderness. There is no rebound.  Musculoskeletal: Normal range of motion. She exhibits edema (Trace-1+).  Neurological: She is alert and oriented to person, place, and time. A cranial nerve deficit is present.  Skin: Skin is warm and dry.  Psychiatric: She has a normal mood and affect. Her behavior is normal. Judgment and thought content normal.  Nursing note and vitals reviewed.    Adult ECG Report  Rate: 73 ;  Rhythm: normal sinus rhythm and LVH with repolarization changes (T wave inversions in anterolateral leads).  Otherwise normal intervals and durations.;   Narrative Interpretation: Stable EKG; no significant change.   Other studies Reviewed: Additional studies/ records that were reviewed today include:  Recent Labs:    Lab Results  Component Value Date   CHOL 256 (H) 07/03/2017   HDL 59 07/03/2017   LDLCALC 160 (H) 07/03/2017   TRIG 187 (H) 07/03/2017   CHOLHDL 4.3 07/03/2017   Lab Results  Component Value Date   CREATININE 1.32 (H) 07/04/2017   BUN 18 07/04/2017   NA 136 07/04/2017   K 4.3 07/04/2017   CL 103 07/04/2017   CO2 22 07/04/2017    ASSESSMENT / PLAN: Problem List Items Addressed This Visit    ICD (implantable cardioverter-defibrillator) in place, [facility], 2013 (Chronic)    Needs to beNotified by our EP service for following up her device.  Apparently was close to ERI and therefore needs close monitoring.      Hypertensive heart disease with chronic combined systolic and diastolic congestive heart failure (HCC) (Chronic)    Significant hypertensive heart disease with LVH and diastolic dysfunction  noted.  She seems to be pushing borderline euvolemic levels, but still needs continued diuresis given the JVD and edema. Plan: Increase Lasix to 40 mg daily (will hold off on spironolactone until we see how she does if we switch from losartan to [facility]).  Continue carvedilol which we should be at increased up to 25 mg twice daily and follow-up visits.      Relevant Medications   nitroGLYCERIN (NITROSTAT) 0.4 MG SL tablet   isosorbide mononitrate (IMDUR) 30 MG 24  hr tablet   losartan (COZAAR) 50 MG tablet   furosemide (LASIX) 40 MG tablet   atorvastatin (LIPITOR) 40 MG tablet   Other Relevant Orders   Basic metabolic panel   Hyperlipidemia due to dietary fat intake (Chronic)    Very poorly controlled lipids based on recent check.  Need to restart atorvastatin.  Discussed dietary modifications.      Relevant Medications   nitroGLYCERIN (NITROSTAT) 0.4 MG SL tablet   isosorbide mononitrate (IMDUR) 30 MG 24 hr tablet   losartan (COZAAR) 50 MG tablet   furosemide (LASIX) 40 MG tablet   atorvastatin (LIPITOR) 40 MG tablet   Dilated cardiomyopathy (HCC) - Primary (Chronic)    At least 6-year history of dilated cardiomyopathy not sure to full details.  She is status post left lateral subcutaneous ICD placement.   Needs to be established with the defibrillator clinic. Need to get outside records to know details. On carvedilol and losartan with plans to eventually switch to [facility] if available) titrate losartan up to 50 mg daily.--  .  Also on 20 mg Lasix which I am increasing to 40 mg. Was supposed to be on spironolactone, that is not currently listed either.  This would be next.  Plan to convert from losartan to [facility] in follow-up.      Relevant Medications   nitroGLYCERIN (NITROSTAT) 0.4 MG SL tablet   isosorbide mononitrate (IMDUR) 30 MG 24 hr tablet   losartan (COZAAR) 50 MG tablet   furosemide (LASIX) 40 MG tablet   atorvastatin (LIPITOR) 40 MG tablet   Other Relevant Orders    EKG 12-Lead (Completed)   Basic metabolic panel   Coronary artery disease involving native heart with angina pectoris (HCC) (Chronic)    By report, there was at least 2 MIs with possible stents, but she does not recall stents.  She does have symptoms concerning for angina however.  We need to get her outside records. Continue aspirin, beta-blocker, and add isosorbide mononitrate. Reinitiate atorvastatin.      Relevant Medications   nitroGLYCERIN (NITROSTAT) 0.4 MG SL tablet   isosorbide mononitrate (IMDUR) 30 MG 24 hr tablet   losartan (COZAAR) 50 MG tablet   furosemide (LASIX) 40 MG tablet   atorvastatin (LIPITOR) 40 MG tablet   Other Relevant Orders   EKG 12-Lead (Completed)   Chest pain with moderate risk for cardiac etiology    Need to find out what her CAD risk and history was.  She is having lots of discomfort in the may be because of hypertension volume overload still.  Currently we will plan to add Imdur while we are waiting to get results from outside hospitals. Low threshold for titrating up beta-blocker, but will hold off until we get her on Entresto or not.      Relevant Orders   EKG 12-Lead (Completed)   Basic metabolic panel    Other Visit Diagnoses    Medication management         Increasing ARB and Lasix dose with plans to potentially restart spironolactone.  Will need labs in roughly 2 weeks. Recheck chemistry labs with lipids prior to 34-month follow-up. Get outside records from [place].  Current medicines are reviewed at length with the patient today. (+/- concerns) not sure what all she is taking, was on stating prior to admission. The following changes have been made:see below  Patient Instructions  MEDICATION INSTRUCTIONS  RESTART ATORVASTATIN 40 MG AT BEDTIME  ---START ISOSORBIDE MONO (IMDUR) 30 MG DAILY (  YOU MAY TAKE YOUR DAILY 13 MG ASPIRIN PRIOR TO TAKING THIS MEDICATION IT WIILL HELP WITH HEADACHE)  ----INCREASE LOSARTAN TO 50 MG   DAILY ( NEW PRESCRIPTION SENT TO PHARMACY)   ---- INCREASE FUROSEMIDE TO 40 MG ( NEW PRESCRIPTION SENT TO PHARMACY)      IN 2 WEEKS ( WEEK OF MARCH 25 )  LABS BMP  AND IN 3 MONTHS (JUNE 2019)  MORE LABS WILL YOU LAB SLIP AT THAT TIME LIPID CMP       Your physician recommends that you schedule a follow-up appointment in [place]      Your physician wants you to follow-up in 3 MONTHS WITH DR Fredrika Canby. You will receive a reminder letter in the mail two months in advance. If you don't receive a letter, please call our office to schedule the follow-up appointment.    If you need a refill on your cardiac medications before your next appointment, please call your pharmacy.    Studies Ordered:   Orders Placed This Encounter  Procedures  . Basic metabolic panel  . EKG 12-Lead      Glenetta Hew, M.D., M.S. Interventional Cardiologist   Pager # [phone] Phone # [phone] [address] [place], [place] [address]   Thank you for choosing [facility] at [facility]!!

## 2017-08-22 ENCOUNTER — Encounter: Payer: Self-pay | Admitting: Cardiology

## 2017-08-22 NOTE — Assessment & Plan Note (Signed)
Significant hypertensive heart disease with LVH and diastolic dysfunction noted.  She seems to be pushing borderline euvolemic levels, but still needs continued diuresis given the JVD and edema. Plan: Increase Lasix to 40 mg daily (will hold off on spironolactone until we see how she does if we switch from losartan to [facility]).  Continue carvedilol which we should be at increased up to 25 mg twice daily and follow-up visits.

## 2017-08-22 NOTE — Assessment & Plan Note (Signed)
Very poorly controlled lipids based on recent check.  Need to restart atorvastatin.  Discussed dietary modifications.

## 2017-08-22 NOTE — Assessment & Plan Note (Signed)
Needs to beNotified by our EP service for following up her device.  Apparently was close to ERI and therefore needs close monitoring.

## 2017-08-22 NOTE — Assessment & Plan Note (Signed)
By report, there was at least 2 MIs with possible stents, but she does not recall stents.  She does have symptoms concerning for angina however.  We need to get her outside records. Continue aspirin, beta-blocker, and add isosorbide mononitrate. Reinitiate atorvastatin.

## 2017-08-22 NOTE — Assessment & Plan Note (Signed)
At least 6-year history of dilated cardiomyopathy not sure to full details.  She is status post left lateral subcutaneous ICD placement.   Needs to be established with the defibrillator clinic. Need to get outside records to know details. On carvedilol and losartan with plans to eventually switch to [facility] if available) titrate losartan up to 50 mg daily.--  .  Also on 20 mg Lasix which I am increasing to 40 mg. Was supposed to be on spironolactone, that is not currently listed either.  This would be next.  Plan to convert from losartan to [facility] in follow-up.

## 2017-08-22 NOTE — Assessment & Plan Note (Signed)
Need to find out what her CAD risk and history was.  She is having lots of discomfort in the may be because of hypertension volume overload still.  Currently we will plan to add Imdur while we are waiting to get results from outside hospitals. Low threshold for titrating up beta-blocker, but will hold off until we get her on Entresto or not.

## 2017-08-31 ENCOUNTER — Emergency Department (HOSPITAL_COMMUNITY): Payer: Medicaid Other

## 2017-08-31 ENCOUNTER — Other Ambulatory Visit: Payer: Self-pay

## 2017-08-31 ENCOUNTER — Inpatient Hospital Stay (HOSPITAL_COMMUNITY)
Admission: EM | Admit: 2017-08-31 | Discharge: 2017-09-06 | DRG: 872 | Disposition: A | Discharge status: Home Health | Payer: Medicaid Other | Attending: Internal Medicine | Admitting: Internal Medicine

## 2017-08-31 ENCOUNTER — Encounter (HOSPITAL_COMMUNITY): Payer: Self-pay | Admitting: Emergency Medicine

## 2017-08-31 DIAGNOSIS — R748 Abnormal levels of other serum enzymes: Secondary | ICD-10-CM | POA: Diagnosis present

## 2017-08-31 DIAGNOSIS — M109 Gout, unspecified: Secondary | ICD-10-CM | POA: Diagnosis not present

## 2017-08-31 DIAGNOSIS — Z7952 Long term (current) use of systemic steroids: Secondary | ICD-10-CM | POA: Diagnosis not present

## 2017-08-31 DIAGNOSIS — E872 Acidosis, unspecified: Secondary | ICD-10-CM

## 2017-08-31 DIAGNOSIS — N183 Chronic kidney disease, stage 3 (moderate): Secondary | ICD-10-CM | POA: Diagnosis present

## 2017-08-31 DIAGNOSIS — R079 Chest pain, unspecified: Secondary | ICD-10-CM | POA: Diagnosis not present

## 2017-08-31 DIAGNOSIS — N390 Urinary tract infection, site not specified: Secondary | ICD-10-CM | POA: Diagnosis present

## 2017-08-31 DIAGNOSIS — I252 Old myocardial infarction: Secondary | ICD-10-CM | POA: Diagnosis not present

## 2017-08-31 DIAGNOSIS — M1A9XX0 Chronic gout, unspecified, without tophus (tophi): Secondary | ICD-10-CM | POA: Diagnosis present

## 2017-08-31 DIAGNOSIS — I10 Essential (primary) hypertension: Secondary | ICD-10-CM | POA: Diagnosis not present

## 2017-08-31 DIAGNOSIS — N289 Disorder of kidney and ureter, unspecified: Secondary | ICD-10-CM | POA: Diagnosis not present

## 2017-08-31 DIAGNOSIS — I2510 Atherosclerotic heart disease of native coronary artery without angina pectoris: Secondary | ICD-10-CM | POA: Diagnosis present

## 2017-08-31 DIAGNOSIS — Z79899 Other long term (current) drug therapy: Secondary | ICD-10-CM

## 2017-08-31 DIAGNOSIS — M1990 Unspecified osteoarthritis, unspecified site: Secondary | ICD-10-CM | POA: Diagnosis present

## 2017-08-31 DIAGNOSIS — A419 Sepsis, unspecified organism: Principal | ICD-10-CM | POA: Diagnosis present

## 2017-08-31 DIAGNOSIS — Z95810 Presence of automatic (implantable) cardiac defibrillator: Secondary | ICD-10-CM | POA: Diagnosis not present

## 2017-08-31 DIAGNOSIS — R6510 Systemic inflammatory response syndrome (SIRS) of non-infectious origin without acute organ dysfunction: Secondary | ICD-10-CM | POA: Diagnosis not present

## 2017-08-31 DIAGNOSIS — I25119 Atherosclerotic heart disease of native coronary artery with unspecified angina pectoris: Secondary | ICD-10-CM

## 2017-08-31 DIAGNOSIS — I5042 Chronic combined systolic (congestive) and diastolic (congestive) heart failure: Secondary | ICD-10-CM | POA: Diagnosis present

## 2017-08-31 DIAGNOSIS — I420 Dilated cardiomyopathy: Secondary | ICD-10-CM | POA: Diagnosis present

## 2017-08-31 DIAGNOSIS — I130 Hypertensive heart and chronic kidney disease with heart failure and stage 1 through stage 4 chronic kidney disease, or unspecified chronic kidney disease: Secondary | ICD-10-CM | POA: Diagnosis present

## 2017-08-31 DIAGNOSIS — Z7982 Long term (current) use of aspirin: Secondary | ICD-10-CM

## 2017-08-31 DIAGNOSIS — M10011 Idiopathic gout, right shoulder: Secondary | ICD-10-CM | POA: Diagnosis not present

## 2017-08-31 DIAGNOSIS — M1000 Idiopathic gout, unspecified site: Secondary | ICD-10-CM | POA: Diagnosis not present

## 2017-08-31 LAB — URINALYSIS, ROUTINE W REFLEX MICROSCOPIC
Glucose, UA: NEGATIVE mg/dL
Ketones, ur: NEGATIVE mg/dL
Nitrite: NEGATIVE
Protein, ur: >=300 mg/dL — AB
Specific Gravity, Urine: 1.024 (ref 1.005–1.030)
pH: 5 (ref 5.0–8.0)

## 2017-08-31 LAB — HCG, QUANTITATIVE, PREGNANCY: hCG, Beta Chain, Quant, S: 1 m[IU]/mL (ref <5)

## 2017-08-31 LAB — COMPREHENSIVE METABOLIC PANEL
ALBUMIN: 3.8 g/dL (ref 3.5–5.0)
ALT: 72 U/L — AB (ref 14–54)
AST: 93 U/L — AB (ref 15–41)
Alkaline Phosphatase: 170 U/L — ABNORMAL HIGH (ref 38–126)
Anion gap: 14 (ref 5–15)
BUN: 13 mg/dL (ref 6–20)
CO2: 25 mmol/L (ref 22–32)
CREATININE: 1.43 mg/dL — AB (ref 0.44–1.00)
Calcium: 10.1 mg/dL (ref 8.9–10.3)
Chloride: 94 mmol/L — ABNORMAL LOW (ref 101–111)
GFR calc Af Amer: 48 mL/min — ABNORMAL LOW (ref >60)
GFR calc non Af Amer: 42 mL/min — ABNORMAL LOW (ref >60)
GLUCOSE: 138 mg/dL — AB (ref 65–99)
Potassium: 3.6 mmol/L (ref 3.5–5.1)
SODIUM: 133 mmol/L — AB (ref 135–145)
Total Bilirubin: 2.7 mg/dL — ABNORMAL HIGH (ref 0.3–1.2)
Total Protein: 9.6 g/dL — ABNORMAL HIGH (ref 6.5–8.1)

## 2017-08-31 LAB — CBC WITH DIFFERENTIAL/PLATELET
BASOS ABS: 0 10*3/uL (ref 0.0–0.1)
BASOS PCT: 0 %
Eosinophils Absolute: 0 10*3/uL (ref 0.0–0.7)
Eosinophils Relative: 0 %
HCT: 37.4 % (ref 36.0–46.0)
Hemoglobin: 12.6 g/dL (ref 12.0–15.0)
LYMPHS ABS: 1.8 10*3/uL (ref 0.7–4.0)
Lymphocytes Relative: 12 %
MCH: 29.6 pg (ref 26.0–34.0)
MCHC: 33.7 g/dL (ref 30.0–36.0)
MCV: 87.8 fL (ref 78.0–100.0)
MONO ABS: 2.2 10*3/uL — AB (ref 0.1–1.0)
MONOS PCT: 14 %
NEUTROS ABS: 11.5 10*3/uL — AB (ref 1.7–7.7)
NEUTROS PCT: 74 %
PLATELETS: 316 10*3/uL (ref 150–400)
RBC: 4.26 MIL/uL (ref 3.87–5.11)
RDW: 14.1 % (ref 11.5–15.5)
WBC: 15.5 10*3/uL — ABNORMAL HIGH (ref 4.0–10.5)

## 2017-08-31 LAB — I-STAT TROPONIN, ED: Troponin i, poc: 0 ng/mL (ref 0.00–0.08)

## 2017-08-31 LAB — I-STAT CG4 LACTIC ACID, ED
LACTIC ACID, VENOUS: 2.36 mmol/L — AB (ref 0.5–1.9)
Lactic Acid, Venous: 2.12 mmol/L (ref 0.5–1.9)
Lactic Acid, Venous: 1.56 mmol/L (ref 0.5–1.9)

## 2017-08-31 LAB — INFLUENZA PANEL BY PCR (TYPE A & B)
Influenza A By PCR: NEGATIVE
Influenza B By PCR: NEGATIVE

## 2017-08-31 LAB — I-STAT BETA HCG BLOOD, ED (MC, WL, AP ONLY): HCG, QUANTITATIVE: 28.9 m[IU]/mL — AB (ref <5)

## 2017-08-31 LAB — TROPONIN I: Troponin I: 0.03 ng/mL (ref <0.03)

## 2017-08-31 MED ORDER — OXYCODONE-ACETAMINOPHEN 5-325 MG PO TABS
1.0000 | ORAL_TABLET | Freq: Four times a day (QID) | ORAL | Status: DC | PRN
  Administered 2017-09-01 – 2017-09-06 (×15): 1 via ORAL
  Filled 2017-08-31 (×15): qty 1

## 2017-08-31 MED ORDER — ALBUTEROL SULFATE (2.5 MG/3ML) 0.083% IN NEBU: 2.5000 mg | INHALATION_SOLUTION | RESPIRATORY_TRACT | Status: DC | PRN

## 2017-08-31 MED ORDER — POLYVINYL ALCOHOL 1.4 % OP SOLN
2.0000 [drp] | Freq: Four times a day (QID) | OPHTHALMIC | Status: DC | PRN
  Filled 2017-08-31: qty 15

## 2017-08-31 MED ORDER — KETOROLAC TROMETHAMINE 15 MG/ML IJ SOLN: 15.0000 mg | Freq: Once | INTRAMUSCULAR | Status: DC

## 2017-08-31 MED ORDER — ONDANSETRON HCL 4 MG PO TABS
4.0000 mg | ORAL_TABLET | Freq: Four times a day (QID) | ORAL | Status: DC | PRN
  Administered 2017-09-01: 4 mg via ORAL
  Filled 2017-08-31: qty 1

## 2017-08-31 MED ORDER — ZOLPIDEM TARTRATE 5 MG PO TABS
5.0000 mg | ORAL_TABLET | Freq: Every day | ORAL | Status: DC
  Administered 2017-09-02 – 2017-09-05 (×5): 5 mg via ORAL
  Filled 2017-08-31 (×5): qty 1

## 2017-08-31 MED ORDER — ACETAMINOPHEN 325 MG PO TABS: 650.0000 mg | ORAL_TABLET | Freq: Four times a day (QID) | ORAL | Status: DC | PRN

## 2017-08-31 MED ORDER — SODIUM CHLORIDE 0.9 % IV SOLN
INTRAVENOUS | Status: AC
  Administered 2017-09-01: 03:00:00 via INTRAVENOUS

## 2017-08-31 MED ORDER — PREDNISONE 10 MG PO TABS: 10.0000 mg | ORAL_TABLET | Freq: Every day | ORAL | Status: DC

## 2017-08-31 MED ORDER — HYDROMORPHONE HCL 1 MG/ML IJ SOLN
1.0000 mg | Freq: Once | INTRAMUSCULAR | Status: AC
  Administered 2017-08-31: 1 mg via INTRAVENOUS
  Filled 2017-08-31: qty 1

## 2017-08-31 MED ORDER — HYDROMORPHONE HCL 1 MG/ML IJ SOLN
1.0000 mg | INTRAMUSCULAR | Status: AC | PRN
  Administered 2017-09-01 (×3): 1 mg via INTRAVENOUS
  Filled 2017-08-31 (×3): qty 1

## 2017-08-31 MED ORDER — CARVEDILOL 12.5 MG PO TABS
12.5000 mg | ORAL_TABLET | Freq: Two times a day (BID) | ORAL | Status: DC
  Administered 2017-09-01: 12.5 mg via ORAL
  Filled 2017-08-31: qty 1

## 2017-08-31 MED ORDER — OXYCODONE-ACETAMINOPHEN 10-325 MG PO TABS: 1.0000 | ORAL_TABLET | Freq: Four times a day (QID) | ORAL | Status: DC | PRN

## 2017-08-31 MED ORDER — COLCHICINE 0.6 MG PO TABS
0.6000 mg | ORAL_TABLET | Freq: Two times a day (BID) | ORAL | Status: DC
  Administered 2017-09-01 – 2017-09-06 (×11): 0.6 mg via ORAL
  Filled 2017-08-31 (×11): qty 1

## 2017-08-31 MED ORDER — NITROGLYCERIN 0.4 MG SL SUBL: 0.4000 mg | SUBLINGUAL_TABLET | SUBLINGUAL | Status: DC | PRN

## 2017-08-31 MED ORDER — IOPAMIDOL (ISOVUE-370) INJECTION 76%
100.0000 mL | Freq: Once | INTRAVENOUS | Status: AC | PRN
  Administered 2017-08-31: 75 mL via INTRAVENOUS

## 2017-08-31 MED ORDER — VANCOMYCIN HCL IN DEXTROSE 1-5 GM/200ML-% IV SOLN
1000.0000 mg | INTRAVENOUS | Status: DC
  Administered 2017-09-01: 1000 mg via INTRAVENOUS
  Filled 2017-08-31: qty 200

## 2017-08-31 MED ORDER — ISOSORBIDE MONONITRATE ER 30 MG PO TB24
30.0000 mg | ORAL_TABLET | Freq: Every day | ORAL | Status: DC
  Administered 2017-09-01 – 2017-09-06 (×6): 30 mg via ORAL
  Filled 2017-08-31 (×6): qty 1

## 2017-08-31 MED ORDER — FUROSEMIDE 40 MG PO TABS
40.0000 mg | ORAL_TABLET | Freq: Every day | ORAL | Status: DC
  Administered 2017-09-01: 40 mg via ORAL
  Filled 2017-08-31: qty 1

## 2017-08-31 MED ORDER — ACETAMINOPHEN 500 MG PO TABS
1000.0000 mg | ORAL_TABLET | Freq: Once | ORAL | Status: AC
Start: 2017-08-31 — End: 2017-08-31
  Administered 2017-08-31: 1000 mg via ORAL
  Filled 2017-08-31: qty 2

## 2017-08-31 MED ORDER — HYDROCORTISONE NA SUCCINATE PF 100 MG IJ SOLR
100.0000 mg | Freq: Once | INTRAMUSCULAR | Status: AC
  Administered 2017-08-31: 100 mg via INTRAVENOUS
  Filled 2017-08-31: qty 2

## 2017-08-31 MED ORDER — SODIUM CHLORIDE 0.9 % IV SOLN: INTRAVENOUS | Status: DC

## 2017-08-31 MED ORDER — ONDANSETRON HCL 4 MG/2ML IJ SOLN: 4.0000 mg | Freq: Four times a day (QID) | INTRAMUSCULAR | Status: DC | PRN

## 2017-08-31 MED ORDER — PIPERACILLIN-TAZOBACTAM 3.375 G IVPB 30 MIN
3.3750 g | Freq: Once | INTRAVENOUS | Status: AC
  Administered 2017-08-31: 3.375 g via INTRAVENOUS
  Filled 2017-08-31: qty 50

## 2017-08-31 MED ORDER — ONDANSETRON HCL 4 MG/2ML IJ SOLN
4.0000 mg | Freq: Once | INTRAMUSCULAR | Status: AC
  Administered 2017-08-31: 4 mg via INTRAVENOUS
  Filled 2017-08-31: qty 2

## 2017-08-31 MED ORDER — LOSARTAN POTASSIUM 50 MG PO TABS
50.0000 mg | ORAL_TABLET | Freq: Every day | ORAL | Status: DC
  Administered 2017-09-01: 50 mg via ORAL
  Filled 2017-08-31: qty 1

## 2017-08-31 MED ORDER — SODIUM CHLORIDE 0.9 % IV BOLUS
500.0000 mL | Freq: Once | INTRAVENOUS | Status: AC
  Administered 2017-08-31: 500 mL via INTRAVENOUS

## 2017-08-31 MED ORDER — PIPERACILLIN-TAZOBACTAM 3.375 G IVPB
3.3750 g | Freq: Three times a day (TID) | INTRAVENOUS | Status: DC
  Administered 2017-09-01 – 2017-09-02 (×5): 3.375 g via INTRAVENOUS
  Filled 2017-08-31 (×5): qty 50

## 2017-08-31 MED ORDER — CLONIDINE HCL 0.1 MG PO TABS
0.1000 mg | ORAL_TABLET | Freq: Every day | ORAL | Status: DC
  Administered 2017-09-01 (×2): 0.1 mg via ORAL
  Filled 2017-08-31 (×2): qty 1

## 2017-08-31 MED ORDER — VANCOMYCIN HCL IN DEXTROSE 1-5 GM/200ML-% IV SOLN
1000.0000 mg | Freq: Once | INTRAVENOUS | Status: AC
  Administered 2017-08-31: 1000 mg via INTRAVENOUS
  Filled 2017-08-31: qty 200

## 2017-08-31 MED ORDER — PANTOPRAZOLE SODIUM 40 MG PO TBEC
40.0000 mg | DELAYED_RELEASE_TABLET | Freq: Every day | ORAL | Status: DC
  Administered 2017-09-01 – 2017-09-06 (×7): 40 mg via ORAL
  Filled 2017-08-31 (×7): qty 1

## 2017-08-31 MED ORDER — IOPAMIDOL (ISOVUE-370) INJECTION 76%
INTRAVENOUS | Status: AC
  Filled 2017-08-31: qty 100

## 2017-08-31 MED ORDER — OXYCODONE HCL 5 MG PO TABS
5.0000 mg | ORAL_TABLET | Freq: Four times a day (QID) | ORAL | Status: DC | PRN
  Administered 2017-09-01 – 2017-09-06 (×15): 5 mg via ORAL
  Filled 2017-08-31 (×15): qty 1

## 2017-08-31 MED ORDER — SODIUM CHLORIDE 0.9 % IV BOLUS: 1000.0000 mL | Freq: Once | INTRAVENOUS | Status: DC

## 2017-08-31 MED ORDER — ACETAMINOPHEN 650 MG RE SUPP: 650.0000 mg | Freq: Four times a day (QID) | RECTAL | Status: DC | PRN

## 2017-08-31 MED ORDER — ENOXAPARIN SODIUM 40 MG/0.4ML ~~LOC~~ SOLN
40.0000 mg | SUBCUTANEOUS | Status: DC
  Administered 2017-09-01 – 2017-09-06 (×6): 40 mg via SUBCUTANEOUS
  Filled 2017-08-31 (×6): qty 0.4

## 2017-08-31 NOTE — ED Triage Notes (Signed)
Patient c/o intermittent shooting chest pain since last night. Also c/o generalized body aches "for weeks" r/t gout and arthritis. Speaking in full sentences without difficulty.

## 2017-08-31 NOTE — Progress Notes (Signed)
A consult was received from an ED physician for [place] per pharmacy dosing.  The patient's profile has been reviewed for ht/wt/allergies/indication/available labs.   A one time order has been placed for Vancomycin 1gm & Zosyn 3.375gm IV.  Further antibiotics/pharmacy consults should be ordered by admitting physician if indicated.                       Thank you, Biagio Borg 08/31/2017  5:21 PM

## 2017-08-31 NOTE — ED Notes (Signed)
Patient transported to CT 

## 2017-08-31 NOTE — H&P (Addendum)
History and Physical    Kathy Bruce FKC:[id number] DOB: 02/11/1966 DOA: 08/31/2017  Referring MD/NP/PA: Dr. Duffy Bruce PCP: Hayden Rasmussen, MD  Patient coming from:home   Chief Complaint: pain  I have personally briefly reviewed patient's old medical records in [place]   HPI: Kathy Bruce is a 52 y.o. female with medical history significant of systolic CHF last EF 74-94%, s/p ICD, CAD, gout, arthritis, and chronic steroid use; who presents with complaints of worsening pain all over over the last 1-2 days.  Complains of having intermittent sharp left-sided chest pain with diffuse joint and muscle pain.  Complains of pain in her bilateral feet, knees, back, hands, and shoulders.  Associated symptoms include complaints nausea, vomiting, fever up to 103 F, malaise, urinary frequency, and intermittently productive cough.  Denies having any loss of consciousness, abdominal pain, blood in stool/urine, dysuria, or known sick contacts.  She reports having a known history of gout and arthritis.  It is unclear whether she was formally diagnosed with rheumatoid arthritis, but states that she was recently told that she had osteoarthritis.  She previously was living in [place] and moved down to [place] the beginning of the year.  Due to her symptoms she reports difficulty in caring for herself at home and reports needing home health.  Last admitted in the hospital and 06/2017 for evaluation for chest pain.   ED Course: Upon admission to the emergency department patient was found to be febrile up to 103.83F, pulse 106-133, respirations 19-28, blood pressure 128/82-149/94, and O2 saturations 97 - 100%.  Labs revealed CBC 15.5, sodium 133, chloride 94, BUN 13, creatinine 1.43, and lactic acid elevated up to 2.36.  Influenza screen was negative. Urinalysis was noted to be abnormal.  CT angiogram of the chest was performed, but did not show any acute signs of a pulmonary embolus or  pneumonia.  Sepsis protocol was initiated with the patient given 1 L of normal saline IV fluids, vancomycin, Zosyn, 100 mg hydrocortisone IV, Tylenol, Zofran, and Dilaudid for pain.   Review of Systems  Constitutional: Positive for fever and malaise/fatigue.  HENT: Negative for ear discharge and nosebleeds.   Eyes: Negative for photophobia and pain.  Respiratory: Positive for cough and shortness of breath. Negative for wheezing.   Cardiovascular: Positive for chest pain and leg swelling (Intermittent).  Gastrointestinal: Positive for nausea and vomiting. Negative for abdominal pain.  Genitourinary: Positive for frequency. Negative for dysuria.  Musculoskeletal: Positive for joint pain and myalgias.  Skin: Negative for itching and rash.  Neurological: Negative for loss of consciousness and weakness.  Psychiatric/Behavioral: Negative for memory loss and suicidal ideas.    Past Medical History:  Diagnosis Date  . Coronary artery disease involving native heart with angina pectoris [facility]) 2014   Patient reports MI in 2013 (in [place]) -- 2nd [place] 2014 in [place], [place] --? stent placement when patient was living in [place]. Need outside records.   . Dilated cardiomyopathy ([place]) 2013   Patient reports this was secondary to MI and she to have AICD placed.   . Hypertensive heart disease with chronic combined systolic and diastolic congestive heart failure ([place]) 2013  . ICD (implantable cardioverter-defibrillator) in place, [facility], 2013 08/29/2013   [facility] in [place], (DR. [place])    Past Surgical History:  Procedure Laterality Date  . [employer] ICD  . TRANSTHORACIC ECHOCARDIOGRAM  06/2017   Mild LV dilation.  Normal thickness.  Mildly reduced EF of 30-35%.  Moderate diffuse HK.  GRII DD.  Moderate-severe MR.  Mild LA dilation      reports that she has never smoked. She has never used smokeless tobacco.  She reports that she drinks alcohol. She reports that she does not use drugs.  No Known Allergies  Family History  Problem Relation Age of Onset  . Hyperthyroidism Mother   . Diabetes Mother   . Tuberculosis Father   . Heart attack Brother   . Hypertension Sister   . Arthritis Sister   . Hyperthyroidism Brother   . Hypertension Brother   . Hypertension Son   . Gout Son   . Diabetes Daughter     Prior to Admission medications   Medication Sig Start Date End Date Taking? Authorizing Provider  aspirin EC 81 MG tablet Take 1 tablet (81 mg total) by mouth daily. 07/05/17  Yes Oswald Hillock, MD  atorvastatin (LIPITOR) 40 MG tablet Take 1 tablet (40 mg total) by mouth daily. 08/20/17 11/18/17 Yes Leonie Man, MD  baclofen (LIORESAL) 10 MG tablet Take 10 mg by mouth 3 (three) times daily.   Yes [provider]  carvedilol (COREG) 12.5 MG tablet Take 1 tablet (12.5 mg total) by mouth 2 (two) times daily with a meal. 07/05/17  Yes Darrick Meigs, Marge Duncans, MD  cloNIDine (CATAPRES) 0.1 MG tablet Take 1 tablet (0.1 mg total) by mouth daily. 07/05/17  Yes Oswald Hillock, MD  colchicine 0.6 MG tablet Take 1 tablet (0.6 mg total) by mouth 2 (two) times daily. 07/05/17  Yes Oswald Hillock, MD  furosemide (LASIX) 40 MG tablet Take 1 tablet (40 mg total) by mouth daily. 08/20/17 11/18/17 Yes Leonie Man, MD  isosorbide mononitrate (IMDUR) 30 MG 24 hr tablet Take 1 tablet (30 mg total) by mouth daily. 08/20/17 11/18/17 Yes Leonie Man, MD  losartan (COZAAR) 50 MG tablet Take 1 tablet (50 mg total) by mouth daily. 08/20/17 11/18/17 Yes Leonie Man, MD  nitroGLYCERIN (NITROSTAT) 0.4 MG SL tablet Place 0.4 mg under the tongue every 5 (five) minutes as needed for chest pain.   Yes [provider]  omeprazole (PRILOSEC) 40 MG capsule Take 1 capsule (40 mg total) by mouth daily. 07/05/17  Yes Oswald Hillock, MD  oxyCODONE-acetaminophen (PERCOCET) 10-325 MG tablet Take 1 tablet by mouth every 6  (six) hours as needed for pain. 07/05/17  Yes Oswald Hillock, MD  predniSONE (DELTASONE) 10 MG tablet Take 1 tablet (10 mg total) by mouth daily with breakfast. 07/05/17  Yes Darrick Meigs, Marge Duncans, MD  tetrahydrozoline (VISINE) 0.05 % ophthalmic solution Place 2 drops into both eyes daily as needed (DRY EYES). 07/05/17  Yes Oswald Hillock, MD  zolpidem (AMBIEN) 10 MG tablet Take 10 mg by mouth at bedtime.  08/28/17  Yes [provider]  zolpidem (AMBIEN) 5 MG tablet Take 1 tablet (5 mg total) by mouth at bedtime. Patient not taking: Reported on 08/31/2017 07/05/17   Oswald Hillock, MD    Physical Exam:  Constitutional: Female who appears sick, but nontoxic and able to follow commands. Vitals:   08/31/17 1700 08/31/17 1708 08/31/17 1946 08/31/17 1947  BP: (!) 140/97  128/82 128/82  Pulse:   (!) 106 (!) 111  Resp: 19  19 (!) 21  Temp:  (!) 103.4 F (39.7 C)  99.8 F (37.7 C)  TempSrc:  Rectal  Oral  SpO2:   97% 98%  Weight:      Height:       Eyes: PERRL, lids and conjunctivae normal ENMT: Mucous membranes are moist. Posterior pharynx clear of any exudate or lesions.Normal dentition.  Neck: normal, supple, no masses, no thyromegaly Respiratory: Mildly tachypneic.  Clear to auscultation bilaterally, no wheezing, no crackles. No accessory muscle use.   Cardiovascular: Tachycardic, no murmurs / rubs / gallops. 2+ pedal pulses. No carotid bruits.  Abdomen: no tenderness, no masses palpated. No hepatosplenomegaly. Bowel sounds positive.  Musculoskeletal: no clubbing / cyanosis.  Some mild swelling hands, feet, and knee joints. Skin: no rashes, lesions, ulcers. No induration Neurologic: CN 2-12 grossly intact. Sensation intact, DTR normal. Strength 5/5 in all 4.  Psychiatric: Normal judgment and insight. Alert and oriented x 3. Normal mood.     Labs on Admission: I have personally reviewed following labs and imaging studies  CBC: Recent Labs  Lab 08/31/17 1733  WBC 15.5*  NEUTROABS 11.5*    HGB 12.6  HCT 37.4  MCV 87.8  PLT 097   Basic Metabolic Panel: Recent Labs  Lab 08/31/17 1733  NA 133*  K 3.6  CL 94*  CO2 25  GLUCOSE 138*  BUN 13  CREATININE 1.43*  CALCIUM 10.1   GFR: Estimated Creatinine Clearance: 46.4 mL/min (A) (by C-G formula based on SCr of 1.43 mg/dL (H)). Liver Function Tests: Recent Labs  Lab 08/31/17 1733  AST 93*  ALT 72*  ALKPHOS 170*  BILITOT 2.7*  PROT 9.6*  ALBUMIN 3.8   No results for input(s): LIPASE, AMYLASE in the last 168 hours. No results for input(s): AMMONIA in the last 168 hours. Coagulation Profile: No results for input(s): INR, PROTIME in the last 168 hours. Cardiac Enzymes: Recent Labs  Lab 08/31/17 1733  TROPONINI 0.03*   BNP (last 3 results) No results for input(s): PROBNP in the last 8760 hours. HbA1C: No results for input(s): HGBA1C in the last 72 hours. CBG: No results for input(s): GLUCAP in the last 168 hours. Lipid Profile: No results for input(s): CHOL, HDL, LDLCALC, TRIG, CHOLHDL, LDLDIRECT in the last 72 hours. Thyroid Function Tests: No results for input(s): TSH, T4TOTAL, FREET4, T3FREE, THYROIDAB in the last 72 hours. Anemia Panel: No results for input(s): VITAMINB12, FOLATE, FERRITIN, TIBC, IRON, RETICCTPCT in the last 72 hours. Urine analysis:    Component Value Date/Time   COLORURINE AMBER (A) 08/31/2017 1918   APPEARANCEUR HAZY (A) 08/31/2017 1918   LABSPEC 1.024 08/31/2017 1918   PHURINE 5.0 08/31/2017 1918   GLUCOSEU NEGATIVE 08/31/2017 1918   HGBUR SMALL (A) 08/31/2017 1918   BILIRUBINUR SMALL (A) 08/31/2017 1918   KETONESUR NEGATIVE 08/31/2017 1918   PROTEINUR >=300 (A) 08/31/2017 1918   NITRITE NEGATIVE 08/31/2017 1918   LEUKOCYTESUR SMALL (A) 08/31/2017 1918   Sepsis Labs: No results found for this or any previous visit (from the past 240 hour(s)).   Radiological Exams on Admission: Dg Chest 2 View  Result Date: 08/31/2017 CLINICAL DATA:  Chest pain EXAM: CHEST - 2 VIEW  COMPARISON:  July 02, 2017 FINDINGS: The subcutaneous implantable defibrillator is again noted with lead position unchanged. Heart is borderline enlarged with pulmonary vascularity within normal limits. No edema or consolidation. No pneumothorax. No bone lesions. IMPRESSION: Stable cardiac prominence. No edema or consolidation. Stable positioning of subcutaneous implantable defibrillator lead. Electronically Signed   By: Lowella Grip III M.D.   On: 08/31/2017 17:30   Ct Angio Chest Pe W And/or Wo Contrast  Result Date: 08/31/2017 CLINICAL DATA:  52 year old female with intermittent chest pain since last night. EXAM: CT ANGIOGRAPHY CHEST WITH CONTRAST TECHNIQUE: Multidetector CT imaging of the chest was performed using the standard protocol during bolus administration of intravenous contrast. Multiplanar CT image reconstructions and MIPs were obtained to evaluate the vascular anatomy. CONTRAST:  32m ISOVUE-370 IOPAMIDOL (ISOVUE-370) INJECTION 76% COMPARISON:  CTA chest abdomen and pelvis 07/02/2017 FINDINGS: Cardiovascular: Good contrast bolus timing in the pulmonary arterial tree. Mild respiratory motion, moderate in both lower lobes. Subsequently, the distal bilateral lower lobe branches are not well evaluated. But otherwise there is no focal filling defect identified in the pulmonary arteries to suggest acute pulmonary embolism. Stable mild cardiomegaly. No pericardial effusion. Negative thoracic and upper abdominal aorta aside from mild soft atherosclerosis. No definite calcified coronary artery atherosclerosis. Mediastinum/Nodes: Stable and negative. Lungs/Pleura: Lower lung volumes. Atelectatic changes to the hilar airways, greater on the left. The central airways are patent. Mild dependent atelectasis in both lungs. No pleural effusion or other abnormal pulmonary opacity. Upper Abdomen: The visible upper abdomen is stable and negative. Musculoskeletal: Small intramuscular lipoma in the right  posterior shoulder musculature incidentally noted and benign (series 4, image 11). Left axillary or lateral chest wall AICD generator device with a single subcutaneous left lateral and central chest wall lead is stable. No acute osseous abnormality identified. Review of the MIP images confirms the above findings. IMPRESSION: 1. No acute pulmonary embolus identified. The distal bilateral lower lobe branches are obscured by motion. 2. Lower lung volumes with atelectasis. No other acute pulmonary process. 3. Stable cardiomegaly.  No pericardial effusion. Electronically Signed   By: HGenevie AnnM.D.   On: 08/31/2017 20:38    EKG: Independently reviewed.  Sinus tachycardia at 128 bpm  Assessment/Plan SIRS/sepsis, unknown cause: Acute.  Patient presents febrile up to 103.4 F with tachycardia and tachypnea.  Labs revealed WBC 15.5 and lactic acid elevated to 2.36.  Sepsis protocol has been initiated with reduce fluid bolus of total of 1 L of normal saline with empiric antibiotics.  Urinalysis was noted to be abnormal as a possible sign of infection. - Admit to stepdown bed - Follow-up blood and urine cultures - Add on respiratory virus panel - Continue empiric antibiotics of vancomycin and Zosyn - Tylenol prn fever  - Trend lactic acid level  Chest pain, elevated troponin: Acute.  Troponin elevated at 0.03.  EKG showing sinus tachycardia without significant ST elevation noted. - Trend troponin - May warrant consult to cardiology in a.m.  Gout, history of arthritis: Chronic.  Patient on chronic steroids of 10 mg prednisone daily.  Given 100 mg of hydrocortisone in the ED.  Patient reports difficulty in completing ADLs and asked for assistance in form of home health.  Patient notes history of gout and unclear if patient has rheumatoid or osteoarthritis.  May need to get records from previous primary care provider. - added on CRP, ESR, uric acid level - Prednisone 60 mg p.o. if significant signs of gout  flare - Continue oxycodone prn pain - OT to eval and treat for difficulty completing ADLs - Care management consult for need of home health  Renal insufficiency with history of chronic kidney disease: Patient presents with a creatinine of 1.43 Baseline creatinine noted to be 1.19-1.32.  - Normal saline IV fluids 75 ml/hr for additional liter - Recheck kidney function in a.m.   Elevated liver enzymes: Patient found to have AST 170, AST 93, ALT 72 denies any significant history of alcohol use. - Recheck  CMP in a.m.   CAD  - Continue aspirin and isosorbide mononitrate  Systolic CHF: Appears stable.  Patient does not appear to be acutely overloaded at this time. - Strict I&O - Daily weights  Essential hypertension - Continue Coreg, clonidine, furosemide, losartan  S/p ICD   DVT prophylaxis: lovenox  Code Status: Full  Family Communication: no family present Disposition Plan: TBD  Consults called: none  Admission status: Inpatient   Norval Morton MD [facility] Pager [phone]   If 7PM-7AM, please contact night-coverage [email or web address] Password [facility]  08/31/2017, 9:18 PM

## 2017-08-31 NOTE — ED Provider Notes (Signed)
[facility] DEPT Provider Note   CSN: [id number] Arrival date & time: 08/31/17  1453     History   Chief Complaint Chief Complaint  Patient presents with  . Chest Pain  . Generalized Body Aches    HPI Kathy Bruce is a 52 y.o. female.  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  The patient states her symptoms started yesterday as an intermittent sharp chest pain with associated diffuse body aches and arthralgias.  She had subjective fevers as well.  Since then, she had a mild, occasionally productive cough, a pleuritic chest pain is worse with movement and palpation, as well as a an upper back pain.  She also notes pain in her right wrist and bilateral ankles.  She has a reported history of rheumatoid arthritis per her report, but is not currently on any medications for this.  She denies any swelling in her legs.  Denies any known sick contacts.  Denies any abdominal pain or vomiting.  Denies any known specific sick contacts.  She feels short of breath at rest and with exertion.  This is all new over the last 2 days and began fairly acutely.  Denies any recent trauma.  Denies any significant unilateral joint swelling or warmth.  Past Medical History:  Diagnosis Date  . Coronary artery disease involving native heart with angina pectoris [facility]) 2014   Patient reports MI in 2013 (in [place]) -- 2nd [place] 2014 in [place], [place] --? stent placement when patient was living in [place]. Need outside records.   . Dilated cardiomyopathy ([place]) 2013   Patient reports this was secondary to MI and she to have AICD placed.   . Hypertensive heart disease with chronic combined systolic and diastolic congestive heart failure ([place]) 2013  . ICD (implantable cardioverter-defibrillator) in place, [facility], 2013 08/29/2013   [facility] in [place], (DR. Suann Larry [facility])    Patient Active Problem List   Diagnosis Date Noted  . Sepsis ([place]) 08/31/2017  . Hyperlipidemia due to dietary fat intake 08/20/2017  . Hypertensive urgency 07/02/2017  . Chest pain with moderate risk for cardiac etiology 07/02/2017  . ICD (implantable cardioverter-defibrillator) in place, [facility], 2013 07/02/2017  . UTI (urinary tract infection) 07/02/2017  . Chest pain 07/02/2017  . Coronary artery disease involving native heart with angina pectoris ([place]) 06/09/2012  . Hypertensive heart disease with chronic combined systolic and diastolic congestive heart failure ([place]) 06/10/2011  . Dilated cardiomyopathy ([place]) 06/10/2011    Past Surgical History:  Procedure Laterality Date  . [place] [employer] ICD  . TRANSTHORACIC ECHOCARDIOGRAM  06/2017   Mild LV dilation.  Normal thickness.  Mildly reduced EF of 30-35%.  Moderate diffuse HK.  GRII DD.  Moderate-severe MR.  Mild LA dilation      OB History   None      Home Medications    Prior to Admission medications   Medication Sig Start Date End Date Taking? Authorizing Provider  aspirin EC 81 MG tablet Take 1 tablet (81 mg total) by mouth daily. 07/05/17  Yes Oswald Hillock, MD  atorvastatin (LIPITOR) 40 MG tablet Take 1 tablet (40 mg total) by mouth daily. 08/20/17 11/18/17 Yes Leonie Man, MD  baclofen (LIORESAL) 10 MG tablet Take 10 mg by mouth 3 (three) times daily.   Yes [provider]  carvedilol (  COREG) 12.5 MG tablet Take 1 tablet (12.5 mg total) by mouth 2 (two) times daily with a meal. 07/05/17  Yes Darrick Meigs, Marge Duncans, MD  cloNIDine (CATAPRES) 0.1 MG tablet Take 1 tablet (0.1 mg total) by mouth daily. 07/05/17  Yes Oswald Hillock, MD  colchicine 0.6 MG tablet Take 1 tablet (0.6 mg total) by mouth 2 (two) times daily. 07/05/17  Yes Oswald Hillock, MD  furosemide (LASIX) 40 MG tablet Take 1 tablet (40 mg total) by mouth daily. 08/20/17 11/18/17 Yes Leonie Man, MD  isosorbide mononitrate (IMDUR) 30 MG 24 hr tablet Take 1 tablet (30 mg total) by mouth daily. 08/20/17 11/18/17 Yes Leonie Man, MD  losartan (COZAAR) 50 MG tablet Take 1 tablet (50 mg total) by mouth daily. 08/20/17 11/18/17 Yes Leonie Man, MD  nitroGLYCERIN (NITROSTAT) 0.4 MG SL tablet Place 0.4 mg under the tongue every 5 (five) minutes as needed for chest pain.   Yes [provider]  omeprazole (PRILOSEC) 40 MG capsule Take 1 capsule (40 mg total) by mouth daily. 07/05/17  Yes Oswald Hillock, MD  oxyCODONE-acetaminophen (PERCOCET) 10-325 MG tablet Take 1 tablet by mouth every 6 (six) hours as needed for pain. 07/05/17  Yes Oswald Hillock, MD  predniSONE (DELTASONE) 10 MG tablet Take 1 tablet (10 mg total) by mouth daily with breakfast. 07/05/17  Yes Darrick Meigs, Marge Duncans, MD  tetrahydrozoline (VISINE) 0.05 % ophthalmic solution Place 2 drops into both eyes daily as needed (DRY EYES). 07/05/17  Yes Oswald Hillock, MD  zolpidem (AMBIEN) 10 MG tablet Take 10 mg by mouth at bedtime.  08/28/17  Yes [provider]  zolpidem (AMBIEN) 5 MG tablet Take 1 tablet (5 mg total) by mouth at bedtime. Patient not taking: Reported on 08/31/2017 07/05/17   Oswald Hillock, MD    Family History Family History  Problem Relation Age of Onset  . Hyperthyroidism Mother   . Diabetes Mother   . Tuberculosis Father   . Heart attack Brother   . Hypertension Sister   . Arthritis Sister   . Hyperthyroidism Brother   . Hypertension Brother   . Hypertension Son   . Gout Son   . Diabetes Daughter     Social History Social History   Tobacco Use  . Smoking status: Never Smoker  . Smokeless tobacco: Never Used  Substance Use Topics  . Alcohol use: Yes    Frequency: Never    Comment: Occasionally  . Drug use: No     Allergies   Patient has no known allergies.   Review of Systems Review of Systems  Constitutional: Positive for fatigue.  Respiratory: Positive for cough, chest  tightness and shortness of breath.   Cardiovascular: Positive for chest pain.  Gastrointestinal: Positive for nausea.  Neurological: Positive for weakness.  All other systems reviewed and are negative.    Physical Exam Updated Vital Signs BP 120/80   Pulse 94   Temp 99.8 F (37.7 C) (Oral)   Resp 19   Ht 5\' 2"  (1.575 m)   Wt 82.6 kg (182 lb)   SpO2 98%   BMI 33.29 kg/m   Physical Exam  Constitutional: She is oriented to person, place, and time. She appears well-developed and well-nourished. She appears ill. No distress.  HENT:  Head: Normocephalic and atraumatic.  Eyes: Conjunctivae are normal.  Neck: Neck supple.  Cardiovascular: Regular rhythm and normal heart sounds. Tachycardia present. Exam reveals no friction rub.  No  murmur heard. Pulmonary/Chest: Effort normal. Tachypnea noted. No respiratory distress. She has decreased breath sounds. She has no wheezes. She has no rales.  Abdominal: She exhibits no distension.  Musculoskeletal: She exhibits no edema.  Neurological: She is alert and oriented to person, place, and time. She exhibits normal muscle tone.  Skin: Skin is warm. Capillary refill takes less than 2 seconds.  Psychiatric: She has a normal mood and affect.  Nursing note and vitals reviewed.    ED Treatments / Results  Labs (all labs ordered are listed, but only abnormal results are displayed) Labs Reviewed  CBC WITH DIFFERENTIAL/PLATELET - Abnormal; Notable for the following components:      Result Value   WBC 15.5 (*)    Neutro Abs 11.5 (*)    Monocytes Absolute 2.2 (*)    All other components within normal limits  COMPREHENSIVE METABOLIC PANEL - Abnormal; Notable for the following components:   Sodium 133 (*)    Chloride 94 (*)    Glucose, Bld 138 (*)    Creatinine, Ser 1.43 (*)    Total Protein 9.6 (*)    AST 93 (*)    ALT 72 (*)    Alkaline Phosphatase 170 (*)    Total Bilirubin 2.7 (*)    GFR calc non Af Amer 42 (*)    GFR calc Af Amer 48  (*)    All other components within normal limits  TROPONIN I - Abnormal; Notable for the following components:   Troponin I 0.03 (*)    All other components within normal limits  URINALYSIS, ROUTINE W REFLEX MICROSCOPIC - Abnormal; Notable for the following components:   Color, Urine AMBER (*)    APPearance HAZY (*)    Hgb urine dipstick SMALL (*)    Bilirubin Urine SMALL (*)    Protein, ur >=300 (*)    Leukocytes, UA SMALL (*)    Bacteria, UA RARE (*)    Squamous Epithelial / LPF 6-30 (*)    All other components within normal limits  I-STAT BETA HCG BLOOD, ED (MC, WL, AP ONLY) - Abnormal; Notable for the following components:   I-stat hCG, quantitative 28.9 (*)    All other components within normal limits  I-STAT CG4 LACTIC ACID, ED - Abnormal; Notable for the following components:   Lactic Acid, Venous 2.12 (*)    All other components within normal limits  I-STAT CG4 LACTIC ACID, ED - Abnormal; Notable for the following components:   Lactic Acid, Venous 2.36 (*)    All other components within normal limits  CULTURE, BLOOD (ROUTINE X 2)  CULTURE, BLOOD (ROUTINE X 2)  URINE CULTURE  RESPIRATORY PANEL BY PCR  INFLUENZA PANEL BY PCR (TYPE A & B)  HCG, QUANTITATIVE, PREGNANCY  PROCALCITONIN  CBC  COMPREHENSIVE METABOLIC PANEL  TROPONIN I  TROPONIN I  TROPONIN I  URIC ACID  C-REACTIVE PROTEIN  SEDIMENTATION RATE  I-STAT TROPONIN, ED  I-STAT CG4 LACTIC ACID, ED    EKG EKG Interpretation  Date/Time:  Monday August 31 2017 15:33:42 EDT Ventricular Rate:  134 PR Interval:    QRS Duration: 98 QT Interval:  274 QTC Calculation: 409 R Axis:   12 Text Interpretation:  Sinus tachycardia Probable LVH with secondary repol abnrm Baseline wander in lead(s) V1 Since last EKG, there are new inferolateral ST-t changes, possibly rate related Confirmed by Duffy Bruce [phone]) on 08/31/2017 4:54:41 PM   Radiology Dg Chest 2 View  Result Date: 08/31/2017 CLINICAL DATA:  Chest  pain  EXAM: CHEST - 2 VIEW COMPARISON:  July 02, 2017 FINDINGS: The subcutaneous implantable defibrillator is again noted with lead position unchanged. Heart is borderline enlarged with pulmonary vascularity within normal limits. No edema or consolidation. No pneumothorax. No bone lesions. IMPRESSION: Stable cardiac prominence. No edema or consolidation. Stable positioning of subcutaneous implantable defibrillator lead. Electronically Signed   By: Lowella Grip III M.D.   On: 08/31/2017 17:30   Ct Angio Chest Pe W And/or Wo Contrast  Result Date: 08/31/2017 CLINICAL DATA:  52 year old female with intermittent chest pain since last night. EXAM: CT ANGIOGRAPHY CHEST WITH CONTRAST TECHNIQUE: Multidetector CT imaging of the chest was performed using the standard protocol during bolus administration of intravenous contrast. Multiplanar CT image reconstructions and MIPs were obtained to evaluate the vascular anatomy. CONTRAST:  65mL ISOVUE-370 IOPAMIDOL (ISOVUE-370) INJECTION 76% COMPARISON:  CTA chest abdomen and pelvis 07/02/2017 FINDINGS: Cardiovascular: Good contrast bolus timing in the pulmonary arterial tree. Mild respiratory motion, moderate in both lower lobes. Subsequently, the distal bilateral lower lobe branches are not well evaluated. But otherwise there is no focal filling defect identified in the pulmonary arteries to suggest acute pulmonary embolism. Stable mild cardiomegaly. No pericardial effusion. Negative thoracic and upper abdominal aorta aside from mild soft atherosclerosis. No definite calcified coronary artery atherosclerosis. Mediastinum/Nodes: Stable and negative. Lungs/Pleura: Lower lung volumes. Atelectatic changes to the hilar airways, greater on the left. The central airways are patent. Mild dependent atelectasis in both lungs. No pleural effusion or other abnormal pulmonary opacity. Upper Abdomen: The visible upper abdomen is stable and negative. Musculoskeletal: Small intramuscular  lipoma in the right posterior shoulder musculature incidentally noted and benign (series 4, image 11). Left axillary or lateral chest wall AICD generator device with a single subcutaneous left lateral and central chest wall lead is stable. No acute osseous abnormality identified. Review of the MIP images confirms the above findings. IMPRESSION: 1. No acute pulmonary embolus identified. The distal bilateral lower lobe branches are obscured by motion. 2. Lower lung volumes with atelectasis. No other acute pulmonary process. 3. Stable cardiomegaly.  No pericardial effusion. Electronically Signed   By: Genevie Ann M.D.   On: 08/31/2017 20:38    Procedures .Critical Care Performed by: Duffy Bruce, MD Authorized by: Duffy Bruce, MD   Critical care provider statement:    Critical care time (minutes):  35   Critical care time was exclusive of:  Separately billable procedures and treating other patients and teaching time   Critical care was necessary to treat or prevent imminent or life-threatening deterioration of the following conditions:  Dehydration, respiratory failure, circulatory failure, cardiac failure and sepsis   Critical care was time spent personally by me on the following activities:  Development of treatment plan with patient or surrogate, discussions with consultants, evaluation of patient's response to treatment, examination of patient, obtaining history from patient or surrogate, ordering and performing treatments and interventions, ordering and review of laboratory studies, ordering and review of radiographic studies, pulse oximetry, re-evaluation of patient's condition and review of old charts   I assumed direction of critical care for this patient from another provider in my specialty: no     (including critical care time)  Medications Ordered in ED Medications  iopamidol (ISOVUE-370) 76 % injection (has no administration in time range)  carvedilol (COREG) tablet 12.5 mg (has no  administration in time range)  cloNIDine (CATAPRES) tablet 0.1 mg (has no administration in time range)  furosemide (LASIX) tablet 40 mg (has no  administration in time range)  isosorbide mononitrate (IMDUR) 24 hr tablet 30 mg (has no administration in time range)  losartan (COZAAR) tablet 50 mg (has no administration in time range)  colchicine tablet 0.6 mg (has no administration in time range)  pantoprazole (PROTONIX) EC tablet 40 mg (has no administration in time range)  predniSONE (DELTASONE) tablet 10 mg (has no administration in time range)  polyvinyl alcohol (LIQUIFILM TEARS) 1.4 % ophthalmic solution 2 drop (has no administration in time range)  zolpidem (AMBIEN) tablet 5 mg (has no administration in time range)  nitroGLYCERIN (NITROSTAT) SL tablet 0.4 mg (has no administration in time range)  enoxaparin (LOVENOX) injection 40 mg (has no administration in time range)  acetaminophen (TYLENOL) tablet 650 mg (has no administration in time range)    Or  acetaminophen (TYLENOL) suppository 650 mg (has no administration in time range)  ondansetron (ZOFRAN) tablet 4 mg (has no administration in time range)    Or  ondansetron (ZOFRAN) injection 4 mg (has no administration in time range)  albuterol (PROVENTIL) (2.5 MG/3ML) 0.083% nebulizer solution 2.5 mg (has no administration in time range)  piperacillin-tazobactam (ZOSYN) IVPB 3.375 g (has no administration in time range)  vancomycin (VANCOCIN) IVPB 1000 mg/200 mL premix (has no administration in time range)  0.9 %  sodium chloride infusion (has no administration in time range)  HYDROmorphone (DILAUDID) injection 1 mg (has no administration in time range)  oxyCODONE-acetaminophen (PERCOCET/ROXICET) 5-325 MG per tablet 1 tablet (has no administration in time range)    And  oxyCODONE (Oxy IR/ROXICODONE) immediate release tablet 5 mg (has no administration in time range)  HYDROmorphone (DILAUDID) injection 1 mg (1 mg Intravenous Given 08/31/17  1742)  ondansetron (ZOFRAN) injection 4 mg (4 mg Intravenous Given 08/31/17 1742)  sodium chloride 0.9 % bolus 500 mL (500 mLs Intravenous Given 08/31/17 1742)  acetaminophen (TYLENOL) tablet 1,000 mg (1,000 mg Oral Given 08/31/17 1743)  hydrocortisone sodium succinate (SOLU-CORTEF) 100 MG injection 100 mg (100 mg Intravenous Given 08/31/17 1743)  piperacillin-tazobactam (ZOSYN) IVPB 3.375 g (0 g Intravenous Stopped 08/31/17 1845)  vancomycin (VANCOCIN) IVPB 1000 mg/200 mL premix (0 mg Intravenous Stopped 08/31/17 2018)  sodium chloride 0.9 % bolus 500 mL (500 mLs Intravenous Given 08/31/17 1914)  HYDROmorphone (DILAUDID) injection 1 mg (1 mg Intravenous Given 08/31/17 2020)  iopamidol (ISOVUE-370) 76 % injection 100 mL (75 mLs Intravenous Contrast Given 08/31/17 2021)     Initial Impression / Assessment and Plan / ED Course  I have reviewed the triage vital signs and the nursing notes.  Pertinent labs & imaging results that were available during my care of the patient were reviewed by me and considered in my medical decision making (see chart for details).     52 yo f with h/o chronic arthritis, CAD with DCM, here with fever, tachycardia, chest pain, nausea, fatigue. Exam as above. On arrival, tp febrile, tachycardic, tachypneic. Labs show leukocytosis, lactic acidosis. CODE SEPSIS initiated, pt started on broad-spectrum ABX. Given severe DCM 2/2 CAD with h/o recurrent CHF, hold on 30 cc/kg in absence of shock/severe sepsis.  LA clearing with 1L fluid. Continue cautious fluid resuscitation. HR improving as well. Sepsis re-eval completed and pt improving. UA c/w UTI - unclear if thi sis primary source of infection, but broad-spectrum ABX have been given. Stress-dose steroids also given, as I suspect there is a component of steroid dependency and possible RA/arthritis exacerbation. Admit to medicine.  Final Clinical Impressions(s) / ED Diagnoses   Final diagnoses:  Sepsis, due to unspecified  organism [facility])  Lactic acidosis    ED Discharge Orders    None       Duffy Bruce, MD 09/01/17 0001

## 2017-08-31 NOTE — ED Notes (Addendum)
Date and time results received: 08/31/17 18:40 (use smartphrase ".now" to insert current time)  Test: Troponin  Critical Value: 0.03ng/mL  Name of Provider Notified: Dr. Ellender Hose   Orders Received? Or Actions Taken?: Will continue to monitor patient and awaiting orders.    Primary nurse: Ubaldo Glassing RN is aware of the results.

## 2017-08-31 NOTE — Progress Notes (Signed)
Pharmacy Antibiotic Note  Joaquina Nissen is a 52 y.o. female admitted on 08/31/2017 with sepsis.  Pharmacy has been consulted for Vancomycin and Zosyn dosing.  Plan: Zosyn 3.375g IV q8h (4 hour infusion).   Vancomycin 1gm iv x1, then 1gm iv q36hr   Goal AUC = 400 - 500 for all indications, except meningitis (goal AUC > 500 and Cmin 15-20 mcg/mL)   Height: 5\' 2"  (157.5 cm) Weight: 182 lb (82.6 kg) IBW/kg (Calculated) : 50.1  Temp (24hrs), Avg:101 F (38.3 C), Min:99.7 F (37.6 C), Max:103.4 F (39.7 C)  Recent Labs  Lab 08/31/17 1733 08/31/17 1735 08/31/17 1747 08/31/17 1901  WBC 15.5*  --   --   --   CREATININE 1.43*  --   --   --   LATICACIDVEN  --  2.12* 2.36* 1.56    Estimated Creatinine Clearance: 46.4 mL/min (A) (by C-G formula based on SCr of 1.43 mg/dL (H)).    No Known Allergies  Antimicrobials this admission: Vancomycin 08/31/2017 >> Zosyn 08/31/2017 >>   Dose adjustments this admission: -  Microbiology results: -  Thank you for allowing pharmacy to be a part of this patient's care.  Nani Skillern Crowford 08/31/2017 10:29 PM

## 2017-09-01 ENCOUNTER — Encounter: Payer: Medicaid Other | Admitting: Gastroenterology

## 2017-09-01 DIAGNOSIS — N289 Disorder of kidney and ureter, unspecified: Secondary | ICD-10-CM

## 2017-09-01 DIAGNOSIS — I10 Essential (primary) hypertension: Secondary | ICD-10-CM | POA: Diagnosis present

## 2017-09-01 DIAGNOSIS — N390 Urinary tract infection, site not specified: Secondary | ICD-10-CM

## 2017-09-01 DIAGNOSIS — M109 Gout, unspecified: Secondary | ICD-10-CM | POA: Diagnosis present

## 2017-09-01 LAB — RESPIRATORY PANEL BY PCR
Adenovirus: NOT DETECTED
Bordetella pertussis: NOT DETECTED
CORONAVIRUS 229E-RVPPCR: NOT DETECTED
CORONAVIRUS HKU1-RVPPCR: NOT DETECTED
CORONAVIRUS OC43-RVPPCR: NOT DETECTED
Chlamydophila pneumoniae: NOT DETECTED
Coronavirus NL63: NOT DETECTED
INFLUENZA B-RVPPCR: NOT DETECTED
Influenza A: NOT DETECTED
METAPNEUMOVIRUS-RVPPCR: NOT DETECTED
Mycoplasma pneumoniae: NOT DETECTED
PARAINFLUENZA VIRUS 1-RVPPCR: NOT DETECTED
PARAINFLUENZA VIRUS 2-RVPPCR: NOT DETECTED
Parainfluenza Virus 3: NOT DETECTED
Parainfluenza Virus 4: NOT DETECTED
RESPIRATORY SYNCYTIAL VIRUS-RVPPCR: NOT DETECTED
Rhinovirus / Enterovirus: NOT DETECTED

## 2017-09-01 LAB — CBC
HEMATOCRIT: 30.1 % — AB (ref 36.0–46.0)
HEMOGLOBIN: 10.1 g/dL — AB (ref 12.0–15.0)
MCH: 29.7 pg (ref 26.0–34.0)
MCHC: 33.6 g/dL (ref 30.0–36.0)
MCV: 88.5 fL (ref 78.0–100.0)
Platelets: 276 10*3/uL (ref 150–400)
RBC: 3.4 MIL/uL — ABNORMAL LOW (ref 3.87–5.11)
RDW: 14.2 % (ref 11.5–15.5)
WBC: 12.8 10*3/uL — ABNORMAL HIGH (ref 4.0–10.5)

## 2017-09-01 LAB — COMPREHENSIVE METABOLIC PANEL
ALBUMIN: 2.8 g/dL — AB (ref 3.5–5.0)
ALT: 47 U/L (ref 14–54)
ANION GAP: 11 (ref 5–15)
AST: 44 U/L — AB (ref 15–41)
Alkaline Phosphatase: 123 U/L (ref 38–126)
BILIRUBIN TOTAL: 1.6 mg/dL — AB (ref 0.3–1.2)
BUN: 14 mg/dL (ref 6–20)
CO2: 24 mmol/L (ref 22–32)
Calcium: 9 mg/dL (ref 8.9–10.3)
Chloride: 102 mmol/L (ref 101–111)
Creatinine, Ser: 1.36 mg/dL — ABNORMAL HIGH (ref 0.44–1.00)
GFR calc Af Amer: 51 mL/min — ABNORMAL LOW (ref >60)
GFR calc non Af Amer: 44 mL/min — ABNORMAL LOW (ref >60)
GLUCOSE: 153 mg/dL — AB (ref 65–99)
Potassium: 3.8 mmol/L (ref 3.5–5.1)
Sodium: 137 mmol/L (ref 135–145)
TOTAL PROTEIN: 7.4 g/dL (ref 6.5–8.1)

## 2017-09-01 LAB — PROCALCITONIN: Procalcitonin: 0.85 ng/mL

## 2017-09-01 LAB — TROPONIN I
Troponin I: 0.03 ng/mL (ref <0.03)
Troponin I: <0.03 ng/mL (ref <0.03)
Troponin I: <0.03 ng/mL (ref <0.03)

## 2017-09-01 LAB — C-REACTIVE PROTEIN: CRP: 23.5 mg/dL — AB (ref <1.0)

## 2017-09-01 LAB — SEDIMENTATION RATE: SED RATE: 120 mm/h — AB (ref 0–22)

## 2017-09-01 LAB — URIC ACID: URIC ACID, SERUM: 7.1 mg/dL — AB (ref 2.3–6.6)

## 2017-09-01 MED ORDER — SODIUM CHLORIDE 0.9 % IV SOLN
INTRAVENOUS | Status: DC
  Administered 2017-09-02 – 2017-09-03 (×2): via INTRAVENOUS

## 2017-09-01 MED ORDER — PREDNISONE 10 MG PO TABS: 10.0000 mg | ORAL_TABLET | Freq: Every day | ORAL | Status: DC

## 2017-09-01 MED ORDER — PREDNISONE 10 MG PO TABS
60.0000 mg | ORAL_TABLET | Freq: Every day | ORAL | Status: DC
  Administered 2017-09-01: 10:00:00 60 mg via ORAL
  Filled 2017-09-01: qty 1

## 2017-09-01 MED ORDER — PREDNISONE 20 MG PO TABS: 40.0000 mg | ORAL_TABLET | Freq: Every day | ORAL | Status: DC

## 2017-09-01 NOTE — ED Notes (Signed)
Pt switched from ER stretcher to hospital bed.

## 2017-09-01 NOTE — Progress Notes (Addendum)
PROGRESS NOTE    Kathy Bruce  MVE:[id number] DOB: [birth year] DOA: 08/31/2017 PCP: Hayden Rasmussen, MD   Brief Narrative: Patient is a 52 year old female with past medical history significant for systolic CHF, last ejection fraction 30-35%, status post ICD, coronary artery disease, gout, arthritis on chronic steroid use who presents to the emergency department with complaints of generalized body ache and generalized weakness.  Patient was found to be febrile up to 103 F.  Patient was also having urinary frequency.  Patient's UA was suggestive of urinary tract infection and she was admitted for further management .  Assessment & Plan:   Principal Problem:   SIRS (systemic inflammatory response syndrome) (HCC) Active Problems:   ICD (implantable cardioverter-defibrillator) in place, [facility], 2013   Chest pain   Coronary artery disease involving native heart with angina pectoris (HCC)   Gout   Renal insufficiency   HTN (hypertension)  SIRS/possible sepsis: Was found to be febrile up to 103 Fahrenheit with tachycardia and tachypnea on presentation.  Also presented with leukocytosis and elevated lactic acid.Both have improved. Patient was started on IV fluids and broad-spectrum antibiotics. Please consider de-escalating the antibiotics in 1-2 days. Currently she is afebrile.  White cell counts improving.  Most likely the source is urine.  We will follow-up cultures  Urinary tract infection: UA suggestive of this.  We will follow-up the urine culture.   Chest pain: Patient complained of chest pain on presentation.  Mild elevation of troponin but has normalized now.  EKG was not suggestive of any acute ischemic changes.  Patient does not complain of any chest pain at present.  CKD: Baseline creatinine ranges from 1.2-1.3.  Creatinine is close to baseline.  Patient is on gentle IV fluids.    Congestive heart failure:last EF 30-35%, s/p ICD.  On Lasix 40 mg at home which is on hold.   Currently her CHF looks compensated.  History of coronary artery disease: Continue her home meds.Currently chest pain-free.  History of gout/arthritis: Follows with rheumatologist.  On prensione 10 mg daily.  Continue pain medications.  Currently on colchicine and prednisone.  PT/OT consult requested.Recommended home health. She was started on prednisone 60 mg daily ,we will taper it to 40 mg, continue tapering.  Elevated liver enzymes: Improving  Hypertension: Currently blood pressure on lower side. She is on Coreg, clonidine, lasix, losartan at home which have been held.  We will start gentle IV fluids     DVT prophylaxis: Lovenox Code Status: Full Family Communication: None present on the bedside Disposition Plan: likely Home after resolution of sepsis   Consultants: None  Procedures: None  Antimicrobials: Vancomycin and Zosyn since 08/31/17  Subjective: Patient seen and examined the bedside .  Feels better slightly but complains of diffuse pain all over her joints especially left foot.  She is hemodynamically stable and is afebrile now.  Objective: Vitals:   09/01/17 0200 09/01/17 0201 09/01/17 0246 09/01/17 0600  BP: 123/85  (!) 154/94 134/82  Pulse: 95  (!) 102 98  Resp: 20  20 19   Temp:  98.2 F (36.8 C) 97.8 F (36.6 C) 97.9 F (36.6 C)  TempSrc:  Oral Oral Oral  SpO2: 99%  100% 100%  Weight:   82.1 kg (181 lb 1.6 oz)   Height:   5\' 2"  (1.575 m)     Intake/Output Summary (Last 24 hours) at 09/01/2017 1303 Last data filed at 09/01/2017 0303 Gross per 24 hour  Intake 400 ml  Output -  Net  400 ml   Filed Weights   08/31/17 1534 09/01/17 0246  Weight: 82.6 kg (182 lb) 82.1 kg (181 lb 1.6 oz)    Examination:  General exam: Not in distress,average built HEENT:PERRL,Oral mucosa moist, Ear/Nose normal on gross exam Respiratory system: Bilateral equal air entry, normal vesicular breath sounds, no wheezes or crackles  Cardiovascular system: S1 & S2 heard, RRR.  No JVD, murmurs, rubs, gallops or clicks. No pedal edema. Gastrointestinal system: Abdomen is nondistended, soft and nontender. No organomegaly or masses felt. Normal bowel sounds heard. Central nervous system: Alert and oriented. No focal neurological deficits. Extremities: No edema, no clubbing ,no cyanosis, distal peripheral pulses palpable, tenderness of the right foot Skin: No rashes, lesions or ulcers,no icterus ,no pallor MSK: Normal muscle bulk,tone ,power Psychiatry: Judgement and insight appear normal. Mood & affect appropriate.     Data Reviewed: I have personally reviewed following labs and imaging studies  CBC: Recent Labs  Lab 08/31/17 1733 09/01/17 0458  WBC 15.5* 12.8*  NEUTROABS 11.5*  --   HGB 12.6 10.1*  HCT 37.4 30.1*  MCV 87.8 88.5  PLT 316 478   Basic Metabolic Panel: Recent Labs  Lab 08/31/17 1733 09/01/17 0458  NA 133* 137  K 3.6 3.8  CL 94* 102  CO2 25 24  GLUCOSE 138* 153*  BUN 13 14  CREATININE 1.43* 1.36*  CALCIUM 10.1 9.0   GFR: Estimated Creatinine Clearance: 48.6 mL/min (A) (by C-G formula based on SCr of 1.36 mg/dL (H)). Liver Function Tests: Recent Labs  Lab 08/31/17 1733 09/01/17 0458  AST 93* 44*  ALT 72* 47  ALKPHOS 170* 123  BILITOT 2.7* 1.6*  PROT 9.6* 7.4  ALBUMIN 3.8 2.8*   No results for input(s): LIPASE, AMYLASE in the last 168 hours. No results for input(s): AMMONIA in the last 168 hours. Coagulation Profile: No results for input(s): INR, PROTIME in the last 168 hours. Cardiac Enzymes: Recent Labs  Lab 08/31/17 1733 08/31/17 2259 09/01/17 0458  TROPONINI 0.03* 0.03* <0.03   BNP (last 3 results) No results for input(s): PROBNP in the last 8760 hours. HbA1C: No results for input(s): HGBA1C in the last 72 hours. CBG: No results for input(s): GLUCAP in the last 168 hours. Lipid Profile: No results for input(s): CHOL, HDL, LDLCALC, TRIG, CHOLHDL, LDLDIRECT in the last 72 hours. Thyroid Function Tests: No  results for input(s): TSH, T4TOTAL, FREET4, T3FREE, THYROIDAB in the last 72 hours. Anemia Panel: No results for input(s): VITAMINB12, FOLATE, FERRITIN, TIBC, IRON, RETICCTPCT in the last 72 hours. Sepsis Labs: Recent Labs  Lab 08/31/17 1735 08/31/17 1747 08/31/17 1901 08/31/17 2259  PROCALCITON  --   --   --  0.85  LATICACIDVEN 2.12* 2.36* 1.56  --     Recent Results (from the past 240 hour(s))  Blood Culture (routine x 2)     Status: None (Preliminary result)   Collection Time: 08/31/17  5:33 PM  Result Value Ref Range Status   Specimen Description   Final    BLOOD LEFT ARM Performed at [facility] [address], [place], [place] [address]    Special Requests   Final    BOTTLES DRAWN AEROBIC AND ANAEROBIC Blood Culture adequate volume Performed at [facility] [address] [place], [place] [address]    Culture   Final    NO GROWTH < 12 HOURS Performed at [facility] [address] [place],  [address]    Report Status  PENDING  Incomplete  Respiratory Panel by PCR     Status: None   Collection Time: 08/31/17  7:24 PM  Result Value Ref Range Status   Adenovirus NOT DETECTED NOT DETECTED Final   Coronavirus 229E NOT DETECTED NOT DETECTED Final   Coronavirus HKU1 NOT DETECTED NOT DETECTED Final   Coronavirus NL63 NOT DETECTED NOT DETECTED Final   Coronavirus OC43 NOT DETECTED NOT DETECTED Final   Metapneumovirus NOT DETECTED NOT DETECTED Final   Rhinovirus / Enterovirus NOT DETECTED NOT DETECTED Final   Influenza A NOT DETECTED NOT DETECTED Final   Influenza B NOT DETECTED NOT DETECTED Final   Parainfluenza Virus 1 NOT DETECTED NOT DETECTED Final   Parainfluenza Virus 2 NOT DETECTED NOT DETECTED Final   Parainfluenza Virus 3 NOT DETECTED NOT DETECTED Final   Parainfluenza Virus 4 NOT DETECTED NOT DETECTED Final   Respiratory Syncytial Virus NOT DETECTED NOT DETECTED Final   Bordetella pertussis NOT DETECTED NOT  DETECTED Final   Chlamydophila pneumoniae NOT DETECTED NOT DETECTED Final   Mycoplasma pneumoniae NOT DETECTED NOT DETECTED Final    Comment: Performed at [facility], [place] [address] [place], [place] [address]         Radiology Studies: Dg Chest 2 View  Result Date: 08/31/2017 CLINICAL DATA:  Chest pain EXAM: CHEST - 2 VIEW COMPARISON:  July 02, 2017 FINDINGS: The subcutaneous implantable defibrillator is again noted with lead position unchanged. Heart is borderline enlarged with pulmonary vascularity within normal limits. No edema or consolidation. No pneumothorax. No bone lesions. IMPRESSION: Stable cardiac prominence. No edema or consolidation. Stable positioning of subcutaneous implantable defibrillator lead. Electronically Signed   By: Lowella Grip III M.D.   On: 08/31/2017 17:30   Ct Angio Chest Pe W And/or Wo Contrast  Result Date: 08/31/2017 CLINICAL DATA:  52 year old female with intermittent chest pain since last night. EXAM: CT ANGIOGRAPHY CHEST WITH CONTRAST TECHNIQUE: Multidetector CT imaging of the chest was performed using the standard protocol during bolus administration of intravenous contrast. Multiplanar CT image reconstructions and MIPs were obtained to evaluate the vascular anatomy. CONTRAST:  56mL ISOVUE-370 IOPAMIDOL (ISOVUE-370) INJECTION 76% COMPARISON:  CTA chest abdomen and pelvis 07/02/2017 FINDINGS: Cardiovascular: Good contrast bolus timing in the pulmonary arterial tree. Mild respiratory motion, moderate in both lower lobes. Subsequently, the distal bilateral lower lobe branches are not well evaluated. But otherwise there is no focal filling defect identified in the pulmonary arteries to suggest acute pulmonary embolism. Stable mild cardiomegaly. No pericardial effusion. Negative thoracic and upper abdominal aorta aside from mild soft atherosclerosis. No definite calcified coronary artery atherosclerosis. Mediastinum/Nodes: Stable and negative.  Lungs/Pleura: Lower lung volumes. Atelectatic changes to the hilar airways, greater on the left. The central airways are patent. Mild dependent atelectasis in both lungs. No pleural effusion or other abnormal pulmonary opacity. Upper Abdomen: The visible upper abdomen is stable and negative. Musculoskeletal: Small intramuscular lipoma in the right posterior shoulder musculature incidentally noted and benign (series 4, image 11). Left axillary or lateral chest wall AICD generator device with a single subcutaneous left lateral and central chest wall lead is stable. No acute osseous abnormality identified. Review of the MIP images confirms the above findings. IMPRESSION: 1. No acute pulmonary embolus identified. The distal bilateral lower lobe branches are obscured by motion. 2. Lower lung volumes with atelectasis. No other acute pulmonary process. 3. Stable cardiomegaly.  No pericardial effusion. Electronically Signed   By: Genevie Ann M.D.   On: 08/31/2017 20:38  Scheduled Meds: . carvedilol  12.5 mg Oral BID WC  . cloNIDine  0.1 mg Oral Daily  . colchicine  0.6 mg Oral BID  . enoxaparin (LOVENOX) injection  40 mg Subcutaneous Q24H  . furosemide  40 mg Oral Daily  . isosorbide mononitrate  30 mg Oral Daily  . losartan  50 mg Oral Daily  . pantoprazole  40 mg Oral Daily  . [START ON 09/02/2017] predniSONE  10 mg Oral Q breakfast  . zolpidem  5 mg Oral QHS   Continuous Infusions: . piperacillin-tazobactam (ZOSYN)  IV Stopped (09/01/17 1003)  . vancomycin       LOS: 1 day    Time spent: 25 mins.More than 50% of that time was spent in counseling and/or coordination of care.  Please Note: This patient record was dictated using Editor, commissioning. Chart creation errors have been sought, but may not always have been located. Such creation errors do not reflect on the Standard of Medical Care.     Shelly Coss, MD [facility] Pager [phone]  If 7PM-7AM, please contact  night-coverage [email or web address] Password [facility] 09/01/2017, 1:03 PM

## 2017-09-01 NOTE — Progress Notes (Signed)
Made MD aware of current VSs, BP decreased, orders received. SRP, RN

## 2017-09-01 NOTE — ED Notes (Signed)
ED TO INPATIENT HANDOFF REPORT  Name/Age/Gender Kathy Bruce 52 y.o. female  Code Status    Code Status Orders  (From admission, onward)        Start     Ordered   08/31/17 2154  Full code  Continuous     08/31/17 2207    Code Status History    Date Active Date Inactive Code Status Order ID Comments User Context   07/02/2017 2047 07/05/2017 1403 Full Code [id number]  Jani Gravel, MD Inpatient      Home/SNF/Other Home  Chief Complaint chest pain; Multiple complaints  Level of Care/Admitting Diagnosis ED Disposition    ED Disposition Condition [facility] Area: [facility] [[id number]]  Level of Care: Telemetry [5]  Admit to tele based on following criteria: Complex arrhythmia (Bradycardia/Tachycardia)  Diagnosis: Sepsis [facility]) [[id number]]  Admitting Physician: Norval Morton [[id number]]  Attending Physician: Norval Morton [[id number]]  Estimated length of stay: past midnight tomorrow  Certification:: I certify this patient will need inpatient services for at least 2 midnights  PT Class (Do Not Modify): Inpatient [101]  PT Acc Code (Do Not Modify): Private [1]       Medical History Past Medical History:  Diagnosis Date  . Coronary artery disease involving native heart with angina pectoris [facility]) 2014   Patient reports MI in 2013 (in [place]) -- 2nd [place] 2014 in [place], [place] --? stent placement when patient was living in [place]. Need outside records.   . Dilated cardiomyopathy ([place]) 2013   Patient reports this was secondary to MI and she to have AICD placed.   . Hypertensive heart disease with chronic combined systolic and diastolic congestive heart failure ([place]) 2013  . ICD (implantable cardioverter-defibrillator) in place, [facility], 2013 08/29/2013   [facility] in [place], (DR. [place])    Allergies No Known Allergies  IV Location/Drains/Wounds Patient Lines/Drains/Airways Status    Active Line/Drains/Airways    Name:   Placement date:   Placement time:   Site:   Days:   Peripheral IV 08/31/17 Left Antecubital   08/31/17    1730    Antecubital   1          Labs/Imaging Results for orders placed or performed during the hospital encounter of 08/31/17 (from the past 48 hour(s))  I-stat troponin, ED     Status: None   Collection Time: 08/31/17  5:33 PM  Result Value Ref Range   Troponin i, poc 0.00 0.00 - 0.08 ng/mL   Comment 3            Comment: Due to the release kinetics of cTnI, a negative result within the first hours of the onset of symptoms does not rule out myocardial infarction with certainty. If myocardial infarction is still suspected, repeat the test at appropriate intervals.   CBC with Differential     Status: Abnormal   Collection Time: 08/31/17  5:33 PM  Result Value Ref Range   WBC 15.5 (H) 4.0 - 10.5 K/uL   RBC 4.26 3.87 - 5.11 MIL/uL   Hemoglobin 12.6 12.0 - 15.0 g/dL   HCT 37.4 36.0 - 46.0 %   MCV 87.8 78.0 - 100.0 fL   MCH 29.6 26.0 - 34.0 pg   MCHC 33.7 30.0 - 36.0 g/dL   RDW 14.1 11.5 - 15.5 %   Platelets 316 150 - 400 K/uL   Neutrophils Relative % 74 %   Neutro Abs 11.5 (  H) 1.7 - 7.7 K/uL   Lymphocytes Relative 12 %   Lymphs Abs 1.8 0.7 - 4.0 K/uL   Monocytes Relative 14 %   Monocytes Absolute 2.2 (H) 0.1 - 1.0 K/uL   Eosinophils Relative 0 %   Eosinophils Absolute 0.0 0.0 - 0.7 K/uL   Basophils Relative 0 %   Basophils Absolute 0.0 0.0 - 0.1 K/uL    Comment: Performed at [facility], [place] [address], [place], [place] [address]  Comprehensive metabolic panel     Status: Abnormal   Collection Time: 08/31/17  5:33 PM  Result Value Ref Range   Sodium 133 (L) 135 - 145 mmol/L   Potassium 3.6 3.5 - 5.1 mmol/L   Chloride 94 (L) 101 - 111 mmol/L   CO2 25 22 - 32 mmol/L   Glucose, Bld 138 (H) 65 - 99 mg/dL   BUN 13 6 - 20 mg/dL   Creatinine, Ser 1.43 (H) 0.44 - 1.00 mg/dL   Calcium 10.1 8.9 - 10.3 mg/dL    Total Protein 9.6 (H) 6.5 - 8.1 g/dL   Albumin 3.8 3.5 - 5.0 g/dL   AST 93 (H) 15 - 41 U/L   ALT 72 (H) 14 - 54 U/L   Alkaline Phosphatase 170 (H) 38 - 126 U/L   Total Bilirubin 2.7 (H) 0.3 - 1.2 mg/dL   GFR calc non Af Amer 42 (L) >60 mL/min   GFR calc Af Amer 48 (L) >60 mL/min    Comment: (NOTE) The eGFR has been calculated using the CKD EPI equation. This calculation has not been validated in all clinical situations. eGFR's persistently <60 mL/min signify possible Chronic Kidney Disease.    Anion gap 14 5 - 15    Comment: Performed at [facility], [place] [address] [place], [place] [address]  Troponin I     Status: Abnormal   Collection Time: 08/31/17  5:33 PM  Result Value Ref Range   Troponin I 0.03 (HH) <0.03 ng/mL    Comment: CRITICAL RESULT CALLED TO, READ BACK BY AND VERIFIED WITH: Dolores Hoose [id number] @ 1839 BY Martina Sinner Performed at [place] [address] [place], [place] [address]   hCG, quantitative, pregnancy     Status: None   Collection Time: 08/31/17  5:33 PM  Result Value Ref Range   hCG, Beta Chain, Quant, S 1 <5 mIU/mL    Comment:          GEST. AGE      CONC.  (mIU/mL)   <=1 WEEK        5 - 50     2 WEEKS       50 - 500     3 WEEKS       100 - 10,000     4 WEEKS     1,000 - 30,000     5 WEEKS     3,500 - 115,000   6-8 WEEKS     12,000 - 270,000    12 WEEKS     15,000 - 220,000        FEMALE AND NON-PREGNANT FEMALE:     LESS THAN 5 mIU/mL Performed at [facility], [place] [address] [place], [place] [address]   I-Stat CG4 Lactic Acid, ED     Status: Abnormal   Collection Time: 08/31/17  5:35 PM  Result Value Ref Range   Lactic Acid, Venous 2.12 (HH) 0.5 - 1.9 mmol/L  Comment NOTIFIED PHYSICIAN   I-Stat beta hCG blood, ED     Status: Abnormal   Collection Time: 08/31/17  5:36 PM  Result Value Ref Range   I-stat hCG, quantitative 28.9 (H) <5 mIU/mL   Comment 3            Comment:   GEST. AGE       CONC.  (mIU/mL)   <=1 WEEK        5 - 50     2 WEEKS       50 - 500     3 WEEKS       100 - 10,000     4 WEEKS     1,000 - 30,000        FEMALE AND NON-PREGNANT FEMALE:     LESS THAN 5 mIU/mL   I-Stat CG4 Lactic Acid, ED     Status: Abnormal   Collection Time: 08/31/17  5:47 PM  Result Value Ref Range   Lactic Acid, Venous 2.36 (HH) 0.5 - 1.9 mmol/L   Comment NOTIFIED PHYSICIAN   I-Stat CG4 Lactic Acid, ED     Status: None   Collection Time: 08/31/17  7:01 PM  Result Value Ref Range   Lactic Acid, Venous 1.56 0.5 - 1.9 mmol/L  Urinalysis, Routine w reflex microscopic     Status: Abnormal   Collection Time: 08/31/17  7:18 PM  Result Value Ref Range   Color, Urine AMBER (A) YELLOW    Comment: BIOCHEMICALS MAY BE AFFECTED BY COLOR   APPearance HAZY (A) CLEAR   Specific Gravity, Urine 1.024 1.005 - 1.030   pH 5.0 5.0 - 8.0   Glucose, UA NEGATIVE NEGATIVE mg/dL   Hgb urine dipstick SMALL (A) NEGATIVE   Bilirubin Urine SMALL (A) NEGATIVE   Ketones, ur NEGATIVE NEGATIVE mg/dL   Protein, ur >=300 (A) NEGATIVE mg/dL   Nitrite NEGATIVE NEGATIVE   Leukocytes, UA SMALL (A) NEGATIVE   RBC / HPF 0-5 0 - 5 RBC/hpf   WBC, UA TOO NUMEROUS TO COUNT 0 - 5 WBC/hpf   Bacteria, UA RARE (A) NONE SEEN   Squamous Epithelial / LPF 6-30 (A) NONE SEEN   Mucus PRESENT     Comment: Performed at [facility], [place] [address] [place], [place] [address]  Influenza panel by PCR (type A & B)     Status: None   Collection Time: 08/31/17  7:18 PM  Result Value Ref Range   Influenza A By PCR NEGATIVE NEGATIVE   Influenza B By PCR NEGATIVE NEGATIVE    Comment: (NOTE) The Xpert Xpress Flu assay is intended as an aid in the diagnosis of  influenza and should not be used as a sole basis for treatment.  This  assay is FDA approved for nasopharyngeal swab specimens only. Nasal  washings and aspirates are unacceptable for Xpert Xpress Flu testing. Performed at [facility], [place] [address] [place], [place] [address]   Procalcitonin     Status: None   Collection Time: 08/31/17 10:59 PM  Result Value Ref Range   Procalcitonin 0.85 ng/mL    Comment:        Interpretation: PCT > 0.5 ng/mL and <= 2 ng/mL: Systemic infection (sepsis) is possible, but other conditions are known to elevate PCT as well. (NOTE)       Sepsis PCT Algorithm           Lower Respiratory Tract  Infection PCT Algorithm    ----------------------------     ----------------------------         PCT < 0.25 ng/mL                PCT < 0.10 ng/mL         Strongly encourage             Strongly discourage   discontinuation of antibiotics    initiation of antibiotics    ----------------------------     -----------------------------       PCT 0.25 - 0.50 ng/mL            PCT 0.10 - 0.25 ng/mL               OR       >80% decrease in PCT            Discourage initiation of                                            antibiotics      Encourage discontinuation           of antibiotics    ----------------------------     -----------------------------         PCT >= 0.50 ng/mL              PCT 0.26 - 0.50 ng/mL                AND       <80% decrease in PCT             Encourage initiation of                                             antibiotics       Encourage continuation           of antibiotics    ----------------------------     -----------------------------        PCT >= 0.50 ng/mL                  PCT > 0.50 ng/mL               AND         increase in PCT                  Strongly encourage                                      initiation of antibiotics    Strongly encourage escalation           of antibiotics                                     -----------------------------                                           PCT <= 0.25 ng/mL  OR                                        > 80% decrease in  PCT                                     Discontinue / Do not initiate                                             antibiotics Performed at [place] [address] [place], [place] [address]   Troponin I     Status: Abnormal   Collection Time: 08/31/17 10:59 PM  Result Value Ref Range   Troponin I 0.03 (HH) <0.03 ng/mL    Comment: CRITICAL RESULT CALLED TO, READ BACK BY AND VERIFIED WITHArnold Long RN 0004 09/01/17 A NAVARRO Performed at [facility], [place] [address], [place], [place] [address]   Uric acid     Status: Abnormal   Collection Time: 08/31/17 10:59 PM  Result Value Ref Range   Uric Acid, Serum 7.1 (H) 2.3 - 6.6 mg/dL    Comment: Performed at [facility], [place] [address], [place], [place] [address]  Sedimentation rate     Status: Abnormal   Collection Time: 08/31/17 10:59 PM  Result Value Ref Range   Sed Rate 120 (H) 0 - 22 mm/hr    Comment: Performed at [facility], [place] [address], [place], [place] [address]   Dg Chest 2 View  Result Date: 08/31/2017 CLINICAL DATA:  Chest pain EXAM: CHEST - 2 VIEW COMPARISON:  July 02, 2017 FINDINGS: The subcutaneous implantable defibrillator is again noted with lead position unchanged. Heart is borderline enlarged with pulmonary vascularity within normal limits. No edema or consolidation. No pneumothorax. No bone lesions. IMPRESSION: Stable cardiac prominence. No edema or consolidation. Stable positioning of subcutaneous implantable defibrillator lead. Electronically Signed   By: Lowella Grip III M.D.   On: 08/31/2017 17:30   Ct Angio Chest Pe W And/or Wo Contrast  Result Date: 08/31/2017 CLINICAL DATA:  52 year old female with intermittent chest pain since last night. EXAM: CT ANGIOGRAPHY CHEST WITH CONTRAST TECHNIQUE: Multidetector CT imaging of the chest was performed using the standard protocol during bolus administration of intravenous contrast.  Multiplanar CT image reconstructions and MIPs were obtained to evaluate the vascular anatomy. CONTRAST:  78m ISOVUE-370 IOPAMIDOL (ISOVUE-370) INJECTION 76% COMPARISON:  CTA chest abdomen and pelvis 07/02/2017 FINDINGS: Cardiovascular: Good contrast bolus timing in the pulmonary arterial tree. Mild respiratory motion, moderate in both lower lobes. Subsequently, the distal bilateral lower lobe branches are not well evaluated. But otherwise there is no focal filling defect identified in the pulmonary arteries to suggest acute pulmonary embolism. Stable mild cardiomegaly. No pericardial effusion. Negative thoracic and upper abdominal aorta aside from mild soft atherosclerosis. No definite calcified coronary artery atherosclerosis. Mediastinum/Nodes: Stable and negative. Lungs/Pleura: Lower lung volumes. Atelectatic changes to the hilar airways, greater on the left. The central airways are patent. Mild dependent atelectasis in both lungs. No pleural effusion or other abnormal pulmonary opacity. Upper Abdomen: The visible upper abdomen is stable and negative. Musculoskeletal: Small intramuscular lipoma in the right posterior  shoulder musculature incidentally noted and benign (series 4, image 11). Left axillary or lateral chest wall AICD generator device with a single subcutaneous left lateral and central chest wall lead is stable. No acute osseous abnormality identified. Review of the MIP images confirms the above findings. IMPRESSION: 1. No acute pulmonary embolus identified. The distal bilateral lower lobe branches are obscured by motion. 2. Lower lung volumes with atelectasis. No other acute pulmonary process. 3. Stable cardiomegaly.  No pericardial effusion. Electronically Signed   By: Genevie Ann M.D.   On: 08/31/2017 20:38    Pending Labs Unresulted Labs (From admission, onward)   Start     Ordered   09/01/17 0500  CBC  Tomorrow morning,   R     08/31/17 2207   09/01/17 0500  Comprehensive metabolic panel   Tomorrow morning,   R     08/31/17 2207   08/31/17 2235  C-reactive protein  Add-on,   R     08/31/17 2234   08/31/17 2207  Troponin I  Now then every 6 hours,   R     08/31/17 2207   08/31/17 2126  Respiratory Panel by PCR  (Respiratory virus panel)  Add-on,   R     08/31/17 2125   08/31/17 1718  Blood Culture (routine x 2)  BLOOD CULTURE X 2,   STAT     08/31/17 1717   08/31/17 1718  Urine culture  STAT,   STAT     08/31/17 1717      Vitals/Pain Today's Vitals   08/31/17 2230 08/31/17 2330 09/01/17 0000 09/01/17 0030  BP: 125/84 120/80 125/74 114/76  Pulse: 97 94 95 93  Resp: 19 19 (!) 9 18  Temp:      TempSrc:      SpO2: 97% 98% 100% (!) 81%  Weight:      Height:      PainSc:        Isolation Precautions Droplet precaution  Medications Medications  iopamidol (ISOVUE-370) 76 % injection (has no administration in time range)  carvedilol (COREG) tablet 12.5 mg (has no administration in time range)  cloNIDine (CATAPRES) tablet 0.1 mg (has no administration in time range)  furosemide (LASIX) tablet 40 mg (has no administration in time range)  isosorbide mononitrate (IMDUR) 24 hr tablet 30 mg (has no administration in time range)  losartan (COZAAR) tablet 50 mg (has no administration in time range)  colchicine tablet 0.6 mg (has no administration in time range)  pantoprazole (PROTONIX) EC tablet 40 mg (has no administration in time range)  predniSONE (DELTASONE) tablet 10 mg (has no administration in time range)  polyvinyl alcohol (LIQUIFILM TEARS) 1.4 % ophthalmic solution 2 drop (has no administration in time range)  zolpidem (AMBIEN) tablet 5 mg (has no administration in time range)  nitroGLYCERIN (NITROSTAT) SL tablet 0.4 mg (has no administration in time range)  enoxaparin (LOVENOX) injection 40 mg (has no administration in time range)  acetaminophen (TYLENOL) tablet 650 mg (has no administration in time range)    Or  acetaminophen (TYLENOL) suppository 650 mg (has  no administration in time range)  ondansetron (ZOFRAN) tablet 4 mg (has no administration in time range)    Or  ondansetron (ZOFRAN) injection 4 mg (has no administration in time range)  albuterol (PROVENTIL) (2.5 MG/3ML) 0.083% nebulizer solution 2.5 mg (has no administration in time range)  piperacillin-tazobactam (ZOSYN) IVPB 3.375 g (has no administration in time range)  vancomycin (VANCOCIN) IVPB 1000 mg/200 mL premix (  has no administration in time range)  0.9 %  sodium chloride infusion (has no administration in time range)  HYDROmorphone (DILAUDID) injection 1 mg (has no administration in time range)  oxyCODONE-acetaminophen (PERCOCET/ROXICET) 5-325 MG per tablet 1 tablet (has no administration in time range)    And  oxyCODONE (Oxy IR/ROXICODONE) immediate release tablet 5 mg (has no administration in time range)  HYDROmorphone (DILAUDID) injection 1 mg (1 mg Intravenous Given 08/31/17 1742)  ondansetron (ZOFRAN) injection 4 mg (4 mg Intravenous Given 08/31/17 1742)  sodium chloride 0.9 % bolus 500 mL (500 mLs Intravenous Given 08/31/17 1742)  acetaminophen (TYLENOL) tablet 1,000 mg (1,000 mg Oral Given 08/31/17 1743)  hydrocortisone sodium succinate (SOLU-CORTEF) 100 MG injection 100 mg (100 mg Intravenous Given 08/31/17 1743)  piperacillin-tazobactam (ZOSYN) IVPB 3.375 g (0 g Intravenous Stopped 08/31/17 1845)  vancomycin (VANCOCIN) IVPB 1000 mg/200 mL premix (0 mg Intravenous Stopped 08/31/17 2018)  sodium chloride 0.9 % bolus 500 mL (500 mLs Intravenous Given 08/31/17 1914)  HYDROmorphone (DILAUDID) injection 1 mg (1 mg Intravenous Given 08/31/17 2020)  iopamidol (ISOVUE-370) 76 % injection 100 mL (75 mLs Intravenous Contrast Given 08/31/17 2021)    Mobility walks

## 2017-09-01 NOTE — Evaluation (Signed)
Occupational Therapy Evaluation Patient Details Name: Kathy Bruce MRN: [id number] DOB: Sep 05, 1965 Today's Date: 09/01/2017    History of Present Illness pt was admitted for SIRS/sepsis.  PMH:  CHF, ICD, gout, OA   Clinical Impression   This 52 year old female was admitted for the above.  She will benefit from continued OT to increase safety and independence.  Pt will benefit from AE. She has very limited ROM/function in RUE. Will bring this on next visit. Goals are for mostly set up to min guard assist    Follow Up Recommendations  Home health OT    Equipment Recommendations  (to be further assessed)    Recommendations for Other Services       Precautions / Restrictions Precautions Precautions: Fall Restrictions Weight Bearing Restrictions: No      Mobility Bed Mobility               General bed mobility comments: oob  Transfers                      Balance                                           ADL either performed or assessed with clinical judgement   ADL Overall ADL's : Needs assistance/impaired Eating/Feeding: Set up;Sitting   Grooming: Minimal assistance;Sitting   Upper Body Bathing: Minimal assistance;Sitting   Lower Body Bathing: Maximal assistance;Sit to/from stand   Upper Body Dressing : Minimal assistance;Sitting   Lower Body Dressing: Maximal assistance;Sit to/from stand                 General ADL Comments: will issue AE on next visit.  Pt has very limited R hand movement.  Pt would benefit from wash mitt, long sponge, reacher, and shoehorn. She is L hand dominant     Vision         Perception     Praxis      Pertinent Vitals/Pain Pain Assessment: Faces Faces Pain Scale: Hurts even more Pain Location: RUE, both knees, feet Pain Descriptors / Indicators: Aching Pain Intervention(s): Limited activity within patient's tolerance;Monitored during session     Hand Dominance Left    Extremity/Trunk Assessment Upper Extremity Assessment Upper Extremity Assessment: RUE deficits/detail RUE Deficits / Details: painful throughout arm.  Has >1/4 grip.  Doesn't use it much           Communication Communication Communication: No difficulties   Cognition Arousal/Alertness: Awake/alert Behavior During Therapy: WFL for tasks assessed/performed Overall Cognitive Status: Within Functional Limits for tasks assessed                                     General Comments       Exercises     Shoulder Instructions      Home Living Family/patient expects to be discharged to:: Private residence Living Arrangements: Other relatives Available Help at Discharge: Family               Bathroom Shower/Tub: Teacher, early years/pre: Handicapped height         Additional Comments: has tub bench from [place] which she hasn't assembled      Prior Functioning/Environment Level of Independence: Needs assistance        Comments:  recently moved here from [place].  Family assists with adls        OT Problem List: Decreased strength;Decreased activity tolerance;Impaired balance (sitting and/or standing);Pain;Decreased knowledge of use of DME or AE;Impaired UE functional use      OT Treatment/Interventions: Self-care/ADL training;Therapeutic exercise;DME and/or AE instruction;Energy conservation;Therapeutic activities;Patient/family education;Balance training    OT Goals(Current goals can be found in the care plan section) Acute Rehab OT Goals Patient Stated Goal: be more independent OT Goal Formulation: With patient Time For Goal Achievement: 09/15/17 Potential to Achieve Goals: Good ADL Goals Pt Will Perform Grooming: with set-up;sitting Pt Will Perform Upper Body Bathing: with set-up;with adaptive equipment;sitting Pt Will Perform Lower Body Bathing: with min guard assist;with adaptive equipment;sit to/from stand Pt Will Perform Upper Body  Dressing: with set-up;sitting Pt Will Perform Lower Body Dressing: with mod assist;sit to/from stand;with adaptive equipment Pt Will Transfer to Toilet: with min guard assist;ambulating;regular height toilet;bedside commode(vs) Pt Will Perform Toileting - Clothing Manipulation and hygiene: with min guard assist;sit to/from stand  OT Frequency: Min 2X/week   Barriers to D/C:            Co-evaluation              AM-PAC PT "6 Clicks" Daily Activity     Outcome Measure Help from another person eating meals?: A Little Help from another person taking care of personal grooming?: A Little Help from another person toileting, which includes using toliet, bedpan, or urinal?: A Lot Help from another person bathing (including washing, rinsing, drying)?: A Lot Help from another person to put on and taking off regular upper body clothing?: A Little Help from another person to put on and taking off regular lower body clothing?: A Lot 6 Click Score: 15   End of Session    Activity Tolerance: Patient tolerated treatment well Patient left: in chair;with call bell/phone within reach  OT Visit Diagnosis: Muscle weakness (generalized) (M62.81)                Time: 3664-4034 OT Time Calculation (min): 18 min Charges:  OT General Charges $OT Visit: 1 Visit OT Evaluation $OT Eval Moderate Complexity: 1 Mod G-Codes:     [place], OTR/L [phone] 09/01/2017  Myer Bohlman 09/01/2017, 12:47 PM

## 2017-09-02 DIAGNOSIS — R6510 Systemic inflammatory response syndrome (SIRS) of non-infectious origin without acute organ dysfunction: Secondary | ICD-10-CM

## 2017-09-02 DIAGNOSIS — M1000 Idiopathic gout, unspecified site: Secondary | ICD-10-CM

## 2017-09-02 DIAGNOSIS — A419 Sepsis, unspecified organism: Principal | ICD-10-CM

## 2017-09-02 MED ORDER — SODIUM CHLORIDE 0.9 % IV SOLN
1.0000 g | INTRAVENOUS | Status: DC
  Administered 2017-09-02: 1 g via INTRAVENOUS
  Filled 2017-09-02: qty 1
  Filled 2017-09-02: qty 10

## 2017-09-02 MED ORDER — MORPHINE SULFATE (PF) 4 MG/ML IV SOLN
2.0000 mg | INTRAVENOUS | Status: DC | PRN
  Administered 2017-09-02 – 2017-09-03 (×3): 2 mg via INTRAVENOUS
  Filled 2017-09-02 (×3): qty 1

## 2017-09-02 MED ORDER — PREDNISONE 20 MG PO TABS
40.0000 mg | ORAL_TABLET | Freq: Every day | ORAL | Status: DC
  Administered 2017-09-03 – 2017-09-06 (×3): 40 mg via ORAL
  Filled 2017-09-02 (×5): qty 2

## 2017-09-02 MED ORDER — METHYLPREDNISOLONE SODIUM SUCC 40 MG IJ SOLR
40.0000 mg | Freq: Once | INTRAMUSCULAR | Status: AC
  Administered 2017-09-02: 40 mg via INTRAVENOUS
  Filled 2017-09-02: qty 1

## 2017-09-02 NOTE — Evaluation (Signed)
Physical Therapy Evaluation Patient Details Name: Kathy Bruce MRN: [id number] DOB: 09/25/1965 Today's Date: 09/02/2017   History of Present Illness  Pt is a 52 year old feamle admitted for SIRS/sepsis.  PMH:  CHF, ICD, gout, OA  Clinical Impression  Pt admitted with above diagnosis. Pt currently with functional limitations due to the deficits listed below (see PT Problem List).  Pt will benefit from skilled PT to increase their independence and safety with mobility to allow discharge to the venue listed below.  Pt reports increased pain from gout limiting mobility at this time and has very antalgic gait, only tolerated in room distance.  Pt states she has been in [place] for 7 months with her fiance, relocated from [place], and has no family/friends in the immediate area.      Follow Up Recommendations Home health PT;Supervision - Intermittent    Equipment Recommendations  None recommended by PT    Recommendations for Other Services       Precautions / Restrictions Precautions Precautions: Fall      Mobility  Bed Mobility               General bed mobility comments: pt up in recliner on arrival  Transfers Overall transfer level: Needs assistance Equipment used: None Transfers: Sit to/from Stand Sit to Stand: Min guard         General transfer comment: utilizes UEs to self assist, R UE limited by pain  Ambulation/Gait Ambulation/Gait assistance: Min guard Ambulation Distance (Feet): 8 Feet Assistive device: None Gait Pattern/deviations: Step-to pattern;Decreased stance time - right;Decreased weight shift to right;Antalgic     General Gait Details: pt declines use of RW due to R hand pain, somewhat used IV pole and bed rail to support due to increased bil knee and feet pain (R side greater than L), distance limited by therapist as pt presents in increased pain  Stairs            Wheelchair Mobility    Modified Rankin (Stroke Patients Only)        Balance                                             Pertinent Vitals/Pain Pain Assessment: 0-10 Pain Score: 8  Pain Location: RUE, both knees, feet Pain Descriptors / Indicators: Aching Pain Intervention(s): Repositioned;Monitored during session;Limited activity within patient's tolerance;Premedicated before session    Home Living Family/patient expects to be discharged to:: Private residence Living Arrangements: Other relatives Available Help at Discharge: Family Type of Home: Apartment Home Access: Stairs to enter Entrance Stairs-Rails: Right Entrance Stairs-Number of Steps: 5 Home Layout: One level Home Equipment: Environmental consultant - 2 wheels Additional Comments: has tub bench from [place] which she hasn't assembled    Prior Function Level of Independence: Needs assistance         Comments: recently moved here from [place].  Family assists with adls     Hand Dominance   Dominant Hand: Left    Extremity/Trunk Assessment        Lower Extremity Assessment Lower Extremity Assessment: RLE deficits/detail;LLE deficits/detail RLE Deficits / Details: grossly able to perform 3/5 throughout, did not provide resistance due to "gout" pain, edematous ankles/feet LLE Deficits / Details: grossly able to perform 3/5 throughout, did not provide resistance due to "gout" pain, edematous ankles/feet       Communication   Communication:  No difficulties  Cognition Arousal/Alertness: Awake/alert Behavior During Therapy: WFL for tasks assessed/performed Overall Cognitive Status: Within Functional Limits for tasks assessed                                        General Comments      Exercises     Assessment/Plan    PT Assessment Patient needs continued PT services  PT Problem List Decreased strength;Decreased mobility;Decreased knowledge of use of DME;Decreased activity tolerance;Pain       PT Treatment Interventions DME instruction;Therapeutic  activities;Therapeutic exercise;Stair training;Gait training;Functional mobility training;Patient/family education;Balance training    PT Goals (Current goals can be found in the Care Plan section)  Acute Rehab PT Goals PT Goal Formulation: With patient Time For Goal Achievement: 09/09/17 Potential to Achieve Goals: Good    Frequency Min 3X/week   Barriers to discharge        Co-evaluation               AM-PAC PT "6 Clicks" Daily Activity  Outcome Measure Difficulty turning over in bed (including adjusting bedclothes, sheets and blankets)?: None Difficulty moving from lying on back to sitting on the side of the bed? : A Little Difficulty sitting down on and standing up from a chair with arms (e.g., wheelchair, bedside commode, etc,.)?: Unable Help needed moving to and from a bed to chair (including a wheelchair)?: A Little Help needed walking in hospital room?: A Lot Help needed climbing 3-5 steps with a railing? : Total 6 Click Score: 14    End of Session Equipment Utilized During Treatment: Gait belt Activity Tolerance: Patient limited by pain Patient left: in chair;with call bell/phone within reach   PT Visit Diagnosis: Difficulty in walking, not elsewhere classified (R26.2)    Time: 7371-0626 PT Time Calculation (min) (ACUTE ONLY): 13 min   Charges:   PT Evaluation $PT Eval Low Complexity: 1 Low     PT G CodesCarmelia Bake, PT, DPT 09/02/2017 Pager: [phone]  York Ram E 09/02/2017, 3:54 PM

## 2017-09-02 NOTE — Progress Notes (Signed)
PT Cancellation Note  Patient Details Name: Kathy Bruce MRN: [id number] DOB: 08/10/1965   Cancelled Treatment:    Reason Eval/Treat Not Completed: Pain limiting ability to participate Per OT, pt in pain this morning, will check back as schedule permits.   Collyns Mcquigg,KATHrine E 09/02/2017, 11:49 AM Carmelia Bake, PT, DPT 09/02/2017 Pager: [phone]

## 2017-09-02 NOTE — Progress Notes (Signed)
PROGRESS NOTE    Kathy Bruce  RCB:[id number] DOB: 1965/12/13 DOA: 08/31/2017 PCP: Hayden Rasmussen, MD    Brief Narrative:  52 year old female with past medical history significant for systolic CHF, last ejection fraction 30-35%, status post ICD, coronary artery disease, gout, arthritis on chronic steroid use who presents to the emergency department with complaints of generalized body ache and generalized weakness.  Patient was found to be febrile up to 103 F.  Patient was also having urinary frequency.  Patient's UA was suggestive of urinary tract infection and she was admitted for further management .  Assessment & Plan:   Principal Problem:   SIRS (systemic inflammatory response syndrome) (HCC) Active Problems:   ICD (implantable cardioverter-defibrillator) in place, BSCi, 2013   Chest pain   Coronary artery disease involving native heart with angina pectoris (HCC)   Gout   Renal insufficiency   HTN (hypertension)   Acute lower UTI  SIRS/possible sepsis: Was found to be febrile up to 103 Fahrenheit with tachycardia and tachypnea on presentation.  Also presented with leukocytosis and elevated lactic acid.Both have improved. Patient was started on IV fluids and broad-spectrum antibiotics. Clinically seems improved Urine cx pos for gm neg rods. Will de-escalate abx to rocephin monotherapy  Urinary tract infection with sepsis present on admit: UA suggestive of this.  Urine cx pos for gm neg in rods, now showing ecoli  Chest pain: Patient complained of chest pain on presentation.  Mild elevation of troponin but has normalized now.  EKG was not suggestive of any acute ischemic changes.  Pain free at this time  CKD: Baseline creatinine ranges from 1.2-1.3.  Creatinine is close to baseline. Continued on gentle IV fluids.    Congestive heart failure:last EF 30-35%,s/p ICD.  On Lasix 40 mg at home which is on hold.  Appears to be compensated at this time  History of coronary  artery disease: Continue her home meds. Presently chest pain-free.  History of gout/arthritis: Follows with rheumatologist. Had been on prensione 10 mg daily for several years.  Continued pain medications.  Currently on colchicine and prednisone.  PT/OT consult requested. Recommendations for home health noted She was started on prednisone 60 mg daily. Pain still largely unimproved. ESR of 120 with markedly elevated CRP, reviewed Will give one dose of IV solumedrol.  Elevated liver enzymes: Had improved. Continue to monitor  Hypertension: Currently blood pressure on lower side. She is on Coreg, clonidine, lasix, losartan at home which were held.  Continued on gentle IV fluids. BP improved   DVT prophylaxis: Lovenox subQ Code Status: Full Family Communication: Pt in room, family not at bedside Disposition Plan: Uncertain at this time  Consultants:     Procedures:     Antimicrobials: Anti-infectives (From admission, onward)   Start     Dose/Rate Route Frequency Ordered Stop   09/02/17 1400  cefTRIAXone (ROCEPHIN) 1 g in sodium chloride 0.9 % 100 mL IVPB     1 g 200 mL/hr over 30 Minutes Intravenous Every 24 hours 09/02/17 0941     09/01/17 1800  vancomycin (VANCOCIN) IVPB 1000 mg/200 mL premix  Status:  Discontinued     1,000 mg 200 mL/hr over 60 Minutes Intravenous Every 36 hours 08/31/17 2228 09/02/17 0941   08/31/17 2230  piperacillin-tazobactam (ZOSYN) IVPB 3.375 g  Status:  Discontinued     3.375 g 12.5 mL/hr over 240 Minutes Intravenous Every 8 hours 08/31/17 2224 09/02/17 0942   08/31/17 1730  piperacillin-tazobactam (ZOSYN) IVPB 3.375 g  3.375 g 100 mL/hr over 30 Minutes Intravenous  Once 08/31/17 1717 08/31/17 1845   08/31/17 1730  vancomycin (VANCOCIN) IVPB 1000 mg/200 mL premix     1,000 mg 200 mL/hr over 60 Minutes Intravenous  Once 08/31/17 1717 08/31/17 2018       Subjective: Complaining of diffuse joint pains mainly involving wrists and ankles  bilaterally  Objective: Vitals:   09/01/17 2101 09/02/17 0016 09/02/17 0413 09/02/17 1358  BP: (!) 116/55 121/63 114/66 (!) 143/78  Pulse: 77 81 86 78  Resp: 17  18   Temp: 97.9 F (36.6 C)  98.4 F (36.9 C) 97.8 F (36.6 C)  TempSrc: Oral  Oral Oral  SpO2: 97%  99% 100%  Weight:      Height:        Intake/Output Summary (Last 24 hours) at 09/02/2017 1531 Last data filed at 09/02/2017 0954 Gross per 24 hour  Intake 2206.25 ml  Output 2 ml  Net 2204.25 ml   Filed Weights   08/31/17 1534 09/01/17 0246  Weight: 82.6 kg (182 lb) 82.1 kg (181 lb 1.6 oz)    Examination:  General exam: Appears calm and comfortable  Respiratory system: Clear to auscultation. Respiratory effort normal. Cardiovascular system: S1 & S2 heard, RRR. Gastrointestinal system: Abdomen is nondistended, soft and nontender. No organomegaly or masses felt. Normal bowel sounds heard. Central nervous system: Alert and oriented. No focal neurological deficits. Extremities: Symmetric 5 x 5 power. Skin: No rashes, lesions  Psychiatry: Judgement and insight appear normal. Mood & affect appropriate.   Data Reviewed: I have personally reviewed following labs and imaging studies  CBC: Recent Labs  Lab 08/31/17 1733 09/01/17 0458  WBC 15.5* 12.8*  NEUTROABS 11.5*  --   HGB 12.6 10.1*  HCT 37.4 30.1*  MCV 87.8 88.5  PLT 316 062   Basic Metabolic Panel: Recent Labs  Lab 08/31/17 1733 09/01/17 0458  NA 133* 137  K 3.6 3.8  CL 94* 102  CO2 25 24  GLUCOSE 138* 153*  BUN 13 14  CREATININE 1.43* 1.36*  CALCIUM 10.1 9.0   GFR: Estimated Creatinine Clearance: 48.6 mL/min (A) (by C-G formula based on SCr of 1.36 mg/dL (H)). Liver Function Tests: Recent Labs  Lab 08/31/17 1733 09/01/17 0458  AST 93* 44*  ALT 72* 47  ALKPHOS 170* 123  BILITOT 2.7* 1.6*  PROT 9.6* 7.4  ALBUMIN 3.8 2.8*   No results for input(s): LIPASE, AMYLASE in the last 168 hours. No results for input(s): AMMONIA in the last  168 hours. Coagulation Profile: No results for input(s): INR, PROTIME in the last 168 hours. Cardiac Enzymes: Recent Labs  Lab 08/31/17 1733 08/31/17 2259 09/01/17 0458 09/01/17 1210  TROPONINI 0.03* 0.03* <0.03 <0.03   BNP (last 3 results) No results for input(s): PROBNP in the last 8760 hours. HbA1C: No results for input(s): HGBA1C in the last 72 hours. CBG: No results for input(s): GLUCAP in the last 168 hours. Lipid Profile: No results for input(s): CHOL, HDL, LDLCALC, TRIG, CHOLHDL, LDLDIRECT in the last 72 hours. Thyroid Function Tests: No results for input(s): TSH, T4TOTAL, FREET4, T3FREE, THYROIDAB in the last 72 hours. Anemia Panel: No results for input(s): VITAMINB12, FOLATE, FERRITIN, TIBC, IRON, RETICCTPCT in the last 72 hours. Sepsis Labs: Recent Labs  Lab 08/31/17 1735 08/31/17 1747 08/31/17 1901 08/31/17 2259  PROCALCITON  --   --   --  0.85  LATICACIDVEN 2.12* 2.36* 1.56  --     Recent Results (from the  past 240 hour(s))  Blood Culture (routine x 2)     Status: None (Preliminary result)   Collection Time: 08/31/17  5:33 PM  Result Value Ref Range Status   Specimen Description   Final    BLOOD LEFT ARM Performed at [facility] [address] [place], [place] [address]    Special Requests   Final    BOTTLES DRAWN AEROBIC AND ANAEROBIC Blood Culture adequate volume Performed at [facility] [address], [place], [place] [address]    Culture   Final    NO GROWTH 2 DAYS Performed at [facility] [address] [place], [place] [address]    Report Status PENDING  Incomplete  Urine culture     Status: Abnormal (Preliminary result)   Collection Time: 08/31/17  7:19 PM  Result Value Ref Range Status   Specimen Description   Final    URINE, CLEAN CATCH Performed at [facility], [place] [address], [place], [place] [address]    Special Requests   Final    NONE Performed at [facility], [place] [address] [place], [place] [address]    Culture >=100,000 COLONIES/mL ESCHERICHIA COLI (A)  Final   Report Status PENDING  Incomplete  Respiratory Panel by PCR     Status: None   Collection Time: 08/31/17  7:24 PM  Result Value Ref Range Status   Adenovirus NOT DETECTED NOT DETECTED Final   Coronavirus 229E NOT DETECTED NOT DETECTED Final   Coronavirus HKU1 NOT DETECTED NOT DETECTED Final   Coronavirus NL63 NOT DETECTED NOT DETECTED Final   Coronavirus OC43 NOT DETECTED NOT DETECTED Final   Metapneumovirus NOT DETECTED NOT DETECTED Final   Rhinovirus / Enterovirus NOT DETECTED NOT DETECTED Final   Influenza A NOT DETECTED NOT DETECTED Final   Influenza B NOT DETECTED NOT DETECTED Final   Parainfluenza Virus 1 NOT DETECTED NOT DETECTED Final   Parainfluenza Virus 2 NOT DETECTED NOT DETECTED Final   Parainfluenza Virus 3 NOT DETECTED NOT DETECTED Final   Parainfluenza Virus 4 NOT DETECTED NOT DETECTED Final   Respiratory Syncytial Virus NOT DETECTED NOT DETECTED Final   Bordetella pertussis NOT DETECTED NOT DETECTED Final   Chlamydophila pneumoniae NOT DETECTED NOT DETECTED Final   Mycoplasma pneumoniae NOT DETECTED NOT DETECTED Final    Comment: Performed at [facility], [place] [address], [place], [place] [address]  Blood Culture (routine x 2)     Status: None (Preliminary result)   Collection Time: 08/31/17 11:06 PM  Result Value Ref Range Status   Specimen Description   Final    BLOOD LEFT WRIST Performed at [facility] [address] [place], [place] [address]    Special Requests   Final    BOTTLES DRAWN AEROBIC AND ANAEROBIC Blood Culture adequate volume Performed at [facility] [address] [place] [address] [place], [place] [address]    Culture   Final    NO GROWTH 1 DAY Performed at [facility], [place] [address], [place],  [address]    Report Status PENDING  Incomplete     Radiology  Studies: Dg Chest 2 View  Result Date: 08/31/2017 CLINICAL DATA:  Chest pain EXAM: CHEST - 2 VIEW COMPARISON:  July 02, 2017 FINDINGS: The subcutaneous implantable defibrillator is again noted with lead position unchanged. Heart is borderline enlarged with pulmonary vascularity within normal limits. No edema or consolidation. No pneumothorax. No bone  lesions. IMPRESSION: Stable cardiac prominence. No edema or consolidation. Stable positioning of subcutaneous implantable defibrillator lead. Electronically Signed   By: Lowella Grip III M.D.   On: 08/31/2017 17:30   Ct Angio Chest Pe W And/or Wo Contrast  Result Date: 08/31/2017 CLINICAL DATA:  52 year old female with intermittent chest pain since last night. EXAM: CT ANGIOGRAPHY CHEST WITH CONTRAST TECHNIQUE: Multidetector CT imaging of the chest was performed using the standard protocol during bolus administration of intravenous contrast. Multiplanar CT image reconstructions and MIPs were obtained to evaluate the vascular anatomy. CONTRAST:  25m ISOVUE-370 IOPAMIDOL (ISOVUE-370) INJECTION 76% COMPARISON:  CTA chest abdomen and pelvis 07/02/2017 FINDINGS: Cardiovascular: Good contrast bolus timing in the pulmonary arterial tree. Mild respiratory motion, moderate in both lower lobes. Subsequently, the distal bilateral lower lobe branches are not well evaluated. But otherwise there is no focal filling defect identified in the pulmonary arteries to suggest acute pulmonary embolism. Stable mild cardiomegaly. No pericardial effusion. Negative thoracic and upper abdominal aorta aside from mild soft atherosclerosis. No definite calcified coronary artery atherosclerosis. Mediastinum/Nodes: Stable and negative. Lungs/Pleura: Lower lung volumes. Atelectatic changes to the hilar airways, greater on the left. The central airways are patent. Mild dependent atelectasis in both lungs. No pleural effusion or other abnormal pulmonary opacity. Upper Abdomen: The visible  upper abdomen is stable and negative. Musculoskeletal: Small intramuscular lipoma in the right posterior shoulder musculature incidentally noted and benign (series 4, image 11). Left axillary or lateral chest wall AICD generator device with a single subcutaneous left lateral and central chest wall lead is stable. No acute osseous abnormality identified. Review of the MIP images confirms the above findings. IMPRESSION: 1. No acute pulmonary embolus identified. The distal bilateral lower lobe branches are obscured by motion. 2. Lower lung volumes with atelectasis. No other acute pulmonary process. 3. Stable cardiomegaly.  No pericardial effusion. Electronically Signed   By: HGenevie AnnM.D.   On: 08/31/2017 20:38    Scheduled Meds: . colchicine  0.6 mg Oral BID  . enoxaparin (LOVENOX) injection  40 mg Subcutaneous Q24H  . isosorbide mononitrate  30 mg Oral Daily  . pantoprazole  40 mg Oral Daily  . [START ON 09/03/2017] predniSONE  40 mg Oral Q breakfast  . zolpidem  5 mg Oral QHS   Continuous Infusions: . sodium chloride 75 mL/hr at 09/02/17 0007  . cefTRIAXone (ROCEPHIN)  IV 1 g (09/02/17 1325)     LOS: 2 days   SMarylu Lund MD [facility] Pager [phone] If 7PM-7AM, please contact night-coverage [email or web address] Password [facility]3/27/2019, 3:31 PM

## 2017-09-02 NOTE — Progress Notes (Signed)
Occupational Therapy Treatment Patient Details Name: Kathy Bruce MRN: [id number] DOB: 09/29/1965 Today's Date: 09/02/2017    History of present illness pt was admitted for SIRS/sepsis.  PMH:  CHF, ICD, gout, OA   OT comments  tx limited today by pain.  Worked with AE from bed level, which I issued to her.  Still need to further assess DME needs   Follow Up Recommendations  Home health OT    Equipment Recommendations  (to be further assessed)    Recommendations for Other Services      Precautions / Restrictions Precautions Precautions: Fall Restrictions Weight Bearing Restrictions: No       Mobility Bed Mobility               General bed mobility comments: NT  Transfers                 General transfer comment: NT due to pain    Balance                                           ADL either performed or assessed with clinical judgement   ADL                                         General ADL Comments: worked with AE from a bed level:  had checked on pt twice and pain level still high.  Educated on washmitt and long sponge. Pt feels R hand is affected by gout. She did not want to try washmitt at this time.  Used reacher to doff sock and simulate donning pants.  Also brought built up foam for R hand (non dominant) to hold fork so she can cut food herself.     Vision       Perception     Praxis      Cognition Arousal/Alertness: Awake/alert Behavior During Therapy: WFL for tasks assessed/performed Overall Cognitive Status: Within Functional Limits for tasks assessed                                          Exercises     Shoulder Instructions       General Comments      Pertinent Vitals/ Pain       Pain Assessment: Faces Faces Pain Scale: Hurts whole lot Pain Location: RUE, both knees, feet Pain Descriptors / Indicators: Aching Pain Intervention(s): Limited activity within  patient's tolerance;Monitored during session;Premedicated before session  Home Living                                          Prior Functioning/Environment              Frequency  Min 2X/week        Progress Toward Goals  OT Goals(current goals can now be found in the care plan section)  Progress towards OT goals: Progressing toward goals(slowly due to pain)     Plan      Co-evaluation                 AM-PAC  PT "6 Clicks" Daily Activity     Outcome Measure   Help from another person eating meals?: A Little Help from another person taking care of personal grooming?: A Little Help from another person toileting, which includes using toliet, bedpan, or urinal?: A Lot Help from another person bathing (including washing, rinsing, drying)?: A Lot Help from another person to put on and taking off regular upper body clothing?: A Little Help from another person to put on and taking off regular lower body clothing?: A Lot 6 Click Score: 15    End of Session    OT Visit Diagnosis: Muscle weakness (generalized) (M62.81)   Activity Tolerance Patient limited by pain   Patient Left in bed;with call bell/phone within reach;with family/visitor present   Nurse Communication          Time: 0354-6568 OT Time Calculation (min): 8 min  Charges: OT General Charges $OT Visit: 1 Visit OT Treatments $Self Care/Home Management : 8-22 mins  Lesle Chris, OTR/L [phone] 09/02/2017   [place] 09/02/2017, 9:25 AM

## 2017-09-03 DIAGNOSIS — Z95810 Presence of automatic (implantable) cardiac defibrillator: Secondary | ICD-10-CM

## 2017-09-03 LAB — URINE CULTURE: Culture: >=100000 — AB

## 2017-09-03 LAB — COMPREHENSIVE METABOLIC PANEL
ALBUMIN: 2.7 g/dL — AB (ref 3.5–5.0)
ALT: 62 U/L — ABNORMAL HIGH (ref 14–54)
ANION GAP: 10 (ref 5–15)
AST: 74 U/L — ABNORMAL HIGH (ref 15–41)
Alkaline Phosphatase: 92 U/L (ref 38–126)
BUN: 20 mg/dL (ref 6–20)
CO2: 25 mmol/L (ref 22–32)
Calcium: 9 mg/dL (ref 8.9–10.3)
Chloride: 108 mmol/L (ref 101–111)
Creatinine, Ser: 1.33 mg/dL — ABNORMAL HIGH (ref 0.44–1.00)
GFR calc Af Amer: 53 mL/min — ABNORMAL LOW (ref >60)
GFR calc non Af Amer: 45 mL/min — ABNORMAL LOW (ref >60)
GLUCOSE: 140 mg/dL — AB (ref 65–99)
POTASSIUM: 3.6 mmol/L (ref 3.5–5.1)
SODIUM: 143 mmol/L (ref 135–145)
Total Bilirubin: 0.2 mg/dL — ABNORMAL LOW (ref 0.3–1.2)
Total Protein: 7 g/dL (ref 6.5–8.1)

## 2017-09-03 LAB — CBC
HCT: 27.7 % — ABNORMAL LOW (ref 36.0–46.0)
HEMOGLOBIN: 9 g/dL — AB (ref 12.0–15.0)
MCH: 29.6 pg (ref 26.0–34.0)
MCHC: 32.5 g/dL (ref 30.0–36.0)
MCV: 91.1 fL (ref 78.0–100.0)
Platelets: 408 10*3/uL — ABNORMAL HIGH (ref 150–400)
RBC: 3.04 MIL/uL — ABNORMAL LOW (ref 3.87–5.11)
RDW: 14.3 % (ref 11.5–15.5)
WBC: 10.2 10*3/uL (ref 4.0–10.5)

## 2017-09-03 MED ORDER — SODIUM CHLORIDE 0.9 % IV SOLN
INTRAVENOUS | Status: AC
  Administered 2017-09-03: 10:00:00 via INTRAVENOUS

## 2017-09-03 MED ORDER — METHYLPREDNISOLONE SODIUM SUCC 40 MG IJ SOLR
40.0000 mg | Freq: Once | INTRAMUSCULAR | Status: AC
  Administered 2017-09-03: 40 mg via INTRAVENOUS
  Filled 2017-09-03: qty 1

## 2017-09-03 MED ORDER — CARVEDILOL 12.5 MG PO TABS
12.5000 mg | ORAL_TABLET | Freq: Two times a day (BID) | ORAL | Status: DC
  Administered 2017-09-03 – 2017-09-06 (×6): 12.5 mg via ORAL
  Filled 2017-09-03 (×6): qty 1

## 2017-09-03 MED ORDER — HYDROMORPHONE HCL 1 MG/ML IJ SOLN
0.5000 mg | INTRAMUSCULAR | Status: DC | PRN
  Administered 2017-09-03 – 2017-09-06 (×9): 0.5 mg via INTRAVENOUS
  Filled 2017-09-03 (×9): qty 0.5

## 2017-09-03 MED ORDER — ALLOPURINOL 100 MG PO TABS
100.0000 mg | ORAL_TABLET | Freq: Two times a day (BID) | ORAL | Status: DC
  Administered 2017-09-03 – 2017-09-06 (×7): 100 mg via ORAL
  Filled 2017-09-03 (×7): qty 1

## 2017-09-03 MED ORDER — AMOXICILLIN 250 MG PO CAPS
500.0000 mg | ORAL_CAPSULE | Freq: Three times a day (TID) | ORAL | Status: DC
  Administered 2017-09-03 – 2017-09-06 (×10): 500 mg via ORAL
  Filled 2017-09-03 (×10): qty 2

## 2017-09-03 NOTE — Plan of Care (Signed)
?  Problem: Elimination: ?Goal: Will not experience complications related to urinary retention ?Outcome: Progressing ?  ?

## 2017-09-03 NOTE — Progress Notes (Signed)
Occupational Therapy Treatment Patient Details Name: Katrinia Straker MRN: [id number] DOB: 1966-02-18 Today's Date: 09/03/2017    History of present illness Pt is a 52 year old feamle admitted for SIRS/sepsis.  PMH:  CHF, ICD, gout, OA   OT comments  Slow progress.  Pt minimizing movement due to pain.  RUE hand ROM improving. Encouraged use of hand and used AE to incorporate and use this  Follow Up Recommendations  Home health OT    Equipment Recommendations  3 in 1 bedside commode    Recommendations for Other Services      Precautions / Restrictions Precautions Precautions: Fall Restrictions Weight Bearing Restrictions: No       Mobility Bed Mobility                  Transfers                      Balance                                           ADL either performed or assessed with clinical judgement   ADL       Grooming: Set up;Bed level using bil hands   Upper Body Bathing: Minimal assistance;Bed level using long sponge                             General ADL Comments: pt now has increased movement in RUE (as > 1/2 ROM) and can hold built up foam and long sponge.  Showed her how to use built up foam to hold fork to cut her own food.  Pt is reluctant to move due to pain in feet.       Vision       Perception     Praxis      Cognition Arousal/Alertness: Awake/alert Behavior During Therapy: WFL for tasks assessed/performed Overall Cognitive Status: Within Functional Limits for tasks assessed                                          Exercises     Shoulder Instructions       General Comments brought photo to show her how tub bench fits into tub.  She has this but has not used it.  Pt doesn't need a lot of assistance to move, but she minimizes movement due to foot pain. She states she will get up in chair later, and she has been getting up daily. She spends from 3-11 by herself when her  fiance works.  She would benefit from 3:1 next to bed or chair when she is alone    Pertinent Vitals/ Pain       Faces Pain Scale: Hurts even more Pain Location: RUE, both knees, feet Pain Descriptors / Indicators: Aching Pain Intervention(s): Limited activity within patient's tolerance;Monitored during session;Repositioned;Patient requesting pain meds-RN notified  Home Living                                          Prior Functioning/Environment              Frequency  Progress Toward Goals  OT Goals(current goals can now be found in the care plan section)  Progress towards OT goals: (slow progress)     Plan      Co-evaluation                 AM-PAC PT "6 Clicks" Daily Activity     Outcome Measure   Help from another person eating meals?: A Little Help from another person taking care of personal grooming?: A Little Help from another person toileting, which includes using toliet, bedpan, or urinal?: A Lot Help from another person bathing (including washing, rinsing, drying)?: A Little Help from another person to put on and taking off regular upper body clothing?: A Little Help from another person to put on and taking off regular lower body clothing?: A Lot 6 Click Score: 16    End of Session    OT Visit Diagnosis: Muscle weakness (generalized) (M62.81)   Activity Tolerance Patient limited by pain   Patient Left in bed;with call bell/phone within reach;with family/visitor present   Nurse Communication          Time: 7169-6789 OT Time Calculation (min): 21 min  Charges: OT General Charges $OT Visit: 1 Visit OT Treatments $Therapeutic Activity: 8-22 mins  Lesle Chris, OTR/L [phone] 09/03/2017   [place] 09/03/2017, 4:15 PM

## 2017-09-03 NOTE — Progress Notes (Signed)
PROGRESS NOTE    Kathy Bruce  CZY:[id number] DOB: 02-08-1966 DOA: 08/31/2017 PCP: Hayden Rasmussen, MD    Brief Narrative:  52 year old female with past medical history significant for systolic CHF, last ejection fraction 30-35%, status post ICD, coronary artery disease, gout, arthritis on chronic steroid use who presents to the emergency department with complaints of generalized body ache and generalized weakness.  Patient was found to be febrile up to 103 F.  Patient was also having urinary frequency.  Patient's UA was suggestive of urinary tract infection and she was admitted for further management .  Assessment & Plan:   Principal Problem:   SIRS (systemic inflammatory response syndrome) (HCC) Active Problems:   ICD (implantable cardioverter-defibrillator) in place, BSCi, 2013   Chest pain   Coronary artery disease involving native heart with angina pectoris (HCC)   Gout   Renal insufficiency   HTN (hypertension)   Acute lower UTI  SIRS/possible sepsis: Was found to be febrile up to 103 Fahrenheit with tachycardia and tachypnea on presentation.  Also presented with leukocytosis and elevated lactic acid.Both have improved. Patient was started on IV fluids and broad-spectrum antibiotics. Clinically seems improved Urine cx pos for gm neg rods.  Patient has been continued on Rocephin monotherapy now afebrile  Urinary tract infection with sepsis present on admit: UA suggestive of this.  Urine cx pos for gm neg in rods, now showing ecoli.  Patient now afebrile per above  Chest pain: Patient complained of chest pain on presentation.  Mild elevation of troponin but has normalized now.  EKG was not suggestive of any acute ischemic changes.    Patient is reporting chest pain, however pain extends well beyond her chest.  Patient has known chronic gout pains involving diffuse parts of her body including her chest, bilateral upper extremities, bilateral feet and ankles.  Per below,  presenting ESR was noted to be 120 and patient had been on daily prednisone for several years.  Patient has been referred to local rheumatologist.  Have continue patient on high-dose IV steroids as per below.  Continue analgesics as tolerated  CKD: Baseline creatinine ranges from 1.2-1.3.  Creatinine is close to baseline.  We will continue patient on IV fluids at 75 cc/h for 5 hours.  Congestive heart failure:last EF 30-35%,s/p ICD.  On Lasix 40 mg at home which is on hold.  Appears to be compensated at this time.  Given concerns for possible gout flare, will start patient on gentle IV fluids for 12 hours and reassess.  History of coronary artery disease: Continue her home meds.  Patient does report chest pains which are highly reproducible, consistent with concerns for arthritic/gouty pain per below  History of gout/arthritis: Follows with rheumatologist. Had been on prensione 10 mg daily for several years.  Continued pain medications.  Currently on colchicine and prednisone.  PT/OT consult requested. Recommendations for home health noted She was started on prednisone 60 mg daily. Pain still largely unimproved. ESR of 120 with markedly elevated CRP, reviewed Little relief following 1 dose of IV Solu-Medrol.  Will resume IV steroids. Patient is continued on home colchicine.  Will add allopurinol  Elevated liver enzymes: Had improved. Continue to monitor  Hypertension: Currently blood pressure on lower side. She is on Coreg, clonidine, lasix, losartan at home which were held.  Continued on gentle IV fluids. BP improved  DVT prophylaxis: Lovenox subQ Code Status: Full Family Communication: Pt in room, family not at bedside Disposition Plan: Uncertain at this time  Consultants:     Procedures:     Antimicrobials: Anti-infectives (From admission, onward)   Start     Dose/Rate Route Frequency Ordered Stop   09/03/17 1400  amoxicillin (AMOXIL) capsule 500 mg     500 mg Oral Every  8 hours 09/03/17 0950 09/07/17 1359   09/02/17 1400  cefTRIAXone (ROCEPHIN) 1 g in sodium chloride 0.9 % 100 mL IVPB  Status:  Discontinued     1 g 200 mL/hr over 30 Minutes Intravenous Every 24 hours 09/02/17 0941 09/03/17 0950   09/01/17 1800  vancomycin (VANCOCIN) IVPB 1000 mg/200 mL premix  Status:  Discontinued     1,000 mg 200 mL/hr over 60 Minutes Intravenous Every 36 hours 08/31/17 2228 09/02/17 0941   08/31/17 2230  piperacillin-tazobactam (ZOSYN) IVPB 3.375 g  Status:  Discontinued     3.375 g 12.5 mL/hr over 240 Minutes Intravenous Every 8 hours 08/31/17 2224 09/02/17 0942   08/31/17 1730  piperacillin-tazobactam (ZOSYN) IVPB 3.375 g     3.375 g 100 mL/hr over 30 Minutes Intravenous  Once 08/31/17 1717 08/31/17 1845   08/31/17 1730  vancomycin (VANCOCIN) IVPB 1000 mg/200 mL premix     1,000 mg 200 mL/hr over 60 Minutes Intravenous  Once 08/31/17 1717 08/31/17 2018      Subjective: Continued with diffuse pain, now involving reproducible chest pain as well as bilateral ankles and feet pain  Objective: Vitals:   09/02/17 2043 09/03/17 0403 09/03/17 1000 09/03/17 1308  BP: 140/85 (!) 161/95 (!) 173/97 (!) 147/82  Pulse: 88 96 88 96  Resp: 20 20 18 18   Temp: 98.7 F (37.1 C) 98.4 F (36.9 C) 98.6 F (37 C) 98.2 F (36.8 C)  TempSrc: Oral Oral Oral Oral  SpO2: 100% 99% 100% 100%  Weight:      Height:        Intake/Output Summary (Last 24 hours) at 09/03/2017 1432 Last data filed at 09/03/2017 1308 Gross per 24 hour  Intake 2140 ml  Output -  Net 2140 ml   Filed Weights   08/31/17 1534 09/01/17 0246  Weight: 82.6 kg (182 lb) 82.1 kg (181 lb 1.6 oz)    Examination: General exam: Awake, laying in bed, in nad Respiratory system: Normal respiratory effort, no wheezing Cardiovascular system: regular rate, s1, s2 Gastrointestinal system: Soft, nondistended, positive BS Central nervous system: CN2-12 grossly intact, strength intact Extremities: Perfused, no  clubbing Skin: Normal skin turgor, no notable skin lesions seen Psychiatry: Mood normal // no visual hallucinations    Data Reviewed: I have personally reviewed following labs and imaging studies  CBC: Recent Labs  Lab 08/31/17 1733 09/01/17 0458 09/03/17 0408  WBC 15.5* 12.8* 10.2  NEUTROABS 11.5*  --   --   HGB 12.6 10.1* 9.0*  HCT 37.4 30.1* 27.7*  MCV 87.8 88.5 91.1  PLT 316 276 480*   Basic Metabolic Panel: Recent Labs  Lab 08/31/17 1733 09/01/17 0458 09/03/17 0408  NA 133* 137 143  K 3.6 3.8 3.6  CL 94* 102 108  CO2 25 24 25   GLUCOSE 138* 153* 140*  BUN 13 14 20   CREATININE 1.43* 1.36* 1.33*  CALCIUM 10.1 9.0 9.0   GFR: Estimated Creatinine Clearance: 49.7 mL/min (A) (by C-G formula based on SCr of 1.33 mg/dL (H)). Liver Function Tests: Recent Labs  Lab 08/31/17 1733 09/01/17 0458 09/03/17 0408  AST 93* 44* 74*  ALT 72* 47 62*  ALKPHOS 170* 123 92  BILITOT 2.7* 1.6* 0.2*  PROT  9.6* 7.4 7.0  ALBUMIN 3.8 2.8* 2.7*   No results for input(s): LIPASE, AMYLASE in the last 168 hours. No results for input(s): AMMONIA in the last 168 hours. Coagulation Profile: No results for input(s): INR, PROTIME in the last 168 hours. Cardiac Enzymes: Recent Labs  Lab 08/31/17 1733 08/31/17 2259 09/01/17 0458 09/01/17 1210  TROPONINI 0.03* 0.03* <0.03 <0.03   BNP (last 3 results) No results for input(s): PROBNP in the last 8760 hours. HbA1C: No results for input(s): HGBA1C in the last 72 hours. CBG: No results for input(s): GLUCAP in the last 168 hours. Lipid Profile: No results for input(s): CHOL, HDL, LDLCALC, TRIG, CHOLHDL, LDLDIRECT in the last 72 hours. Thyroid Function Tests: No results for input(s): TSH, T4TOTAL, FREET4, T3FREE, THYROIDAB in the last 72 hours. Anemia Panel: No results for input(s): VITAMINB12, FOLATE, FERRITIN, TIBC, IRON, RETICCTPCT in the last 72 hours. Sepsis Labs: Recent Labs  Lab 08/31/17 1735 08/31/17 1747 08/31/17 1901  08/31/17 2259  PROCALCITON  --   --   --  0.85  LATICACIDVEN 2.12* 2.36* 1.56  --     Recent Results (from the past 240 hour(s))  Blood Culture (routine x 2)     Status: None (Preliminary result)   Collection Time: 08/31/17  5:33 PM  Result Value Ref Range Status   Specimen Description   Final    BLOOD LEFT ARM Performed at [facility] [address], [place], [place] [address]    Special Requests   Final    BOTTLES DRAWN AEROBIC AND ANAEROBIC Blood Culture adequate volume Performed at [facility] [address], [place], [place] [address]    Culture   Final    NO GROWTH 3 DAYS Performed at [facility], [place] [address], [place], [place] [address]    Report Status PENDING  Incomplete  Urine culture     Status: Abnormal   Collection Time: 08/31/17  7:19 PM  Result Value Ref Range Status   Specimen Description   Final    URINE, CLEAN CATCH Performed at [facility], [place] [address] [place], [place] [address]    Special Requests   Final    NONE Performed at [facility], [place] [address] [place],  [address]    Culture >=100,000 COLONIES/mL ESCHERICHIA COLI (A)  Final   Report Status 09/03/2017 FINAL  Final   Organism ID, Bacteria ESCHERICHIA COLI (A)  Final      Susceptibility   Escherichia coli - MIC*    AMPICILLIN <=2 SENSITIVE Sensitive     CEFAZOLIN <=4 SENSITIVE Sensitive     CEFTRIAXONE <=1 SENSITIVE Sensitive     CIPROFLOXACIN <=0.25 SENSITIVE Sensitive     GENTAMICIN <=1 SENSITIVE Sensitive     IMIPENEM <=0.25 SENSITIVE Sensitive     NITROFURANTOIN <=16 SENSITIVE Sensitive     TRIMETH/SULFA <=20 SENSITIVE Sensitive     AMPICILLIN/SULBACTAM <=2 SENSITIVE Sensitive     PIP/TAZO <=4 SENSITIVE Sensitive     Extended ESBL NEGATIVE Sensitive     * >=100,000 COLONIES/mL ESCHERICHIA COLI  Respiratory Panel by PCR     Status: None   Collection Time: 08/31/17  7:24 PM  Result  Value Ref Range Status   Adenovirus NOT DETECTED NOT DETECTED Final   Coronavirus 229E NOT DETECTED NOT DETECTED Final   Coronavirus HKU1 NOT DETECTED NOT DETECTED Final   Coronavirus NL63 NOT DETECTED NOT DETECTED Final   Coronavirus OC43 NOT DETECTED NOT DETECTED  Final   Metapneumovirus NOT DETECTED NOT DETECTED Final   Rhinovirus / Enterovirus NOT DETECTED NOT DETECTED Final   Influenza A NOT DETECTED NOT DETECTED Final   Influenza B NOT DETECTED NOT DETECTED Final   Parainfluenza Virus 1 NOT DETECTED NOT DETECTED Final   Parainfluenza Virus 2 NOT DETECTED NOT DETECTED Final   Parainfluenza Virus 3 NOT DETECTED NOT DETECTED Final   Parainfluenza Virus 4 NOT DETECTED NOT DETECTED Final   Respiratory Syncytial Virus NOT DETECTED NOT DETECTED Final   Bordetella pertussis NOT DETECTED NOT DETECTED Final   Chlamydophila pneumoniae NOT DETECTED NOT DETECTED Final   Mycoplasma pneumoniae NOT DETECTED NOT DETECTED Final    Comment: Performed at [facility], [place] [address], [place], [place] [address]  Blood Culture (routine x 2)     Status: None (Preliminary result)   Collection Time: 08/31/17 11:06 PM  Result Value Ref Range Status   Specimen Description   Final    BLOOD LEFT WRIST Performed at [facility] [address], [place], [place] [address]    Special Requests   Final    BOTTLES DRAWN AEROBIC AND ANAEROBIC Blood Culture adequate volume Performed at [facility] [address] [place], [place] [address]    Culture   Final    NO GROWTH 2 DAYS Performed at [facility] [address], [place], [place] [address]    Report Status PENDING  Incomplete     Radiology Studies: No results found.  Scheduled Meds: . allopurinol  100 mg Oral BID  . amoxicillin  500 mg Oral Q8H  . carvedilol  12.5 mg Oral BID WC  . colchicine  0.6 mg Oral BID  . enoxaparin (LOVENOX) injection  40 mg Subcutaneous Q24H  . isosorbide mononitrate   30 mg Oral Daily  . pantoprazole  40 mg Oral Daily  . predniSONE  40 mg Oral Q breakfast  . zolpidem  5 mg Oral QHS   Continuous Infusions: . sodium chloride 75 mL/hr at 09/03/17 0126  . sodium chloride 75 mL/hr at 09/03/17 1027     LOS: 3 days   Marylu Lund, MD [facility] Pager [phone]  If 7PM-7AM, please contact night-coverage [email or web address] Password [facility] 09/03/2017, 2:32 PM

## 2017-09-04 DIAGNOSIS — N390 Urinary tract infection, site not specified: Secondary | ICD-10-CM

## 2017-09-04 DIAGNOSIS — N289 Disorder of kidney and ureter, unspecified: Secondary | ICD-10-CM

## 2017-09-04 DIAGNOSIS — M10011 Idiopathic gout, right shoulder: Secondary | ICD-10-CM

## 2017-09-04 LAB — CBC
HCT: 28.2 % — ABNORMAL LOW (ref 36.0–46.0)
HEMOGLOBIN: 9.1 g/dL — AB (ref 12.0–15.0)
MCH: 29.2 pg (ref 26.0–34.0)
MCHC: 32.3 g/dL (ref 30.0–36.0)
MCV: 90.4 fL (ref 78.0–100.0)
Platelets: 390 10*3/uL (ref 150–400)
RBC: 3.12 MIL/uL — ABNORMAL LOW (ref 3.87–5.11)
RDW: 14 % (ref 11.5–15.5)
WBC: 9 10*3/uL (ref 4.0–10.5)

## 2017-09-04 LAB — BASIC METABOLIC PANEL
ANION GAP: 9 (ref 5–15)
BUN: 12 mg/dL (ref 6–20)
CALCIUM: 9.1 mg/dL (ref 8.9–10.3)
CO2: 27 mmol/L (ref 22–32)
Chloride: 107 mmol/L (ref 101–111)
Creatinine, Ser: 1.14 mg/dL — ABNORMAL HIGH (ref 0.44–1.00)
GFR calc Af Amer: >60 mL/min (ref >60)
GFR, EST NON AFRICAN AMERICAN: 55 mL/min — AB (ref >60)
Glucose, Bld: 147 mg/dL — ABNORMAL HIGH (ref 65–99)
Potassium: 3.9 mmol/L (ref 3.5–5.1)
Sodium: 143 mmol/L (ref 135–145)

## 2017-09-04 MED ORDER — NAPROXEN 500 MG PO TABS
500.0000 mg | ORAL_TABLET | Freq: Two times a day (BID) | ORAL | Status: DC
  Administered 2017-09-04 – 2017-09-06 (×4): 500 mg via ORAL
  Filled 2017-09-04 (×4): qty 1

## 2017-09-04 MED ORDER — METHYLPREDNISOLONE SODIUM SUCC 40 MG IJ SOLR
40.0000 mg | Freq: Once | INTRAMUSCULAR | Status: AC
  Administered 2017-09-04: 40 mg via INTRAVENOUS
  Filled 2017-09-04: qty 1

## 2017-09-04 NOTE — [place] Notes (Signed)
PROGRESS NOTE    Kathy Bruce  XTK:[id number] DOB: [birth year] DOA: 08/31/2017 PCP: Hayden Rasmussen, MD    Brief Narrative:  52 year old female with past medical history significant for systolic CHF, last ejection fraction 30-35%, status post ICD, coronary artery disease, gout, arthritis on chronic steroid use who presents to the emergency department with complaints of generalized body ache and generalized weakness.  Patient was found to be febrile up to 103 F.  Patient was also having urinary frequency.  Patient's UA was suggestive of urinary tract infection and she was admitted for further management .  Assessment & Plan:   Principal Problem:   SIRS (systemic inflammatory response syndrome) (HCC) Active Problems:   ICD (implantable cardioverter-defibrillator) in place, [facility], 2013   Chest pain   Coronary artery disease involving native heart with angina pectoris (HCC)   Gout   Renal insufficiency   HTN (hypertension)   Acute lower UTI  SIRS/possible sepsis: Was found to be febrile up to 103 Fahrenheit with tachycardia and tachypnea on presentation.  Also presented with leukocytosis and elevated lactic acid.Both have improved. Patient was started on IV fluids and broad-spectrum antibiotics. Clinically seems improved since initial presentation Urine cx pos for ecoli.  Patient has been continued on Rocephin monotherapy now afebrile Now afebrile. Leukocytosis improving  Urinary tract infection with sepsis present on admit: UA suggestive of this.  Urine cx pos for gm neg in rods, now showing ecoli.  Patient now afebrile per above. Appears stable at present  Chest pain: Patient complained of chest pain on presentation.  Mild elevation of troponin but has normalized now.  EKG was not suggestive of any acute ischemic changes.    Patient is reporting chest pain, however pain extends well beyond her chest.  Patient has known chronic gout pains involving diffuse parts of her body including  her chest, bilateral upper extremities, bilateral feet and ankles.  Per below, presenting ESR was noted to be 120 and patient had been on daily prednisone for several years.  Patient has been referred to local rheumatologist, has not seen yet.  Have continue patient on high-dose IV steroids as per below. Patient reports previously taking daily naproxen that was stopped secondary to running out of med. Will resume naproxen  CKD: Baseline creatinine ranges from 1.2-1.3.  Creatinine is close to baseline. Given 12hrs of saline overnight. Improved  Congestive heart failure:last EF 30-35%,s/p ICD.  On Lasix 40 mg at home which is on hold.  Appears to be compensated at this time.  Given concerns for possible gout flare, will start patient on gentle IV fluids for 12 hours and reassess.  History of coronary artery disease: Continue her home meds.  Patient does report chest pains which are highly reproducible, consistent with concerns for arthritic/gouty pain as per below  History of gout/arthritis: Follows with rheumatologist. Had been on prensione 10 mg daily for several years.  Continued pain medications.  Pateint presently on colchicine and  IV steroids.  PT/OT consult requested. Recommendations for home health noted ESR of 120 with markedly elevated CRP at time of presentation, reviewed Continued on IV steroids. Patient is continued on home colchicine.  Added allopurinol. Will add naproxen  Elevated liver enzymes: Had improved. Continue to monitor for now  Hypertension: Currently blood pressure on lower side. She is on Coreg, clonidine, lasix, losartan at home which were held.  Patient was given gentle IV fluids. BP improved  DVT prophylaxis: Lovenox subQ Code Status: Full Family Communication: Pt in room, family  not at bedside Disposition Plan: Uncertain at this time  Consultants:     Procedures:     Antimicrobials: Anti-infectives (From admission, onward)   Start     Dose/Rate  Route Frequency Ordered Stop   09/03/17 1400  amoxicillin (AMOXIL) capsule 500 mg     500 mg Oral Every 8 hours 09/03/17 0950 09/07/17 1359   09/02/17 1400  cefTRIAXone (ROCEPHIN) 1 g in sodium chloride 0.9 % 100 mL IVPB  Status:  Discontinued     1 g 200 mL/hr over 30 Minutes Intravenous Every 24 hours 09/02/17 0941 09/03/17 0950   09/01/17 1800  vancomycin (VANCOCIN) IVPB 1000 mg/200 mL premix  Status:  Discontinued     1,000 mg 200 mL/hr over 60 Minutes Intravenous Every 36 hours 08/31/17 2228 09/02/17 0941   08/31/17 2230  piperacillin-tazobactam (ZOSYN) IVPB 3.375 g  Status:  Discontinued     3.375 g 12.5 mL/hr over 240 Minutes Intravenous Every 8 hours 08/31/17 2224 09/02/17 0942   08/31/17 1730  piperacillin-tazobactam (ZOSYN) IVPB 3.375 g     3.375 g 100 mL/hr over 30 Minutes Intravenous  Once 08/31/17 1717 08/31/17 1845   08/31/17 1730  vancomycin (VANCOCIN) IVPB 1000 mg/200 mL premix     1,000 mg 200 mL/hr over 60 Minutes Intravenous  Once 08/31/17 1717 08/31/17 2018      Subjective: Still complaining of diffuse joint pains  Objective: Vitals:   09/03/17 1000 09/03/17 1308 09/03/17 2046 09/04/17 0557  BP: (!) 173/97 (!) 147/82 137/89 131/69  Pulse: 88 96 87 74  Resp: 18 18 18 20   Temp: 98.6 F (37 C) 98.2 F (36.8 C) 98.4 F (36.9 C) 97.6 F (36.4 C)  TempSrc: Oral Oral Oral Oral  SpO2: 100% 100% 100% 95%  Weight:      Height:        Intake/Output Summary (Last 24 hours) at 09/04/2017 1318 Last data filed at 09/04/2017 0900 Gross per 24 hour  Intake 1856.25 ml  Output -  Net 1856.25 ml   Filed Weights   08/31/17 1534 09/01/17 0246  Weight: 82.6 kg (182 lb) 82.1 kg (181 lb 1.6 oz)    Examination: General exam: Conversant, in no acute distress Respiratory system: normal chest rise, clear, no audible wheezing Cardiovascular system: regular rhythm, s1-s2 Gastrointestinal system: Nondistended, nontender, pos BS Central nervous system: No seizures, no  tremors Extremities: No cyanosis, no joint deformities Skin: No rashes, no pallor Psychiatry: Affect normal // no auditory hallucinations   Data Reviewed: I have personally reviewed following labs and imaging studies  CBC: Recent Labs  Lab 08/31/17 1733 09/01/17 0458 09/03/17 0408 09/04/17 0357  WBC 15.5* 12.8* 10.2 9.0  NEUTROABS 11.5*  --   --   --   HGB 12.6 10.1* 9.0* 9.1*  HCT 37.4 30.1* 27.7* 28.2*  MCV 87.8 88.5 91.1 90.4  PLT 316 276 408* 680   Basic Metabolic Panel: Recent Labs  Lab 08/31/17 1733 09/01/17 0458 09/03/17 0408 09/04/17 0357  NA 133* 137 143 143  K 3.6 3.8 3.6 3.9  CL 94* 102 108 107  CO2 25 24 25 27   GLUCOSE 138* 153* 140* 147*  BUN 13 14 20 12   CREATININE 1.43* 1.36* 1.33* 1.14*  CALCIUM 10.1 9.0 9.0 9.1   GFR: Estimated Creatinine Clearance: 58 mL/min (A) (by C-G formula based on SCr of 1.14 mg/dL (H)). Liver Function Tests: Recent Labs  Lab 08/31/17 1733 09/01/17 0458 09/03/17 0408  AST 93* 44* 74*  ALT 72*  47 62*  ALKPHOS 170* 123 92  BILITOT 2.7* 1.6* 0.2*  PROT 9.6* 7.4 7.0  ALBUMIN 3.8 2.8* 2.7*   No results for input(s): LIPASE, AMYLASE in the last 168 hours. No results for input(s): AMMONIA in the last 168 hours. Coagulation Profile: No results for input(s): INR, PROTIME in the last 168 hours. Cardiac Enzymes: Recent Labs  Lab 08/31/17 1733 08/31/17 2259 09/01/17 0458 09/01/17 1210  TROPONINI 0.03* 0.03* <0.03 <0.03   BNP (last 3 results) No results for input(s): PROBNP in the last 8760 hours. HbA1C: No results for input(s): HGBA1C in the last 72 hours. CBG: No results for input(s): GLUCAP in the last 168 hours. Lipid Profile: No results for input(s): CHOL, HDL, LDLCALC, TRIG, CHOLHDL, LDLDIRECT in the last 72 hours. Thyroid Function Tests: No results for input(s): TSH, T4TOTAL, FREET4, T3FREE, THYROIDAB in the last 72 hours. Anemia Panel: No results for input(s): VITAMINB12, FOLATE, FERRITIN, TIBC, IRON,  RETICCTPCT in the last 72 hours. Sepsis Labs: Recent Labs  Lab 08/31/17 1735 08/31/17 1747 08/31/17 1901 08/31/17 2259  PROCALCITON  --   --   --  0.85  LATICACIDVEN 2.12* 2.36* 1.56  --     Recent Results (from the past 240 hour(s))  Blood Culture (routine x 2)     Status: None (Preliminary result)   Collection Time: 08/31/17  5:33 PM  Result Value Ref Range Status   Specimen Description   Final    BLOOD LEFT ARM Performed at [facility] [address], [place], [place] [address]    Special Requests   Final    BOTTLES DRAWN AEROBIC AND ANAEROBIC Blood Culture adequate volume Performed at [facility] [address], [place], [place] [address]    Culture   Final    NO GROWTH 3 DAYS Performed at [facility], [place] [address] [place], [place] [address]    Report Status PENDING  Incomplete  Urine culture     Status: Abnormal   Collection Time: 08/31/17  7:19 PM  Result Value Ref Range Status   Specimen Description   Final    URINE, CLEAN CATCH Performed at [facility], [place] [address], [place], [place] [address]    Special Requests   Final    NONE Performed at [facility], [place] [address], [place], [place] [address]    Culture >=100,000 COLONIES/mL ESCHERICHIA COLI (A)  Final   Report Status 09/03/2017 FINAL  Final   Organism ID, Bacteria ESCHERICHIA COLI (A)  Final      Susceptibility   Escherichia coli - MIC*    AMPICILLIN <=2 SENSITIVE Sensitive     CEFAZOLIN <=4 SENSITIVE Sensitive     CEFTRIAXONE <=1 SENSITIVE Sensitive     CIPROFLOXACIN <=0.25 SENSITIVE Sensitive     GENTAMICIN <=1 SENSITIVE Sensitive     IMIPENEM <=0.25 SENSITIVE Sensitive     NITROFURANTOIN <=16 SENSITIVE Sensitive     TRIMETH/SULFA <=20 SENSITIVE Sensitive     AMPICILLIN/SULBACTAM <=2 SENSITIVE Sensitive     PIP/TAZO <=4 SENSITIVE Sensitive     Extended ESBL NEGATIVE Sensitive     * >=100,000 COLONIES/mL  ESCHERICHIA COLI  Respiratory Panel by PCR     Status: None   Collection Time: 08/31/17  7:24 PM  Result Value Ref Range Status   Adenovirus NOT DETECTED NOT DETECTED Final   Coronavirus 229E NOT DETECTED NOT DETECTED Final   Coronavirus HKU1 NOT DETECTED NOT DETECTED Final   Coronavirus  NL63 NOT DETECTED NOT DETECTED Final   Coronavirus OC43 NOT DETECTED NOT DETECTED Final   Metapneumovirus NOT DETECTED NOT DETECTED Final   Rhinovirus / Enterovirus NOT DETECTED NOT DETECTED Final   Influenza A NOT DETECTED NOT DETECTED Final   Influenza B NOT DETECTED NOT DETECTED Final   Parainfluenza Virus 1 NOT DETECTED NOT DETECTED Final   Parainfluenza Virus 2 NOT DETECTED NOT DETECTED Final   Parainfluenza Virus 3 NOT DETECTED NOT DETECTED Final   Parainfluenza Virus 4 NOT DETECTED NOT DETECTED Final   Respiratory Syncytial Virus NOT DETECTED NOT DETECTED Final   Bordetella pertussis NOT DETECTED NOT DETECTED Final   Chlamydophila pneumoniae NOT DETECTED NOT DETECTED Final   Mycoplasma pneumoniae NOT DETECTED NOT DETECTED Final    Comment: Performed at [facility], [place] [address], [place], [place] [address]  Blood Culture (routine x 2)     Status: None (Preliminary result)   Collection Time: 08/31/17 11:06 PM  Result Value Ref Range Status   Specimen Description   Final    BLOOD LEFT WRIST Performed at [facility] [address] [place], [place] [address]    Special Requests   Final    BOTTLES DRAWN AEROBIC AND ANAEROBIC Blood Culture adequate volume Performed at [facility] [address] [place], [place] [address]    Culture   Final    NO GROWTH 2 DAYS Performed at [facility] [address] [place], [place] [address]    Report Status PENDING  Incomplete     Radiology Studies: No results found.  Scheduled Meds: . allopurinol  100 mg Oral BID  . amoxicillin  500 mg Oral Q8H  . carvedilol  12.5 mg Oral BID WC  .  colchicine  0.6 mg Oral BID  . enoxaparin (LOVENOX) injection  40 mg Subcutaneous Q24H  . isosorbide mononitrate  30 mg Oral Daily  . naproxen  500 mg Oral BID WC  . pantoprazole  40 mg Oral Daily  . predniSONE  40 mg Oral Q breakfast  . zolpidem  5 mg Oral QHS   Continuous Infusions: . sodium chloride 75 mL/hr at 09/03/17 2100     LOS: 4 days   Marylu Lund, MD [facility] Pager [phone]  If 7PM-7AM, please contact night-coverage [email or web address] Password [email or web address] 09/04/2017, 1:18 PM

## 2017-09-04 NOTE — [place] Notes (Signed)
PT Cancellation Note  Patient Details Name: Kathy Bruce MRN: [id number] DOB: April 21, 1966   Cancelled Treatment:    Reason Eval/Treat Not Completed: Pain limiting ability to participate.  Declined bed exercises as well so will try again next visit.   Ramond Dial 09/04/2017, 6:24 PM   Mee Hives, PT MS Acute Rehab Dept. Number: [place] and [place]

## 2017-09-04 NOTE — [place] Notes (Signed)
Referral for [facility] was given to [place] for HHRN/PT. Will continue to follow.

## 2017-09-05 DIAGNOSIS — I10 Essential (primary) hypertension: Secondary | ICD-10-CM

## 2017-09-05 LAB — CBC
HCT: 28.3 % — ABNORMAL LOW (ref 36.0–46.0)
HEMOGLOBIN: 9.1 g/dL — AB (ref 12.0–15.0)
MCH: 29.2 pg (ref 26.0–34.0)
MCHC: 32.2 g/dL (ref 30.0–36.0)
MCV: 90.7 fL (ref 78.0–100.0)
PLATELETS: 427 10*3/uL — AB (ref 150–400)
RBC: 3.12 MIL/uL — AB (ref 3.87–5.11)
RDW: 14.3 % (ref 11.5–15.5)
WBC: 10.3 10*3/uL (ref 4.0–10.5)

## 2017-09-05 LAB — BASIC METABOLIC PANEL
ANION GAP: 9 (ref 5–15)
BUN: 16 mg/dL (ref 6–20)
CALCIUM: 9.1 mg/dL (ref 8.9–10.3)
CHLORIDE: 105 mmol/L (ref 101–111)
CO2: 28 mmol/L (ref 22–32)
CREATININE: 1.2 mg/dL — AB (ref 0.44–1.00)
GFR calc non Af Amer: 51 mL/min — ABNORMAL LOW (ref >60)
GFR, EST AFRICAN AMERICAN: 60 mL/min — AB (ref >60)
Glucose, Bld: 99 mg/dL (ref 65–99)
Potassium: 4.1 mmol/L (ref 3.5–5.1)
SODIUM: 142 mmol/L (ref 135–145)

## 2017-09-05 LAB — CULTURE, BLOOD (ROUTINE X 2)
CULTURE: NO GROWTH
Special Requests: ADEQUATE

## 2017-09-05 MED ORDER — METHYLPREDNISOLONE SODIUM SUCC 40 MG IJ SOLR
40.0000 mg | Freq: Once | INTRAMUSCULAR | Status: AC
Start: 2017-09-05 — End: 2017-09-05
  Administered 2017-09-05: 40 mg via INTRAVENOUS
  Filled 2017-09-05: qty 1

## 2017-09-05 NOTE — Progress Notes (Signed)
PROGRESS NOTE    Kathy Bruce  ZGY:[id number] DOB: 12-Jun-1965 DOA: 08/31/2017 PCP: Hayden Rasmussen, MD    Brief Narrative:  52 year old female with past medical history significant for systolic CHF, last ejection fraction 30-35%, status post ICD, coronary artery disease, gout, arthritis on chronic steroid use who presents to the emergency department with complaints of generalized body ache and generalized weakness.  Patient was found to be febrile up to 103 F.  Patient was also having urinary frequency.  Patient's UA was suggestive of urinary tract infection and she was admitted for further management .  Assessment & Plan:   Principal Problem:   SIRS (systemic inflammatory response syndrome) (HCC) Active Problems:   ICD (implantable cardioverter-defibrillator) in place, [facility], 2013   Chest pain   Coronary artery disease involving native heart with angina pectoris (HCC)   Gout   Renal insufficiency   HTN (hypertension)   Acute lower UTI  SIRS/possible sepsis: Was found to be febrile up to 103 Fahrenheit with tachycardia and tachypnea on presentation.  Also presented with leukocytosis and elevated lactic acid.Both have improved. Patient was started on IV fluids and broad-spectrum antibiotics. Clinically seems improved since initial presentation Urine cx pos for ecoli.  Patient has been continued on Rocephin monotherapy now afebrile Now remains afebrile. WBC has improved  Urinary tract infection with sepsis present on admit: UA suggestive of this.  Urine cx pos for gm neg in rods, now showing ecoli.  Patient now afebrile per above. Currently.  Chest pain: Patient complained of chest pain on presentation.  Mild elevation of troponin but has normalized now.  EKG was not suggestive of any acute ischemic changes.    Patient is reporting chest pain, however pain extends well beyond her chest.  Patient has known chronic gout pains involving diffuse parts of her body including her chest,  bilateral upper extremities, bilateral feet and ankles.  Per below, presenting ESR was noted to be 120 and patient had been on daily prednisone for several years.  Patient has been referred to local rheumatologist, has not seen yet.  Have continue patient on high-dose IV steroids as per below. Patient reports previously taking daily naproxen that was stopped secondary to running out of med. Much improved since restarting naproxen  CKD: Baseline creatinine ranges from 1.2-1.3.  Renal function stable at present  Congestive heart failure:last EF 30-35%,s/p ICD.  On Lasix 40 mg at home which is on hold.  Appears to be compensated at this time.  Stable  History of coronary artery disease: Continue her home meds.  Patient does report chest pains which are highly reproducible, consistent with concerns for arthritic/gouty pain as per below  History of gout/arthritis: Follows with rheumatologist. Had been on prensione 10 mg daily for several years.  Continued pain medications.  Pateint presently on colchicine and  IV steroids.  PT/OT consult requested. Recommendations for home health noted ESR of 120 with markedly elevated CRP at time of presentation, reviewed Continued on IV steroids. Patient is continued on home colchicine.  Added allopurinol. Much improvement since naproxen was started  Elevated liver enzymes: Had improved. Continue to monitor for now  Hypertension: Currently blood pressure on lower side. She is on Coreg, clonidine, lasix, losartan at home which were held.  Patient was given gentle IV fluids. BP improved  DVT prophylaxis: Lovenox subQ Code Status: Full Family Communication: Pt in room, family not at bedside Disposition Plan: Uncertain at this time  Consultants:     Procedures:  Antimicrobials: Anti-infectives (From admission, onward)   Start     Dose/Rate Route Frequency Ordered Stop   09/03/17 1400  amoxicillin (AMOXIL) capsule 500 mg     500 mg Oral Every 8  hours 09/03/17 0950 09/07/17 1359   09/02/17 1400  cefTRIAXone (ROCEPHIN) 1 g in sodium chloride 0.9 % 100 mL IVPB  Status:  Discontinued     1 g 200 mL/hr over 30 Minutes Intravenous Every 24 hours 09/02/17 0941 09/03/17 0950   09/01/17 1800  vancomycin (VANCOCIN) IVPB 1000 mg/200 mL premix  Status:  Discontinued     1,000 mg 200 mL/hr over 60 Minutes Intravenous Every 36 hours 08/31/17 2228 09/02/17 0941   08/31/17 2230  piperacillin-tazobactam (ZOSYN) IVPB 3.375 g  Status:  Discontinued     3.375 g 12.5 mL/hr over 240 Minutes Intravenous Every 8 hours 08/31/17 2224 09/02/17 0942   08/31/17 1730  piperacillin-tazobactam (ZOSYN) IVPB 3.375 g     3.375 g 100 mL/hr over 30 Minutes Intravenous  Once 08/31/17 1717 08/31/17 1845   08/31/17 1730  vancomycin (VANCOCIN) IVPB 1000 mg/200 mL premix     1,000 mg 200 mL/hr over 60 Minutes Intravenous  Once 08/31/17 1717 08/31/17 2018      Subjective: Feels much better today  Objective: Vitals:   09/04/17 2118 09/04/17 2327 09/05/17 0456 09/05/17 1257  BP: (!) 149/80  (!) 165/89 131/76  Pulse: 70  76 79  Resp: 20  20 18   Temp: 97.9 F (36.6 C)  98.2 F (36.8 C) 98.1 F (36.7 C)  TempSrc: Oral  Oral Oral  SpO2: 97%  98% 100%  Weight:  88.5 kg (195 lb 1.7 oz) 88.2 kg (194 lb 7.1 oz)   Height:        Intake/Output Summary (Last 24 hours) at 09/05/2017 1529 Last data filed at 09/05/2017 1000 Gross per 24 hour  Intake 2895 ml  Output -  Net 2895 ml   Filed Weights   09/01/17 0246 09/04/17 2327 09/05/17 0456  Weight: 82.1 kg (181 lb 1.6 oz) 88.5 kg (195 lb 1.7 oz) 88.2 kg (194 lb 7.1 oz)    Examination: General exam: Awake, laying in bed, in nad Respiratory system: Normal respiratory effort, no wheezing Cardiovascular system: regular rate, s1, s2 Gastrointestinal system: Soft, nondistended, positive BS Central nervous system: CN2-12 grossly intact, strength intact Extremities: Perfused, no clubbing Skin: Normal skin turgor, no  notable skin lesions seen Psychiatry: Mood normal // no visual hallucinations   Data Reviewed: I have personally reviewed following labs and imaging studies  CBC: Recent Labs  Lab 08/31/17 1733 09/01/17 0458 09/03/17 0408 09/04/17 0357 09/05/17 0406  WBC 15.5* 12.8* 10.2 9.0 10.3  NEUTROABS 11.5*  --   --   --   --   HGB 12.6 10.1* 9.0* 9.1* 9.1*  HCT 37.4 30.1* 27.7* 28.2* 28.3*  MCV 87.8 88.5 91.1 90.4 90.7  PLT 316 276 408* 390 726*   Basic Metabolic Panel: Recent Labs  Lab 08/31/17 1733 09/01/17 0458 09/03/17 0408 09/04/17 0357 09/05/17 0406  NA 133* 137 143 143 142  K 3.6 3.8 3.6 3.9 4.1  CL 94* 102 108 107 105  CO2 25 24 25 27 28   GLUCOSE 138* 153* 140* 147* 99  BUN 13 14 20 12 16   CREATININE 1.43* 1.36* 1.33* 1.14* 1.20*  CALCIUM 10.1 9.0 9.0 9.1 9.1   GFR: Estimated Creatinine Clearance: 57.2 mL/min (A) (by C-G formula based on SCr of 1.2 mg/dL (H)). Liver Function Tests:  Recent Labs  Lab 08/31/17 1733 09/01/17 0458 09/03/17 0408  AST 93* 44* 74*  ALT 72* 47 62*  ALKPHOS 170* 123 92  BILITOT 2.7* 1.6* 0.2*  PROT 9.6* 7.4 7.0  ALBUMIN 3.8 2.8* 2.7*   No results for input(s): LIPASE, AMYLASE in the last 168 hours. No results for input(s): AMMONIA in the last 168 hours. Coagulation Profile: No results for input(s): INR, PROTIME in the last 168 hours. Cardiac Enzymes: Recent Labs  Lab 08/31/17 1733 08/31/17 2259 09/01/17 0458 09/01/17 1210  TROPONINI 0.03* 0.03* <0.03 <0.03   BNP (last 3 results) No results for input(s): PROBNP in the last 8760 hours. HbA1C: No results for input(s): HGBA1C in the last 72 hours. CBG: No results for input(s): GLUCAP in the last 168 hours. Lipid Profile: No results for input(s): CHOL, HDL, LDLCALC, TRIG, CHOLHDL, LDLDIRECT in the last 72 hours. Thyroid Function Tests: No results for input(s): TSH, T4TOTAL, FREET4, T3FREE, THYROIDAB in the last 72 hours. Anemia Panel: No results for input(s): VITAMINB12,  FOLATE, FERRITIN, TIBC, IRON, RETICCTPCT in the last 72 hours. Sepsis Labs: Recent Labs  Lab 08/31/17 1735 08/31/17 1747 08/31/17 1901 08/31/17 2259  PROCALCITON  --   --   --  0.85  LATICACIDVEN 2.12* 2.36* 1.56  --     Recent Results (from the past 240 hour(s))  Blood Culture (routine x 2)     Status: None   Collection Time: 08/31/17  5:33 PM  Result Value Ref Range Status   Specimen Description   Final    BLOOD LEFT ARM Performed at [facility] [address] [place], [place] [address]    Special Requests   Final    BOTTLES DRAWN AEROBIC AND ANAEROBIC Blood Culture adequate volume Performed at [facility] [address] [place], [place] [address]    Culture   Final    NO GROWTH 5 DAYS Performed at [facility], [place] [address], [place], [place] [address]    Report Status 09/05/2017 FINAL  Final  Urine culture     Status: Abnormal   Collection Time: 08/31/17  7:19 PM  Result Value Ref Range Status   Specimen Description   Final    URINE, CLEAN CATCH Performed at [facility], [place] [address] [place], [place] [address]    Special Requests   Final    NONE Performed at [facility], [place] [address] [place], [place] [address]    Culture >=100,000 COLONIES/mL ESCHERICHIA COLI (A)  Final   Report Status 09/03/2017 FINAL  Final   Organism ID, Bacteria ESCHERICHIA COLI (A)  Final      Susceptibility   Escherichia coli - MIC*    AMPICILLIN <=2 SENSITIVE Sensitive     CEFAZOLIN <=4 SENSITIVE Sensitive     CEFTRIAXONE <=1 SENSITIVE Sensitive     CIPROFLOXACIN <=0.25 SENSITIVE Sensitive     GENTAMICIN <=1 SENSITIVE Sensitive     IMIPENEM <=0.25 SENSITIVE Sensitive     NITROFURANTOIN <=16 SENSITIVE Sensitive     TRIMETH/SULFA <=20 SENSITIVE Sensitive     AMPICILLIN/SULBACTAM <=2 SENSITIVE Sensitive     PIP/TAZO <=4 SENSITIVE Sensitive     Extended ESBL NEGATIVE Sensitive     * >=100,000  COLONIES/mL ESCHERICHIA COLI  Respiratory Panel by PCR     Status: None   Collection Time: 08/31/17  7:24 PM  Result Value Ref Range Status   Adenovirus NOT DETECTED NOT DETECTED Final   Coronavirus 229E  NOT DETECTED NOT DETECTED Final   Coronavirus HKU1 NOT DETECTED NOT DETECTED Final   Coronavirus NL63 NOT DETECTED NOT DETECTED Final   Coronavirus OC43 NOT DETECTED NOT DETECTED Final   Metapneumovirus NOT DETECTED NOT DETECTED Final   Rhinovirus / Enterovirus NOT DETECTED NOT DETECTED Final   Influenza A NOT DETECTED NOT DETECTED Final   Influenza B NOT DETECTED NOT DETECTED Final   Parainfluenza Virus 1 NOT DETECTED NOT DETECTED Final   Parainfluenza Virus 2 NOT DETECTED NOT DETECTED Final   Parainfluenza Virus 3 NOT DETECTED NOT DETECTED Final   Parainfluenza Virus 4 NOT DETECTED NOT DETECTED Final   Respiratory Syncytial Virus NOT DETECTED NOT DETECTED Final   Bordetella pertussis NOT DETECTED NOT DETECTED Final   Chlamydophila pneumoniae NOT DETECTED NOT DETECTED Final   Mycoplasma pneumoniae NOT DETECTED NOT DETECTED Final    Comment: Performed at [facility], [place] [address] [place], [place] [address]  Blood Culture (routine x 2)     Status: None (Preliminary result)   Collection Time: 08/31/17 11:06 PM  Result Value Ref Range Status   Specimen Description   Final    BLOOD LEFT WRIST Performed at [facility] [address], [place], [place] [address]    Special Requests   Final    BOTTLES DRAWN AEROBIC AND ANAEROBIC Blood Culture adequate volume Performed at [facility] [address], [place], [place] [address]    Culture   Final    NO GROWTH 4 DAYS Performed at [facility], [place] [address] [place], [place] [address]    Report Status PENDING  Incomplete     Radiology Studies: No results found.  Scheduled Meds: . allopurinol  100 mg Oral BID  . amoxicillin  500 mg Oral Q8H  . carvedilol  12.5 mg Oral BID WC    . colchicine  0.6 mg Oral BID  . enoxaparin (LOVENOX) injection  40 mg Subcutaneous Q24H  . isosorbide mononitrate  30 mg Oral Daily  . naproxen  500 mg Oral BID WC  . pantoprazole  40 mg Oral Daily  . predniSONE  40 mg Oral Q breakfast  . zolpidem  5 mg Oral QHS   Continuous Infusions: . sodium chloride 75 mL/hr at 09/03/17 2100     LOS: 5 days   Marylu Lund, MD [facility] Pager [phone]  If 7PM-7AM, please contact night-coverage [email or web address] Password [facility] 09/05/2017, 3:29 PM

## 2017-09-06 LAB — BASIC METABOLIC PANEL
ANION GAP: 9 (ref 5–15)
BUN: 19 mg/dL (ref 6–20)
CALCIUM: 9.1 mg/dL (ref 8.9–10.3)
CO2: 27 mmol/L (ref 22–32)
CREATININE: 1.11 mg/dL — AB (ref 0.44–1.00)
Chloride: 106 mmol/L (ref 101–111)
GFR calc Af Amer: >60 mL/min (ref >60)
GFR calc non Af Amer: 56 mL/min — ABNORMAL LOW (ref >60)
Glucose, Bld: 135 mg/dL — ABNORMAL HIGH (ref 65–99)
Potassium: 4.2 mmol/L (ref 3.5–5.1)
SODIUM: 142 mmol/L (ref 135–145)

## 2017-09-06 LAB — CULTURE, BLOOD (ROUTINE X 2)
Culture: NO GROWTH
Special Requests: ADEQUATE

## 2017-09-06 MED ORDER — PREDNISONE 5 MG PO TABS: 5.0000 mg | ORAL_TABLET | Freq: Every day | ORAL | Status: DC

## 2017-09-06 MED ORDER — AMOXICILLIN 500 MG PO CAPS: 500.0000 mg | ORAL_CAPSULE | Freq: Three times a day (TID) | ORAL | 0 refills | Status: AC

## 2017-09-06 MED ORDER — HYDROCODONE-ACETAMINOPHEN 5-325 MG PO TABS: 1.0000 | ORAL_TABLET | ORAL | 0 refills | Status: DC | PRN

## 2017-09-06 MED ORDER — OXYCODONE-ACETAMINOPHEN 10-325 MG PO TABS: 1.0000 | ORAL_TABLET | Freq: Four times a day (QID) | ORAL | 0 refills | Status: DC | PRN

## 2017-09-06 MED ORDER — NAPROXEN 500 MG PO TABS: 500.0000 mg | ORAL_TABLET | Freq: Two times a day (BID) | ORAL | 0 refills | Status: AC

## 2017-09-06 MED ORDER — HYDROCODONE-ACETAMINOPHEN 5-325 MG PO TABS
1.0000 | ORAL_TABLET | ORAL | Status: DC | PRN
Start: 2017-09-06 — End: 2017-09-06

## 2017-09-06 MED ORDER — ALLOPURINOL 100 MG PO TABS: 100.0000 mg | ORAL_TABLET | Freq: Two times a day (BID) | ORAL | 0 refills | Status: DC

## 2017-09-06 NOTE — Discharge Summary (Signed)
Physician Discharge Summary  Kathy Bruce HYI:[id number] DOB: 1965/07/10 DOA: 08/31/2017  PCP: Hayden Rasmussen, MD  Admit date: 08/31/2017 Discharge date: 09/06/2017  Admitted From: Home Disposition:  Home  Recommendations for Outpatient Follow-up:  Follow up with PCP in 2-3 weeks Follow up with Rheumatology as scheduled  [place] reviewed. Last rx for oxycodone for 2 week supply on 08/21/17. Will prescribe short course of narcotic for severe joint pains  Discharge Condition:Improved CODE STATUS:Full Diet recommendation: Heart healthy   Brief/Interim Summary: 52 year old female with past medical history significant for systolic CHF, last ejection fraction 30-35%, status post ICD, coronary artery disease, gout, arthritis on chronic steroid use who presents to the emergency department with complaints of generalized body ache and generalized weakness. Patient was found to be febrile up to 103 F. Patient was also having urinary frequency.Patient's UA was suggestive of urinary tract infection and she was admitted for furthermanagement .  Sepsis with UTI per below: Was found to be febrile up to 103 Fahrenheit with tachycardia and tachypnea on presentation.Also presented with leukocytosis and elevated lactic acid.Both have improved. Patient was started on IV fluids and broad-spectrum antibiotics. Clinically seems improved since initial presentation Urine cx pos for ecoli.  Patient had been continued on Rocephin monotherapy now afebrile. Continued on amoxicillin to complete course Now remains afebrile. WBC has improved  Urinary tract infection with sepsis present on admit:UA suggestive of this. Urine cx pos for gm neg in rods, now showing ecoli.  Patient now afebrile per above. Transitioned to amoxicillin with continued improvement. Patient to complete course of amoxicillin on discharge  Chest pain: Patient complained of chest pain on presentation. Mild elevation of troponin but has  normalized now. EKG was not suggestiveof any acute ischemic changes.   Patient had reported chest pain, however pain extends well beyond her chest.  Patient has known chronic gout pains involving diffuse parts of her body including her chest, bilateral upper extremities, bilateral feet and ankles.  Per below, presenting ESR was noted to be 120 and patient had been on daily prednisone for several years.  Patient has been referred to local rheumatologist, has not seen yet.  Have continue patient on high-dose IV steroids as per below. Patient reports previously taking daily naproxen that was stopped secondary to running out of med. Much improved since restarting naproxen  CKD: Baseline creatinine ranges from 1.2-1.3.  Cr 1.11 at time of discharge  Congestive heart failure:last EF 30-35%,s/p ICD.On Lasix 40 mg at home which is on hold.Appears to be compensated at this time.  Stable  History of coronary artery disease: Continue her home meds.  Patient does report chest pains which are highly reproducible, consistent with concerns for arthritic/gouty pain as per below  History of gout/arthritis:Follows with rheumatologist. Had been on prensione10 mg daily for several years. Continued pain medications.Pateint presently on colchicine and  IV steroids. PT/OT consult requested. Recommendations for home health noted ESR of 120 with markedly elevated CRP at time of presentation, reviewed Patient given IV steroids, to be weaned on discharge Patient is continued on home colchicine.  Added allopurinol. Much improvement since naproxen was started. Patient to follow up with Rheumatology on discharge  Elevated liver enzymes: Had improved. Continue to monitor for now  Hypertension: Currently blood pressureon lower side.She is on Coreg, clonidine, lasix, losartan at homewhich were initially held.Patient was given gentle IV fluids. BP improved. Resume home meds on discharge    Discharge  Diagnoses:  Principal Problem:   SIRS (systemic inflammatory response syndrome) (  [place]) Active Problems:   ICD (implantable cardioverter-defibrillator) in place, [facility], 2013   Chest pain   Coronary artery disease involving native heart with angina pectoris (HCC)   Gout   Renal insufficiency   HTN (hypertension)   Acute lower UTI    Discharge Instructions   Allergies as of 09/06/2017   No Known Allergies     Medication List    STOP taking these medications   oxyCODONE-acetaminophen 10-325 MG tablet Commonly known as:  PERCOCET     TAKE these medications   allopurinol 100 MG tablet Commonly known as:  ZYLOPRIM Take 1 tablet (100 mg total) by mouth 2 (two) times daily.   amoxicillin 500 MG capsule Commonly known as:  AMOXIL Take 1 capsule (500 mg total) by mouth every 8 (eight) hours for 3 days.   aspirin EC 81 MG tablet Take 1 tablet (81 mg total) by mouth daily.   atorvastatin 40 MG tablet Commonly known as:  LIPITOR Take 1 tablet (40 mg total) by mouth daily.   baclofen 10 MG tablet Commonly known as:  LIORESAL Take 10 mg by mouth 3 (three) times daily.   carvedilol 12.5 MG tablet Commonly known as:  COREG Take 1 tablet (12.5 mg total) by mouth 2 (two) times daily with a meal.   cloNIDine 0.1 MG tablet Commonly known as:  CATAPRES Take 1 tablet (0.1 mg total) by mouth daily.   colchicine 0.6 MG tablet Take 1 tablet (0.6 mg total) by mouth 2 (two) times daily.   furosemide 40 MG tablet Commonly known as:  LASIX Take 1 tablet (40 mg total) by mouth daily.   HYDROcodone-acetaminophen 5-325 MG tablet Commonly known as:  NORCO/VICODIN Take 1 tablet by mouth every 4 (four) hours as needed for severe pain.   isosorbide mononitrate 30 MG 24 hr tablet Commonly known as:  IMDUR Take 1 tablet (30 mg total) by mouth daily.   losartan 50 MG tablet Commonly known as:  COZAAR Take 1 tablet (50 mg total) by mouth daily.   naproxen 500 MG tablet Commonly known  as:  NAPROSYN Take 1 tablet (500 mg total) by mouth 2 (two) times daily with a meal.   nitroGLYCERIN 0.4 MG SL tablet Commonly known as:  NITROSTAT Place 0.4 mg under the tongue every 5 (five) minutes as needed for chest pain.   omeprazole 40 MG capsule Commonly known as:  PRILOSEC Take 1 capsule (40 mg total) by mouth daily.   predniSONE 10 MG tablet Commonly known as:  DELTASONE Take 1 tablet (10 mg total) by mouth daily with breakfast. What changed:  Another medication with the same name was added. Make sure you understand how and when to take each.   predniSONE 5 MG tablet Commonly known as:  DELTASONE Take 1 tablet (5 mg total) by mouth daily with breakfast. What changed:  You were already taking a medication with the same name, and this prescription was added. Make sure you understand how and when to take each.   tetrahydrozoline 0.05 % ophthalmic solution Commonly known as:  VISINE Place 2 drops into both eyes daily as needed (DRY EYES).   zolpidem 10 MG tablet Commonly known as:  AMBIEN Take 10 mg by mouth at bedtime. What changed:  Another medication with the same name was removed. Continue taking this medication, and follow the directions you see here.      Follow-up Information    Hayden Rasmussen, MD. Schedule an appointment as soon as possible for  a visit in 2 week(s).   Specialty:  Family Medicine Contact information: [place] [address] [phone]        Follow up with your rheumatologist as scheduled Follow up.          No Known Allergies   Procedures/Studies: Dg Chest 2 View  Result Date: 08/31/2017 CLINICAL DATA:  Chest pain EXAM: CHEST - 2 VIEW COMPARISON:  July 02, 2017 FINDINGS: The subcutaneous implantable defibrillator is again noted with lead position unchanged. Heart is borderline enlarged with pulmonary vascularity within normal limits. No edema or consolidation. No pneumothorax. No bone lesions.  IMPRESSION: Stable cardiac prominence. No edema or consolidation. Stable positioning of subcutaneous implantable defibrillator lead. Electronically Signed   By: Lowella Grip III M.D.   On: 08/31/2017 17:30   Ct Angio Chest Pe W And/or Wo Contrast  Result Date: 08/31/2017 CLINICAL DATA:  52 year old female with intermittent chest pain since last night. EXAM: CT ANGIOGRAPHY CHEST WITH CONTRAST TECHNIQUE: Multidetector CT imaging of the chest was performed using the standard protocol during bolus administration of intravenous contrast. Multiplanar CT image reconstructions and MIPs were obtained to evaluate the vascular anatomy. CONTRAST:  54m ISOVUE-370 IOPAMIDOL (ISOVUE-370) INJECTION 76% COMPARISON:  CTA chest abdomen and pelvis 07/02/2017 FINDINGS: Cardiovascular: Good contrast bolus timing in the pulmonary arterial tree. Mild respiratory motion, moderate in both lower lobes. Subsequently, the distal bilateral lower lobe branches are not well evaluated. But otherwise there is no focal filling defect identified in the pulmonary arteries to suggest acute pulmonary embolism. Stable mild cardiomegaly. No pericardial effusion. Negative thoracic and upper abdominal aorta aside from mild soft atherosclerosis. No definite calcified coronary artery atherosclerosis. Mediastinum/Nodes: Stable and negative. Lungs/Pleura: Lower lung volumes. Atelectatic changes to the hilar airways, greater on the left. The central airways are patent. Mild dependent atelectasis in both lungs. No pleural effusion or other abnormal pulmonary opacity. Upper Abdomen: The visible upper abdomen is stable and negative. Musculoskeletal: Small intramuscular lipoma in the right posterior shoulder musculature incidentally noted and benign (series 4, image 11). Left axillary or lateral chest wall AICD generator device with a single subcutaneous left lateral and central chest wall lead is stable. No acute osseous abnormality identified. Review of  the MIP images confirms the above findings. IMPRESSION: 1. No acute pulmonary embolus identified. The distal bilateral lower lobe branches are obscured by motion. 2. Lower lung volumes with atelectasis. No other acute pulmonary process. 3. Stable cardiomegaly.  No pericardial effusion. Electronically Signed   By: HGenevie AnnM.D.   On: 08/31/2017 20:38    Subjective: Feels much better. Eager to go home  Discharge Exam: Vitals:   09/06/17 0513 09/06/17 0830  BP: (!) 148/78 (!) 160/99  Pulse: 65 67  Resp: 18   Temp: 98.3 F (36.8 C)   SpO2: 98%    Vitals:   09/05/17 1257 09/05/17 2120 09/06/17 0513 09/06/17 0830  BP: 131/76 (!) 146/94 (!) 148/78 (!) 160/99  Pulse: 79 87 65 67  Resp: 18 18 18    Temp: 98.1 F (36.7 C) 98.6 F (37 C) 98.3 F (36.8 C)   TempSrc: Oral Oral Oral   SpO2: 100% 99% 98%   Weight: 86.4 kg (190 lb 6.4 oz)  84.4 kg (186 lb)   Height:        General: Pt is alert, awake, not in acute distress Cardiovascular: RRR, S1/S2 +, no rubs, no gallops Respiratory: CTA bilaterally, no wheezing, no rhonchi Abdominal: Soft, NT, ND, bowel  sounds + Extremities: no edema, no cyanosis   The results of significant diagnostics from this hospitalization (including imaging, microbiology, ancillary and laboratory) are listed below for reference.     Microbiology: Recent Results (from the past 240 hour(s))  Blood Culture (routine x 2)     Status: None   Collection Time: 08/31/17  5:33 PM  Result Value Ref Range Status   Specimen Description   Final    BLOOD LEFT ARM Performed at [facility], [place] [address] [place], [place] [address]    Special Requests   Final    BOTTLES DRAWN AEROBIC AND ANAEROBIC Blood Culture adequate volume Performed at [place] [address] [place], [place] [address]    Culture   Final    NO GROWTH 5 DAYS Performed at [facility], [place] [address], [place], [place] [address]    Report Status  09/05/2017 FINAL  Final  Urine culture     Status: Abnormal   Collection Time: 08/31/17  7:19 PM  Result Value Ref Range Status   Specimen Description   Final    URINE, CLEAN CATCH Performed at [facility], [place] [address] [place], [place] [address]    Special Requests   Final    NONE Performed at [facility], [place] [address] [place], [place] [address]    Culture >=100,000 COLONIES/mL ESCHERICHIA COLI (A)  Final   Report Status 09/03/2017 FINAL  Final   Organism ID, Bacteria ESCHERICHIA COLI (A)  Final      Susceptibility   Escherichia coli - MIC*    AMPICILLIN <=2 SENSITIVE Sensitive     CEFAZOLIN <=4 SENSITIVE Sensitive     CEFTRIAXONE <=1 SENSITIVE Sensitive     CIPROFLOXACIN <=0.25 SENSITIVE Sensitive     GENTAMICIN <=1 SENSITIVE Sensitive     IMIPENEM <=0.25 SENSITIVE Sensitive     NITROFURANTOIN <=16 SENSITIVE Sensitive     TRIMETH/SULFA <=20 SENSITIVE Sensitive     AMPICILLIN/SULBACTAM <=2 SENSITIVE Sensitive     PIP/TAZO <=4 SENSITIVE Sensitive     Extended ESBL NEGATIVE Sensitive     * >=100,000 COLONIES/mL ESCHERICHIA COLI  Respiratory Panel by PCR     Status: None   Collection Time: 08/31/17  7:24 PM  Result Value Ref Range Status   Adenovirus NOT DETECTED NOT DETECTED Final   Coronavirus 229E NOT DETECTED NOT DETECTED Final   Coronavirus HKU1 NOT DETECTED NOT DETECTED Final   Coronavirus NL63 NOT DETECTED NOT DETECTED Final   Coronavirus OC43 NOT DETECTED NOT DETECTED Final   Metapneumovirus NOT DETECTED NOT DETECTED Final   Rhinovirus / Enterovirus NOT DETECTED NOT DETECTED Final   Influenza A NOT DETECTED NOT DETECTED Final   Influenza B NOT DETECTED NOT DETECTED Final   Parainfluenza Virus 1 NOT DETECTED NOT DETECTED Final   Parainfluenza Virus 2 NOT DETECTED NOT DETECTED Final   Parainfluenza Virus 3 NOT DETECTED NOT DETECTED Final   Parainfluenza Virus 4 NOT DETECTED NOT DETECTED Final   Respiratory Syncytial Virus  NOT DETECTED NOT DETECTED Final   Bordetella pertussis NOT DETECTED NOT DETECTED Final   Chlamydophila pneumoniae NOT DETECTED NOT DETECTED Final   Mycoplasma pneumoniae NOT DETECTED NOT DETECTED Final    Comment: Performed at [facility], [place]. [address] [place],  [address]  Blood Culture (routine x 2)     Status: None   Collection Time: 08/31/17 11:06 PM  Result Value Ref Range Status  Specimen Description   Final    BLOOD LEFT WRIST Performed at [place] [address], [place], [place] [address]    Special Requests   Final    BOTTLES DRAWN AEROBIC AND ANAEROBIC Blood Culture adequate volume Performed at [place] [address], [place], [place] [address]    Culture   Final    NO GROWTH 5 DAYS Performed at [facility], [place] [address], [place], [place] [address]    Report Status 09/06/2017 FINAL  Final     Labs: BNP (last 3 results) No results for input(s): BNP in the last 8760 hours. Basic Metabolic Panel: Recent Labs  Lab 09/01/17 0458 09/03/17 0408 09/04/17 0357 09/05/17 0406 09/06/17 0420  NA 137 143 143 142 142  K 3.8 3.6 3.9 4.1 4.2  CL 102 108 107 105 106  CO2 24 25 27 28 27   GLUCOSE 153* 140* 147* 99 135*  BUN 14 20 12 16 19   CREATININE 1.36* 1.33* 1.14* 1.20* 1.11*  CALCIUM 9.0 9.0 9.1 9.1 9.1   Liver Function Tests: Recent Labs  Lab 08/31/17 1733 09/01/17 0458 09/03/17 0408  AST 93* 44* 74*  ALT 72* 47 62*  ALKPHOS 170* 123 92  BILITOT 2.7* 1.6* 0.2*  PROT 9.6* 7.4 7.0  ALBUMIN 3.8 2.8* 2.7*   No results for input(s): LIPASE, AMYLASE in the last 168 hours. No results for input(s): AMMONIA in the last 168 hours. CBC: Recent Labs  Lab 08/31/17 1733 09/01/17 0458 09/03/17 0408 09/04/17 0357 09/05/17 0406  WBC 15.5* 12.8* 10.2 9.0 10.3  NEUTROABS 11.5*  --   --   --   --   HGB 12.6 10.1* 9.0* 9.1* 9.1*  HCT 37.4 30.1* 27.7* 28.2* 28.3*  MCV 87.8 88.5 91.1 90.4 90.7  PLT  316 276 408* 390 427*   Cardiac Enzymes: Recent Labs  Lab 08/31/17 1733 08/31/17 2259 09/01/17 0458 09/01/17 1210  TROPONINI 0.03* 0.03* <0.03 <0.03   BNP: Invalid input(s): POCBNP CBG: No results for input(s): GLUCAP in the last 168 hours. D-Dimer No results for input(s): DDIMER in the last 72 hours. Hgb A1c No results for input(s): HGBA1C in the last 72 hours. Lipid Profile No results for input(s): CHOL, HDL, LDLCALC, TRIG, CHOLHDL, LDLDIRECT in the last 72 hours. Thyroid function studies No results for input(s): TSH, T4TOTAL, T3FREE, THYROIDAB in the last 72 hours.  Invalid input(s): FREET3 Anemia work up No results for input(s): VITAMINB12, FOLATE, FERRITIN, TIBC, IRON, RETICCTPCT in the last 72 hours. Urinalysis    Component Value Date/Time   COLORURINE AMBER (A) 08/31/2017 1918   APPEARANCEUR HAZY (A) 08/31/2017 1918   LABSPEC 1.024 08/31/2017 1918   PHURINE 5.0 08/31/2017 1918   GLUCOSEU NEGATIVE 08/31/2017 1918   HGBUR SMALL (A) 08/31/2017 1918   BILIRUBINUR SMALL (A) 08/31/2017 1918   KETONESUR NEGATIVE 08/31/2017 1918   PROTEINUR >=300 (A) 08/31/2017 1918   NITRITE NEGATIVE 08/31/2017 1918   LEUKOCYTESUR SMALL (A) 08/31/2017 1918   Sepsis Labs Invalid input(s): PROCALCITONIN,  WBC,  LACTICIDVEN Microbiology Recent Results (from the past 240 hour(s))  Blood Culture (routine x 2)     Status: None   Collection Time: 08/31/17  5:33 PM  Result Value Ref Range Status   Specimen Description   Final    BLOOD LEFT ARM Performed at [facility], [place] [address] [place], [place] [address]    Special Requests   Final    BOTTLES DRAWN AEROBIC  AND ANAEROBIC Blood Culture adequate volume Performed at [place] [address], [place], [place] [address]    Culture   Final    NO GROWTH 5 DAYS Performed at [facility], [place] [address] [place], [place] [address]    Report Status 09/05/2017 FINAL  Final  Urine  culture     Status: Abnormal   Collection Time: 08/31/17  7:19 PM  Result Value Ref Range Status   Specimen Description   Final    URINE, CLEAN CATCH Performed at [facility], [place] [address] [place], [place] [address]    Special Requests   Final    NONE Performed at [facility], [place] [address], [place], [place] [address]    Culture >=100,000 COLONIES/mL ESCHERICHIA COLI (A)  Final   Report Status 09/03/2017 FINAL  Final   Organism ID, Bacteria ESCHERICHIA COLI (A)  Final      Susceptibility   Escherichia coli - MIC*    AMPICILLIN <=2 SENSITIVE Sensitive     CEFAZOLIN <=4 SENSITIVE Sensitive     CEFTRIAXONE <=1 SENSITIVE Sensitive     CIPROFLOXACIN <=0.25 SENSITIVE Sensitive     GENTAMICIN <=1 SENSITIVE Sensitive     IMIPENEM <=0.25 SENSITIVE Sensitive     NITROFURANTOIN <=16 SENSITIVE Sensitive     TRIMETH/SULFA <=20 SENSITIVE Sensitive     AMPICILLIN/SULBACTAM <=2 SENSITIVE Sensitive     PIP/TAZO <=4 SENSITIVE Sensitive     Extended ESBL NEGATIVE Sensitive     * >=100,000 COLONIES/mL ESCHERICHIA COLI  Respiratory Panel by PCR     Status: None   Collection Time: 08/31/17  7:24 PM  Result Value Ref Range Status   Adenovirus NOT DETECTED NOT DETECTED Final   Coronavirus 229E NOT DETECTED NOT DETECTED Final   Coronavirus HKU1 NOT DETECTED NOT DETECTED Final   Coronavirus NL63 NOT DETECTED NOT DETECTED Final   Coronavirus OC43 NOT DETECTED NOT DETECTED Final   Metapneumovirus NOT DETECTED NOT DETECTED Final   Rhinovirus / Enterovirus NOT DETECTED NOT DETECTED Final   Influenza A NOT DETECTED NOT DETECTED Final   Influenza B NOT DETECTED NOT DETECTED Final   Parainfluenza Virus 1 NOT DETECTED NOT DETECTED Final   Parainfluenza Virus 2 NOT DETECTED NOT DETECTED Final   Parainfluenza Virus 3 NOT DETECTED NOT DETECTED Final   Parainfluenza Virus 4 NOT DETECTED NOT DETECTED Final   Respiratory Syncytial Virus NOT DETECTED NOT DETECTED Final    Bordetella pertussis NOT DETECTED NOT DETECTED Final   Chlamydophila pneumoniae NOT DETECTED NOT DETECTED Final   Mycoplasma pneumoniae NOT DETECTED NOT DETECTED Final    Comment: Performed at [facility], [place]. [address] [place], [place] [address]  Blood Culture (routine x 2)     Status: None   Collection Time: 08/31/17 11:06 PM  Result Value Ref Range Status   Specimen Description   Final    BLOOD LEFT WRIST Performed at [place] [address], [place], [place] [address]    Special Requests   Final    BOTTLES DRAWN AEROBIC AND ANAEROBIC Blood Culture adequate volume Performed at [place] [address], [place], [place] [address]    Culture   Final    NO GROWTH 5 DAYS Performed at [facility], [place] [address] [place], [place] [address]    Report Status 09/06/2017 FINAL  Final     SIGNED:   Marylu Lund, MD  [facility]  09/06/2017, 12:51 PM  If 7PM-7AM, please contact night-coverage [email or web address] Password [email or web address]

## 2017-09-06 NOTE — Progress Notes (Addendum)
Patient discharged home with friend, discharge instructions/prescription given and explained to patient, she verbalized understanding, denies any pain/distress, skin intact, no wound noted. accompanied home by friend, transported to the car by staff.

## 2017-09-06 NOTE — Care Management Note (Addendum)
Case Management Note  Patient Details  Name: Kathy Bruce MRN: [id number] Date of Birth: February 08, 1966  Subjective/Objective:    CHF, sepsis, UTI                Action/Plan: Please see previous NCM notes. Pt Wilder arranged with [facility]. Contacted [facility] to make aware of dc home today with HH.  Pt states she has a RW at home.    Expected Discharge Date:  09/06/17               Expected Discharge Plan:  [facility] [place]  In-House Referral:     Discharge planning Services  CM Consult  Post Acute Care Choice:    Choice offered to:  Patient  DME Arranged:    DME Agency:     HH Arranged:  RN, PT, OT HH Agency:  [facility]  Status of Service:  Completed, signed off  If discussed at [place] of Stay Meetings, dates discussed:    Additional Comments:  Erenest Rasher, RN 09/06/2017, 1:14 PM

## 2017-09-14 ENCOUNTER — Ambulatory Visit (INDEPENDENT_AMBULATORY_CARE_PROVIDER_SITE_OTHER): Payer: Medicaid Other | Admitting: Gastroenterology

## 2017-09-14 ENCOUNTER — Encounter: Payer: Self-pay | Admitting: Gastroenterology

## 2017-09-14 VITALS — BP 122/74 | HR 99 | Ht 62.0 in | Wt 183.0 lb

## 2017-09-14 DIAGNOSIS — I509 Heart failure, unspecified: Secondary | ICD-10-CM | POA: Diagnosis not present

## 2017-09-14 DIAGNOSIS — Z1211 Encounter for screening for malignant neoplasm of colon: Secondary | ICD-10-CM

## 2017-09-14 DIAGNOSIS — R748 Abnormal levels of other serum enzymes: Secondary | ICD-10-CM

## 2017-09-14 MED ORDER — SUPREP BOWEL PREP KIT 17.5-3.13-1.6 GM/177ML PO SOLN: ORAL | 0 refills | Status: AC

## 2017-09-14 NOTE — H&P (View-Only) (Signed)
HPI :  52 y/o female with history of CHF, CAD, EF 30-35%, ICD in place, here for new patient visit to discuss options for colon cancer screening. She also has been noted to have elevated liver function testing recently.  Recently admitted for sepsis, end of March, due to UTI. Developed chest pain and had mild elevation of troponin, but EKG normal. Referred to rheumatology for evaluation of chronic pains. LFTs rose during admission and then downtrended, perhaps due to hypotension. Previously LFTs normal Jan/ Feb  She denies any changes in her bowel habits. She denies any diarrhea or constipation. She denies any blood in her stools. She denies any abdominal pains that are bothering her currently. She denies any family history of colon cancer. She denies any history of having a prior colonoscopy.  She's had a history of congestive heart failure for the past few years. Her ejection fraction is 30-35%. She denies any new symptoms related to this. She does have some exertional dyspnea and orthopnea which is stable.   She otherwise takes omeprazole 40 mg once daily. She denies any reflux symptoms that are bothering her on this dose. No dysphagia.   Echo 07/03/2017 - EF 30-35%  Past Medical History:  Diagnosis Date  . Chronic kidney disease (CKD)   . Coronary artery disease involving native heart with angina pectoris [facility]) 2014   Patient reports MI in 2013 (in [place]) -- 2nd [place] 2014 in [place], [place] --? stent placement when patient was living in [place]. Need outside records.   . Dilated cardiomyopathy ([place]) 2013   Patient reports this was secondary to MI and she to have AICD placed.   Marland Kitchen GERD (gastroesophageal reflux disease)   . Gout   . Heart failure ([place])   . 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 ICD (implantable cardioverter-defibrillator) in place, [facility], 2013 08/29/2013   [facility] in [place], (DR.  [place])  . Unspecified mood (affective) disorder [facility])      Past Surgical History:  Procedure Laterality Date  . [place] [employer] ICD  . TRANSTHORACIC ECHOCARDIOGRAM  06/2017   Mild LV dilation.  Normal thickness.  Mildly reduced EF of 30-35%.  Moderate diffuse HK.  GRII DD.  Moderate-severe MR.  Mild LA dilation    Family History  Problem Relation Age of Onset  . Hyperthyroidism Mother   . Diabetes Mother   . Tuberculosis Father   . Heart attack Brother   . Hypertension Sister   . Arthritis Sister   . Hyperthyroidism Brother   . Hypertension Brother   . Hypertension Son   . Gout Son   . Diabetes Daughter   . Stomach cancer Neg Hx   . Colon cancer Neg Hx    Social History   Tobacco Use  . Smoking status: Never Smoker  . Smokeless tobacco: Never Used  Substance Use Topics  . Alcohol use: Yes    Frequency: Never    Comment: Occasionally  . Drug use: No   Current Outpatient Medications  Medication Sig Dispense Refill  . allopurinol (ZYLOPRIM) 100 MG tablet Take 1 tablet (100 mg total) by mouth 2 (two) times daily. 60 tablet 0  . aspirin EC 81 MG tablet Take 1 tablet (81 mg total) by mouth daily. 30 tablet 2  . atorvastatin (LIPITOR) 40 MG tablet Take 1 tablet (40 mg total) by mouth daily. 30 tablet  11  . baclofen (LIORESAL) 10 MG tablet Take 10 mg by mouth 3 (three) times daily.    . carvedilol (COREG) 12.5 MG tablet Take 1 tablet (12.5 mg total) by mouth 2 (two) times daily with a meal. 60 tablet 2  . cloNIDine (CATAPRES) 0.1 MG tablet Take 1 tablet (0.1 mg total) by mouth daily. 60 tablet 11  . colchicine 0.6 MG tablet Take 1 tablet (0.6 mg total) by mouth 2 (two) times daily. 20 tablet 0  . furosemide (LASIX) 40 MG tablet Take 1 tablet (40 mg total) by mouth daily. 30 tablet 11  . HYDROcodone-acetaminophen (NORCO/VICODIN) 5-325 MG tablet Take 1 tablet by mouth every 4 (four) hours as needed for  severe pain. 20 tablet 0  . isosorbide mononitrate (IMDUR) 30 MG 24 hr tablet Take 1 tablet (30 mg total) by mouth daily. 30 tablet 11  . losartan (COZAAR) 50 MG tablet Take 1 tablet (50 mg total) by mouth daily. 30 tablet 11  . naproxen (NAPROSYN) 500 MG tablet Take 1 tablet (500 mg total) by mouth 2 (two) times daily with a meal. 60 tablet 0  . nitroGLYCERIN (NITROSTAT) 0.4 MG SL tablet Place 0.4 mg under the tongue every 5 (five) minutes as needed for chest pain.    Marland Kitchen omeprazole (PRILOSEC) 40 MG capsule Take 1 capsule (40 mg total) by mouth daily. 30 capsule 2  . predniSONE (DELTASONE) 10 MG tablet Take 1 tablet (10 mg total) by mouth daily with breakfast. 30 tablet 1  . predniSONE (DELTASONE) 5 MG tablet Take 1 tablet (5 mg total) by mouth daily with breakfast.    . tetrahydrozoline (VISINE) 0.05 % ophthalmic solution Place 2 drops into both eyes daily as needed (DRY EYES). 15 mL 0  . zolpidem (AMBIEN) 10 MG tablet Take 10 mg by mouth at bedtime.   0   No current facility-administered medications for this visit.    No Known Allergies   Review of Systems: All systems reviewed and negative except where noted in HPI.   Lab Results  Component Value Date   WBC 10.3 09/05/2017   HGB 9.1 (L) 09/05/2017   HCT 28.3 (L) 09/05/2017   MCV 90.7 09/05/2017   PLT 427 (H) 09/05/2017    Lab Results  Component Value Date   CREATININE 1.11 (H) 09/06/2017   BUN 19 09/06/2017   NA 142 09/06/2017   K 4.2 09/06/2017   CL 106 09/06/2017   CO2 27 09/06/2017    Lab Results  Component Value Date   ALT 62 (H) 09/03/2017   AST 74 (H) 09/03/2017   ALKPHOS 92 09/03/2017   BILITOT 0.2 (L) 09/03/2017     Physical Exam: BP 122/74   Pulse 99   Ht 5\' 2"  (1.575 m)   Wt 183 lb (83 kg)   BMI 33.47 kg/m  Constitutional: Pleasant,well-developed, female in no acute distress. HEENT: Normocephalic and atraumatic. Conjunctivae are normal. No scleral icterus. Neck supple.  Cardiovascular: Normal rate,  regular rhythm.  Pulmonary/chest: Effort normal and breath sounds normal. No wheezing, rales or rhonchi. Abdominal: Soft, nondistended, nontender. There are no masses palpable. No hepatomegaly. Extremities: no edema Lymphadenopathy: No cervical adenopathy noted. Neurological: Alert and oriented to person place and time. Skin: Skin is warm and dry. No rashes noted. Psychiatric: Normal mood and affect. Behavior is normal.   ASSESSMENT AND PLAN: 52 year old female with a history of congestive heart failure, here for new patient assessment of the following issues:  Colon cancer screening -  she is average risk for colon cancer , and is overdue for screening. I discussed options with her to include optical colonoscopy versus stool based testing. I discussed the risks and benefits of each of these tests with her at length. She reports her cardiopulmonary symptoms are stable at this time. Following discussion of this issue, while she is higher than average risk for anesthesia related complications, her preference is to proceed with optical colonoscopy for screening. I think is reasonable given her symptoms are stable. Her case will need to be done at the hospital for anesthesia assistance in light of her low ejection fraction. Further recommendations pending the results of the exam.  Elevated liver enzymes - recently admitted with sepsis secondary to UTI. She had elevated liver enzymes which down trended with therapy of her infection. Suspect this could've been due to hypotension in the setting of infection. Will repeat LFTs with baseline hepatitis C and B studies to ensure negative. If LFT elevation persists over time will need further serologic evaluation.  [place] Cellar, MD [facility]

## 2017-09-14 NOTE — Progress Notes (Signed)
HPI :  52 y/o female with history of CHF, CAD, EF 30-35%, ICD in place, here for new patient visit to discuss options for colon cancer screening. She also has been noted to have elevated liver function testing recently.  Recently admitted for sepsis, end of March, due to UTI. Developed chest pain and had mild elevation of troponin, but EKG normal. Referred to rheumatology for evaluation of chronic pains. LFTs rose during admission and then downtrended, perhaps due to hypotension. Previously LFTs normal Jan/ Feb  She denies any changes in her bowel habits. She denies any diarrhea or constipation. She denies any blood in her stools. She denies any abdominal pains that are bothering her currently. She denies any family history of colon cancer. She denies any history of having a prior colonoscopy.  She's had a history of congestive heart failure for the past few years. Her ejection fraction is 30-35%. She denies any new symptoms related to this. She does have some exertional dyspnea and orthopnea which is stable.   She otherwise takes omeprazole 40 mg once daily. She denies any reflux symptoms that are bothering her on this dose. No dysphagia.   Echo 07/03/2017 - EF 30-35%  Past Medical History:  Diagnosis Date  . Chronic kidney disease (CKD)   . Coronary artery disease involving native heart with angina pectoris [facility]) 2014   Patient reports MI in 2013 (in [place]) -- 2nd [place] 2014 in [place], [place] --? stent placement when patient was living in [place]. Need outside records.   . Dilated cardiomyopathy ([place]) 2013   Patient reports this was secondary to MI and she to have AICD placed.   Marland Kitchen GERD (gastroesophageal reflux disease)   . Gout   . Heart failure ([place])   . Hypertensive heart disease with chronic combined systolic and diastolic congestive heart failure ([place]) 2013  . ICD (implantable cardioverter-defibrillator) in place, [facility], 2013 08/29/2013   [facility] in [place], (DR.  [place])  . Unspecified mood (affective) disorder [facility])      Past Surgical History:  Procedure Laterality Date  . [place] [employer] ICD  . TRANSTHORACIC ECHOCARDIOGRAM  06/2017   Mild LV dilation.  Normal thickness.  Mildly reduced EF of 30-35%.  Moderate diffuse HK.  GRII DD.  Moderate-severe MR.  Mild LA dilation    Family History  Problem Relation Age of Onset  . Hyperthyroidism Mother   . Diabetes Mother   . Tuberculosis Father   . Heart attack Brother   . Hypertension Sister   . Arthritis Sister   . Hyperthyroidism Brother   . Hypertension Brother   . Hypertension Son   . Gout Son   . Diabetes Daughter   . Stomach cancer Neg Hx   . Colon cancer Neg Hx    Social History   Tobacco Use  . Smoking status: Never Smoker  . Smokeless tobacco: Never Used  Substance Use Topics  . Alcohol use: Yes    Frequency: Never    Comment: Occasionally  . Drug use: No   Current Outpatient Medications  Medication Sig Dispense Refill  . allopurinol (ZYLOPRIM) 100 MG tablet Take 1 tablet (100 mg total) by mouth 2 (two) times daily. 60 tablet 0  . aspirin EC 81 MG tablet Take 1 tablet (81 mg total) by mouth daily. 30 tablet 2  . atorvastatin (LIPITOR) 40 MG tablet Take 1 tablet (40 mg total) by mouth daily. 30 tablet  11  . baclofen (LIORESAL) 10 MG tablet Take 10 mg by mouth 3 (three) times daily.    . carvedilol (COREG) 12.5 MG tablet Take 1 tablet (12.5 mg total) by mouth 2 (two) times daily with a meal. 60 tablet 2  . cloNIDine (CATAPRES) 0.1 MG tablet Take 1 tablet (0.1 mg total) by mouth daily. 60 tablet 11  . colchicine 0.6 MG tablet Take 1 tablet (0.6 mg total) by mouth 2 (two) times daily. 20 tablet 0  . furosemide (LASIX) 40 MG tablet Take 1 tablet (40 mg total) by mouth daily. 30 tablet 11  . HYDROcodone-acetaminophen (NORCO/VICODIN) 5-325 MG tablet Take 1 tablet by mouth every 4 (four) hours as needed for  severe pain. 20 tablet 0  . isosorbide mononitrate (IMDUR) 30 MG 24 hr tablet Take 1 tablet (30 mg total) by mouth daily. 30 tablet 11  . losartan (COZAAR) 50 MG tablet Take 1 tablet (50 mg total) by mouth daily. 30 tablet 11  . naproxen (NAPROSYN) 500 MG tablet Take 1 tablet (500 mg total) by mouth 2 (two) times daily with a meal. 60 tablet 0  . nitroGLYCERIN (NITROSTAT) 0.4 MG SL tablet Place 0.4 mg under the tongue every 5 (five) minutes as needed for chest pain.    Marland Kitchen omeprazole (PRILOSEC) 40 MG capsule Take 1 capsule (40 mg total) by mouth daily. 30 capsule 2  . predniSONE (DELTASONE) 10 MG tablet Take 1 tablet (10 mg total) by mouth daily with breakfast. 30 tablet 1  . predniSONE (DELTASONE) 5 MG tablet Take 1 tablet (5 mg total) by mouth daily with breakfast.    . tetrahydrozoline (VISINE) 0.05 % ophthalmic solution Place 2 drops into both eyes daily as needed (DRY EYES). 15 mL 0  . zolpidem (AMBIEN) 10 MG tablet Take 10 mg by mouth at bedtime.   0   No current facility-administered medications for this visit.    No Known Allergies   Review of Systems: All systems reviewed and negative except where noted in HPI.   Lab Results  Component Value Date   WBC 10.3 09/05/2017   HGB 9.1 (L) 09/05/2017   HCT 28.3 (L) 09/05/2017   MCV 90.7 09/05/2017   PLT 427 (H) 09/05/2017    Lab Results  Component Value Date   CREATININE 1.11 (H) 09/06/2017   BUN 19 09/06/2017   NA 142 09/06/2017   K 4.2 09/06/2017   CL 106 09/06/2017   CO2 27 09/06/2017    Lab Results  Component Value Date   ALT 62 (H) 09/03/2017   AST 74 (H) 09/03/2017   ALKPHOS 92 09/03/2017   BILITOT 0.2 (L) 09/03/2017     Physical Exam: BP 122/74   Pulse 99   Ht 5\' 2"  (1.575 m)   Wt 183 lb (83 kg)   BMI 33.47 kg/m  Constitutional: Pleasant,well-developed, female in no acute distress. HEENT: Normocephalic and atraumatic. Conjunctivae are normal. No scleral icterus. Neck supple.  Cardiovascular: Normal rate,  regular rhythm.  Pulmonary/chest: Effort normal and breath sounds normal. No wheezing, rales or rhonchi. Abdominal: Soft, nondistended, nontender. There are no masses palpable. No hepatomegaly. Extremities: no edema Lymphadenopathy: No cervical adenopathy noted. Neurological: Alert and oriented to person place and time. Skin: Skin is warm and dry. No rashes noted. Psychiatric: Normal mood and affect. Behavior is normal.   ASSESSMENT AND PLAN: 52 year old female with a history of congestive heart failure, here for new patient assessment of the following issues:  Colon cancer screening -  she is average risk for colon cancer , and is overdue for screening. I discussed options with her to include optical colonoscopy versus stool based testing. I discussed the risks and benefits of each of these tests with her at length. She reports her cardiopulmonary symptoms are stable at this time. Following discussion of this issue, while she is higher than average risk for anesthesia related complications, her preference is to proceed with optical colonoscopy for screening. I think is reasonable given her symptoms are stable. Her case will need to be done at the hospital for anesthesia assistance in light of her low ejection fraction. Further recommendations pending the results of the exam.  Elevated liver enzymes - recently admitted with sepsis secondary to UTI. She had elevated liver enzymes which down trended with therapy of her infection. Suspect this could've been due to hypotension in the setting of infection. Will repeat LFTs with baseline hepatitis C and B studies to ensure negative. If LFT elevation persists over time will need further serologic evaluation.  [place] Cellar, MD [facility]

## 2017-09-14 NOTE — Patient Instructions (Addendum)
If you are age 52 or older, your body mass index should be between 23-30. Your Body mass index is 33.47 kg/m. If this is out of the aforementioned range listed, please consider follow up with your Primary Care Provider.  If you are age 41 or younger, your body mass index should be between 19-25. Your Body mass index is 33.47 kg/m. If this is out of the aformentioned range listed, please consider follow up with your Primary Care Provider.   You have been scheduled for a colonoscopy. Please follow written instructions given to you at your visit today.  Please pick up your prep supplies at the pharmacy within the next 1-3 days. If you use inhalers (even only as needed), please bring them with you on the day of your procedure. Your physician has requested that you go to www.startemmi.com and enter the access code given to you at your visit today. This web site gives a general overview about your procedure. However, you should still follow specific instructions given to you by our office regarding your preparation for the procedure.  Please go to the lab in the basement of our building to have lab work done in the next couple of weeks.  Thank you for entrusting me with your care and for choosing [facility], Dr. [place] Cellar

## 2017-09-17 ENCOUNTER — Telehealth: Payer: Self-pay | Admitting: Cardiology

## 2017-09-17 NOTE — Telephone Encounter (Signed)
REPORTED  VITAL SIGN TODAY  RESTING HEART RATE 103 , B/P 140/100.   NO SHORTNESS OF BREATHE . PATIENT HAD NOT BEEN TAKING CLONIDINE SE THOUGHT OT WAS ON AS NEED BASES.  - [facility].   RN  INFORMED  JIM - TEL PATIENT TAKE DAILY AS DIRECTED CONTINUE TO MONITOR AND KEEP UPCOMING APPOINTMENT WITH PRIMARY AND CARDILOGIST   JIM VERBALIZED  WILL TELL PATIENT

## 2017-09-17 NOTE — Telephone Encounter (Signed)
New Message:    He is at the pt's home, wants to talk to somebody while he is there.

## 2017-09-17 NOTE — Telephone Encounter (Signed)
LEFT MESSAGE FOR - JIM (THERAPIST )TO CALL BACK.

## 2017-09-24 ENCOUNTER — Other Ambulatory Visit: Payer: Self-pay

## 2017-09-24 ENCOUNTER — Encounter (HOSPITAL_COMMUNITY): Payer: Self-pay | Admitting: Emergency Medicine

## 2017-09-25 ENCOUNTER — Encounter: Payer: Self-pay | Admitting: Physician Assistant

## 2017-09-25 ENCOUNTER — Ambulatory Visit: Payer: Medicaid Other | Admitting: Physician Assistant

## 2017-09-25 VITALS — BP 126/74 | HR 101 | Ht 62.0 in | Wt 184.6 lb

## 2017-09-25 DIAGNOSIS — I10 Essential (primary) hypertension: Secondary | ICD-10-CM | POA: Diagnosis not present

## 2017-09-25 DIAGNOSIS — E7849 Other hyperlipidemia: Secondary | ICD-10-CM

## 2017-09-25 DIAGNOSIS — Z95810 Presence of automatic (implantable) cardiac defibrillator: Secondary | ICD-10-CM

## 2017-09-25 DIAGNOSIS — I5042 Chronic combined systolic (congestive) and diastolic (congestive) heart failure: Secondary | ICD-10-CM | POA: Diagnosis not present

## 2017-09-25 DIAGNOSIS — I340 Nonrheumatic mitral (valve) insufficiency: Secondary | ICD-10-CM | POA: Diagnosis not present

## 2017-09-25 DIAGNOSIS — R079 Chest pain, unspecified: Secondary | ICD-10-CM | POA: Diagnosis not present

## 2017-09-25 LAB — BASIC METABOLIC PANEL
BUN / CREAT RATIO: 14 (ref 9–23)
BUN: 16 mg/dL (ref 6–24)
CHLORIDE: 103 mmol/L (ref 96–106)
CO2: 24 mmol/L (ref 20–29)
CREATININE: 1.17 mg/dL — AB (ref 0.57–1.00)
Calcium: 9.5 mg/dL (ref 8.7–10.2)
GFR calc Af Amer: 62 mL/min/{1.73_m2} (ref >59)
GFR calc non Af Amer: 54 mL/min/{1.73_m2} — ABNORMAL LOW (ref >59)
GLUCOSE: 111 mg/dL — AB (ref 65–99)
POTASSIUM: 4.5 mmol/L (ref 3.5–5.2)
SODIUM: 141 mmol/L (ref 134–144)

## 2017-09-25 MED ORDER — SACUBITRIL-VALSARTAN 24-26 MG PO TABS: 1.0000 | ORAL_TABLET | Freq: Two times a day (BID) | ORAL | 11 refills | Status: DC

## 2017-09-25 NOTE — Progress Notes (Signed)
Cardiology Office Note   Date:  09/25/2017   ID:  Kathy Bruce, DOB Apr 30, 1966, MRN [id number]  PCP:  Hayden Rasmussen, MD  Cardiologist: Dr. Ellyn Hack, 08/23/2011 Rosaria Ferries, PA-C   No chief complaint on file.   History of Present Illness: Kathy Bruce is a 52 y.o. female with a history of CAD (reports she had PCI but never on DAPT) in 2013 in [place], [place] s/p subcu BSci ICD, SEM, and poorly controlled HTN   03/14 office visit, patient needs to be set up with EP for ICD monitoring, Lasix increased to 40 mg daily, no Spironolactone with plans to switch from losartan to Entresto, on Coreg, restart Lipitor, Imdur 30 added Admitted 3/25-3/31/2019 for UTI with sepsis, chest and body pain, history of gout, ESR 120, follow-up with rheumatology and continue steroids, CHF was stable  Kathy Bruce presents for cardiology follow up.  She still has orthopnea, cannot lie flat, but otherwise feels she is doing well. She still wakes with SOB during the night occasionally.    She can walk a block, but then will have to stop for SOB. Her legs are not swelling.  This has improved some since getting out of the hospital.  She gets chest pain at times, 2-3 x month, at rest and with exertion. She takes nitro, usually that helps, occasionally not. Sx will last 5-10 minutes. If sx do not improve w/ nitro>>ER.   She feels she is building her strength back from her sepsis admission.  She has more energy, just seems to run out of breath fairly soon.  She has seen the rheumatologist.  Those symptoms are doing better.   Past Medical History:  Diagnosis Date  . Chronic kidney disease (CKD)   . Coronary artery disease involving native heart with angina pectoris [facility]) 2014   Patient reports MI in 2013 (in [place]) -- 2nd [place] [facility] 2014 in [place], [place] --? stent placement when patient was living in [place]. Need outside records.   . Dilated cardiomyopathy ([place]) 2013   Patient reports this was secondary to MI  and she to have AICD placed.   Marland Kitchen GERD (gastroesophageal reflux disease)   . Gout   . Hypertensive heart disease with chronic combined systolic and diastolic congestive heart failure ([place]) 2013  . ICD (implantable cardioverter-defibrillator) in place, [place], 2013 2013   [facility] in [place], (DR. [place])  . Unspecified mood (affective) disorder [facility])     Past Surgical History:  Procedure Laterality Date  . CARDIAC DEFIBRILLATOR PLACEMENT  [address] Scientific ICD, subcu device, single lead. Implanted in [place] ECHOCARDIOGRAM  06/2017   Mild LV dilation.  Normal thickness.  Mildly reduced EF of 30-35%.  Moderate diffuse HK.  GRII DD.  Moderate-severe MR.  Mild LA dilation     Current Outpatient Medications  Medication Sig Dispense Refill  . allopurinol (ZYLOPRIM) 100 MG tablet Take 1 tablet (100 mg total) by mouth 2 (two) times daily. 60 tablet 0  . aspirin EC 81 MG tablet Take 1 tablet (81 mg total) by mouth daily. 30 tablet 2  . atorvastatin (LIPITOR) 40 MG tablet Take 1 tablet (40 mg total) by mouth daily. 30 tablet 11  . baclofen (LIORESAL) 10 MG tablet Take 10 mg by mouth 3 (three) times daily.    . carvedilol (COREG) 12.5 MG tablet Take 1 tablet (12.5 mg total) by mouth 2 (two) times daily with a meal. 60 tablet 2  .  cloNIDine (CATAPRES) 0.1 MG tablet Take 1 tablet (0.1 mg total) by mouth daily. 60 tablet 11  . colchicine 0.6 MG tablet Take 1 tablet (0.6 mg total) by mouth 2 (two) times daily. 20 tablet 0  . furosemide (LASIX) 40 MG tablet Take 1 tablet (40 mg total) by mouth daily. 30 tablet 11  . HYDROcodone-acetaminophen (NORCO/VICODIN) 5-325 MG tablet Take 1 tablet by mouth every 4 (four) hours as needed for severe pain. 20 tablet 0  . isosorbide mononitrate (IMDUR) 30 MG 24 hr tablet Take 1 tablet (30 mg total) by mouth daily. 30 tablet 11  . naproxen (NAPROSYN) 500 MG tablet Take 1 tablet (500 mg total) by  mouth 2 (two) times daily with a meal. 60 tablet 0  . nitroGLYCERIN (NITROSTAT) 0.4 MG SL tablet Place 0.4 mg under the tongue every 5 (five) minutes as needed for chest pain.    Marland Kitchen omeprazole (PRILOSEC) 40 MG capsule Take 1 capsule (40 mg total) by mouth daily. 30 capsule 2  . predniSONE (DELTASONE) 10 MG tablet Take 1 tablet (10 mg total) by mouth daily with breakfast. 30 tablet 1  . predniSONE (DELTASONE) 5 MG tablet Take 1 tablet (5 mg total) by mouth daily with breakfast.    . SUPREP BOWEL PREP KIT 17.5-3.13-1.6 GM/177ML SOLN Suprep-Use as directed 354 mL 0  . tetrahydrozoline (VISINE) 0.05 % ophthalmic solution Place 2 drops into both eyes daily as needed (DRY EYES). 15 mL 0  . zolpidem (AMBIEN) 10 MG tablet Take 10 mg by mouth at bedtime.   0   No current facility-administered medications for this visit.     Allergies:   Patient has no known allergies.    Social History:  The patient  reports that she has never smoked. She has never used smokeless tobacco. She reports that she drinks alcohol. She reports that she does not use drugs.   Family History:  The patient's family history includes Arthritis in her sister; Diabetes in her daughter and mother; Gout in her son; Heart attack in her brother; Hypertension in her brother, sister, and son; Hyperthyroidism in her brother and mother; Tuberculosis in her father.    ROS:  Please see the history of present illness. All other systems are reviewed and negative.    PHYSICAL EXAM: VS:  BP 126/74 (BP Location: Left Arm)   Pulse (!) 101   Ht 5' 2"  (1.575 m)   Wt 184 lb 9.6 oz (83.7 kg)   SpO2 97%   BMI 33.76 kg/m  , BMI Body mass index is 33.76 kg/m. GEN: Well nourished, well developed, female in no acute distress  HEENT: normal for age  Neck: Minimal JVD, no carotid bruit, no masses Cardiac: RRR; soft murmur, no rubs, or gallops Respiratory:  clear to auscultation bilaterally, normal work of breathing GI: soft, nontender,  nondistended, + BS MS: no deformity or atrophy; no edema; distal pulses are 2+ in all 4 extremities   Skin: warm and dry, no rash Neuro:  Strength and sensation are intact Psych: euthymic mood, full affect   EKG:  EKG is not ordered today.  [employer] scientist: 09/25/2017 She has a subcu device, per [employer] with [employer], special equipment is needed to do the interrogation, the rep has to be present as we do not have the equipment.  The device is an early generation so interrogation by phone is not possible. No high heart rate episodes, the device is set up not to detect unless  the heart rate is over 200 bpm No shocks, electrode impedance status is normal Battery life: 41%  ECHO: 07/03/2017 - Left ventricle: The cavity size was mildly dilated. Wall   thickness was normal. Systolic function was moderately to   severely reduced. The estimated ejection fraction was in the   range of 30% to 35%. Moderate diffuse hypokinesis with no   identifiable regional variations. Features are consistent with a   pseudonormal left ventricular filling pattern, with concomitant   abnormal relaxation and increased filling pressure (grade 2   diastolic dysfunction). - Mitral valve: There was moderate to severe regurgitation directed   centrally. The acceleration rate of the regurgitant jet was   reduced, consistent with a low dP/dt. - Left atrium: The atrium was mildly dilated.  Recent Labs: 09/03/2017: ALT 62 09/05/2017: Hemoglobin 9.1; Platelets 427 09/06/2017: BUN 19; Creatinine, Ser 1.11; Potassium 4.2; Sodium 142    Lipid Panel    Component Value Date/Time   CHOL 256 (H) 07/03/2017 0239   TRIG 187 (H) 07/03/2017 0239   HDL 59 07/03/2017 0239   CHOLHDL 4.3 07/03/2017 0239   VLDL 37 07/03/2017 0239   LDLCALC 160 (H) 07/03/2017 0239     Wt Readings from Last 3 Encounters:  09/25/17 184 lb 9.6 oz (83.7 kg)  09/14/17 183 lb (83 kg)  09/06/17 186 lb (84.4 kg)       Other studies Reviewed: Additional studies/ records that were reviewed today include: Office notes, hospital records and testing.  ASSESSMENT AND PLAN:  1.  Chronic systolic and diastolic CHF: Her weight is stable.  She continues to have some symptoms of heart failure, but does not have significant volume overload on exam.  There is an element of deconditioning, but part of the dyspnea on exertion may also be from her valve.  She is encouraged to continue to try to increase her activity, albeit slowly. -Stop the losartan and start low-dose Entresto -The Pharmacist will see her in 2 weeks for medication titration  2.  Moderate-severe MR: This can be followed with serial echoes. **Dr. Ellyn Hack to review and advise if she needs antibiotic prophylaxis for dental procedures**  3.  [employer] ICD: Device was interrogated by the rep today.  The impedance is good, the battery is at 41%.  Dr. Sallyanne Kuster reviewed the data.  He agrees to manage her ICD.  He states she will need six-month checks until the battery life is less than a year.   4.  Chest pain: She is having some chest pain that she treats with nitroglycerin.  According to her report, she had an MI at the past but they told her it was from stress on her heart due to uncontrolled hypertension and she did not get any stents. **Dr. Ellyn Hack to determine if stress testing or cardiac catheterization is indicated at this time**  5.  Hypertension: Blood pressure is well controlled at this time, but I believe she will tolerate the Entresto  6.  Hyperlipidemia: Continue Lipitor 40 mg daily, recheck lipids and liver in 3 months  Current medicines are reviewed at length with the patient today.  The patient does not have concerns regarding medicines.  The following changes have been made: DC losartan, add Entresto  Labs/ tests ordered today include:   Orders Placed This Encounter  Procedures  . Basic metabolic panel     Disposition:   FU  with Dr. Ellyn Hack and with Dr. Sallyanne Kuster as scheduled   Signed, Rosaria Ferries, PA-C  09/25/2017 1:36 PM    [facility] Phone: [phone]; Fax: [phone]  This note was written with the assistance of speech recognition software. Please excuse any transcriptional errors.

## 2017-09-25 NOTE — Progress Notes (Signed)
Patient seen and examined. S-ICD download reviewed. No VT events, normal battery status. Site is healthy. Will arrange Q6 months device checks. Sanda Klein, MD, [facility] [phone] office [phone] pager

## 2017-09-25 NOTE — Patient Instructions (Addendum)
Medication Instructions: STOP the Losartan START Entresto 24-26 twice daily (samples provided)  If you need a refill on your cardiac medications before your next appointment, please call your pharmacy.   Labwork: Your physician recommends that you have a BMET today  Follow-Up: Your physician wants you to follow-up in 2 weeks with pharmd for medication titration and the 6 months with Dr. Sallyanne Kuster. You will receive a reminder letter in the mail two months in advance. If you don't receive a letter, please call our office at [phone] to schedule this follow-up appointment.   Thank you for choosing [facility] at [facility]!!

## 2017-09-29 NOTE — Progress Notes (Signed)
Last device check 07-07-16  On chart. Requested perioperative prescription form.

## 2017-10-01 NOTE — Progress Notes (Addendum)
Faxed perioperative device orders x 3 times to Dr. Hansel Feinstein in [place] Fax # [phone]. Also called office back regarding this and LVM at [phone]

## 2017-10-05 ENCOUNTER — Ambulatory Visit (HOSPITAL_COMMUNITY): Payer: Medicaid Other | Admitting: Certified Registered Nurse Anesthetist

## 2017-10-05 ENCOUNTER — Other Ambulatory Visit: Payer: Self-pay

## 2017-10-05 ENCOUNTER — Ambulatory Visit (HOSPITAL_COMMUNITY)
Admission: RE | Admit: 2017-10-05 | Discharge: 2017-10-05 | Disposition: A | Discharge status: Home / Self Care | Payer: Medicaid Other | Source: Ambulatory Visit | Attending: Gastroenterology | Admitting: Gastroenterology

## 2017-10-05 ENCOUNTER — Encounter (HOSPITAL_COMMUNITY)
Admission: RE | Disposition: A | Discharge status: Home / Self Care | Payer: Self-pay | Source: Ambulatory Visit | Attending: Gastroenterology

## 2017-10-05 ENCOUNTER — Encounter (HOSPITAL_COMMUNITY): Payer: Self-pay | Admitting: *Deleted

## 2017-10-05 DIAGNOSIS — R748 Abnormal levels of other serum enzymes: Secondary | ICD-10-CM

## 2017-10-05 DIAGNOSIS — Z836 Family history of other diseases of the respiratory system: Secondary | ICD-10-CM | POA: Diagnosis not present

## 2017-10-05 DIAGNOSIS — Z8349 Family history of other endocrine, nutritional and metabolic diseases: Secondary | ICD-10-CM | POA: Insufficient documentation

## 2017-10-05 DIAGNOSIS — E1122 Type 2 diabetes mellitus with diabetic chronic kidney disease: Secondary | ICD-10-CM | POA: Insufficient documentation

## 2017-10-05 DIAGNOSIS — Z833 Family history of diabetes mellitus: Secondary | ICD-10-CM | POA: Diagnosis not present

## 2017-10-05 DIAGNOSIS — I25119 Atherosclerotic heart disease of native coronary artery with unspecified angina pectoris: Secondary | ICD-10-CM | POA: Insufficient documentation

## 2017-10-05 DIAGNOSIS — N189 Chronic kidney disease, unspecified: Secondary | ICD-10-CM | POA: Insufficient documentation

## 2017-10-05 DIAGNOSIS — I429 Cardiomyopathy, unspecified: Secondary | ICD-10-CM | POA: Insufficient documentation

## 2017-10-05 DIAGNOSIS — K219 Gastro-esophageal reflux disease without esophagitis: Secondary | ICD-10-CM | POA: Diagnosis not present

## 2017-10-05 DIAGNOSIS — Z1211 Encounter for screening for malignant neoplasm of colon: Secondary | ICD-10-CM

## 2017-10-05 DIAGNOSIS — I5042 Chronic combined systolic (congestive) and diastolic (congestive) heart failure: Secondary | ICD-10-CM | POA: Insufficient documentation

## 2017-10-05 DIAGNOSIS — Z6833 Body mass index (BMI) 33.0-33.9, adult: Secondary | ICD-10-CM | POA: Insufficient documentation

## 2017-10-05 DIAGNOSIS — Z8261 Family history of arthritis: Secondary | ICD-10-CM | POA: Diagnosis not present

## 2017-10-05 DIAGNOSIS — M109 Gout, unspecified: Secondary | ICD-10-CM | POA: Insufficient documentation

## 2017-10-05 DIAGNOSIS — F39 Unspecified mood [affective] disorder: Secondary | ICD-10-CM | POA: Diagnosis not present

## 2017-10-05 DIAGNOSIS — Z95810 Presence of automatic (implantable) cardiac defibrillator: Secondary | ICD-10-CM | POA: Insufficient documentation

## 2017-10-05 DIAGNOSIS — Z79899 Other long term (current) drug therapy: Secondary | ICD-10-CM | POA: Diagnosis not present

## 2017-10-05 DIAGNOSIS — E669 Obesity, unspecified: Secondary | ICD-10-CM | POA: Diagnosis not present

## 2017-10-05 DIAGNOSIS — Z7982 Long term (current) use of aspirin: Secondary | ICD-10-CM | POA: Insufficient documentation

## 2017-10-05 DIAGNOSIS — I130 Hypertensive heart and chronic kidney disease with heart failure and stage 1 through stage 4 chronic kidney disease, or unspecified chronic kidney disease: Secondary | ICD-10-CM | POA: Insufficient documentation

## 2017-10-05 DIAGNOSIS — I509 Heart failure, unspecified: Secondary | ICD-10-CM

## 2017-10-05 HISTORY — PX: COLONOSCOPY WITH PROPOFOL: SHX5780

## 2017-10-05 SURGERY — COLONOSCOPY WITH PROPOFOL
Anesthesia: Monitor Anesthesia Care

## 2017-10-05 MED ORDER — PROPOFOL 10 MG/ML IV BOLUS
INTRAVENOUS | Status: DC | PRN
  Administered 2017-10-05: 100 mg via INTRAVENOUS
  Administered 2017-10-05 (×2): 50 mg via INTRAVENOUS

## 2017-10-05 MED ORDER — PROPOFOL 10 MG/ML IV BOLUS
INTRAVENOUS | Status: AC
  Filled 2017-10-05: qty 20

## 2017-10-05 MED ORDER — PROPOFOL 500 MG/50ML IV EMUL
INTRAVENOUS | Status: DC | PRN
  Administered 2017-10-05: 100 ug/kg/min via INTRAVENOUS

## 2017-10-05 MED ORDER — LACTATED RINGERS IV SOLN
INTRAVENOUS | Status: DC
Start: 2017-10-05 — End: 2017-10-05
  Administered 2017-10-05: 11:00:00 via INTRAVENOUS
  Administered 2017-10-05: 1000 mL via INTRAVENOUS

## 2017-10-05 MED ORDER — PROPOFOL 10 MG/ML IV BOLUS
INTRAVENOUS | Status: AC
  Filled 2017-10-05: qty 60

## 2017-10-05 MED ORDER — SODIUM CHLORIDE 0.9 % IV SOLN: INTRAVENOUS | Status: DC

## 2017-10-05 SURGICAL SUPPLY — 21 items

## 2017-10-05 NOTE — Transfer of Care (Signed)
Immediate Anesthesia Transfer of Care Note  Patient: Kathy Bruce  Procedure(s) Performed: COLONOSCOPY WITH PROPOFOL (N/A )  Patient Location: PACU and Endoscopy Unit  Anesthesia Type:MAC  Level of Consciousness: awake, alert  and oriented  Airway & Oxygen Therapy: Patient Spontanous Breathing  Post-op Assessment: Report given to RN and Post -op Vital signs reviewed and stable  Post vital signs: Reviewed and stable  Last Vitals:  Vitals Value Taken Time  BP    Temp    Pulse 88 10/05/2017 12:01 PM  Resp 21 10/05/2017 12:01 PM  SpO2 100 % 10/05/2017 12:01 PM  Vitals shown include unvalidated device data.  Last Pain:  Vitals:   10/05/17 1027  TempSrc: Oral  PainSc: 0-No pain         Complications: No apparent anesthesia complications

## 2017-10-05 NOTE — Interval H&P Note (Signed)
History and Physical Interval Note:  10/05/2017 10:18 AM  Kathy Bruce  has presented today for surgery, with the diagnosis of colon ca screening/ CHF  The various methods of treatment have been discussed with the patient and family. After consideration of risks, benefits and other options for treatment, the patient has consented to  Procedure(s): COLONOSCOPY WITH PROPOFOL (N/A) as a surgical intervention .  The patient's history has been reviewed, patient examined, no change in status, stable for surgery.  I have reviewed the patient's chart and labs.  Questions were answered to the patient's satisfaction.     [place]

## 2017-10-05 NOTE — Op Note (Signed)
[facility] Patient Name: Kathy Bruce Procedure Date: 10/05/2017 MRN: [id number] Attending MD: Carlota Raspberry. Armbruster MD, MD Date of Birth: Mar 22, 1966 CSN: [id number] Age: 52 Admit Type: Outpatient Procedure:                Colonoscopy Indications:              Screening for colorectal malignant neoplasm, This                            is the patient's first colonoscopy Providers:                Carlota Raspberry. Armbruster MD, MD, Cleda Daub, RN,                            Cherylynn Ridges, Technician, Stephanie [place] CRNA Referring MD:              Medicines:                Monitored Anesthesia Care Complications:            No immediate complications. Estimated blood loss:                            None. Estimated Blood Loss:     Estimated blood loss: none. Procedure:                Pre-Anesthesia Assessment:                           - Prior to the procedure, a History and Physical                            was performed, and patient medications and                            allergies were reviewed. The patient's tolerance of                            previous anesthesia was also reviewed. The risks                            and benefits of the procedure and the sedation                            options and risks were discussed with the patient.                            All questions were answered, and informed consent                            was obtained. Prior Anticoagulants: The patient has                            taken no previous anticoagulant or antiplatelet  agents. ASA Grade Assessment: III - A patient with                            severe systemic disease. After reviewing the risks                            and benefits, the patient was deemed in                            satisfactory condition to undergo the procedure.                           After obtaining informed consent, the colonoscope           was passed under direct vision. Throughout the                            procedure, the patient's blood pressure, pulse, and                            oxygen saturations were monitored continuously. The                            EC-[id number] ([id number]) scope was introduced through                            the anus and advanced to the the cecum, identified                            by appendiceal orifice and ileocecal valve. The                            colonoscopy was performed without difficulty. The                            patient tolerated the procedure well. The quality                            of the bowel preparation was good. The ileocecal                            valve, appendiceal orifice, and rectum were                            photographed. Scope In: 11:30:55 AM Scope Out: 11:54:22 AM Scope Withdrawal Time: 0 hours 17 minutes 26 seconds  Total Procedure Duration: 0 hours 23 minutes 27 seconds  Findings:      The perianal and digital rectal examinations were normal.      The entire examined colon appeared normal on direct and retroflexion       views. Impression:               - The entire examined colon is normal on direct and  retroflexion views.                           - No polyps Moderate Sedation:      No moderate sedation, case performed with MAC Recommendation:           - Patient has a contact number available for                            emergencies. The signs and symptoms of potential                            delayed complications were discussed with the                            patient. Return to normal activities tomorrow.                            Written discharge instructions were provided to the                            patient.                           - Resume previous diet.                           - Continue present medications.                           - Repeat colonoscopy in 10 years for  screening                            purposes. Procedure Code(s):        --- Professional ---                           [phone], Colonoscopy, flexible; diagnostic, including                            collection of specimen(s) by brushing or washing,                            when performed (separate procedure) Diagnosis Code(s):        --- Professional ---                           Z12.11, Encounter for screening for malignant                            neoplasm of colon CPT copyright 2017 American Medical Association. All rights reserved. The codes documented in this report are preliminary and upon coder review may  be revised to meet current compliance requirements. Remo Lipps P. Armbruster MD, MD 10/05/2017 11:58:59 AM This report has been signed electronically. Number of Addenda: 0

## 2017-10-05 NOTE — Anesthesia Postprocedure Evaluation (Signed)
Anesthesia Post Note  Patient: Kathy Bruce  Procedure(s) Performed: COLONOSCOPY WITH PROPOFOL (N/A )     Patient location during evaluation: PACU Anesthesia Type: MAC Level of consciousness: awake and alert Pain management: pain level controlled Vital Signs Assessment: post-procedure vital signs reviewed and stable Respiratory status: spontaneous breathing, nonlabored ventilation and respiratory function stable Cardiovascular status: stable and blood pressure returned to baseline Postop Assessment: no apparent nausea or vomiting Anesthetic complications: no    Last Vitals:  Vitals:   10/05/17 1220 10/05/17 1227  BP: (!) 194/112   Pulse: 67 78  Resp: 18 18  Temp:    SpO2: 100% 100%    Last Pain:  Vitals:   10/05/17 1227  TempSrc:   PainSc: 0-No pain                 Lynda Rainwater

## 2017-10-05 NOTE — Discharge Instructions (Signed)
YOU HAD AN ENDOSCOPIC PROCEDURE TODAY: Refer to the procedure report and other information in the discharge instructions given to you for any specific questions about what was found during the examination. If this information does not answer your questions, please call [place] office at [phone] to clarify.  ° °YOU SHOULD EXPECT: Some feelings of bloating in the abdomen. Passage of more gas than usual. Walking can help get rid of the air that was put into your GI tract during the procedure and reduce the bloating. If you had a lower endoscopy (such as a colonoscopy or flexible sigmoidoscopy) you may notice spotting of blood in your stool or on the toilet paper. Some abdominal soreness may be present for a day or two, also. ° °DIET: Your first meal following the procedure should be a light meal and then it is ok to progress to your normal diet. A half-sandwich or bowl of soup is an example of a good first meal. Heavy or fried foods are harder to digest and may make you feel nauseous or bloated. Drink plenty of fluids but you should avoid alcoholic beverages for 24 hours. If you had a esophageal dilation, please see attached instructions for diet.   ° °ACTIVITY: Your care partner should take you home directly after the procedure. You should plan to take it easy, moving slowly for the rest of the day. You can resume normal activity the day after the procedure however YOU SHOULD NOT DRIVE, use power tools, machinery or perform tasks that involve climbing or major physical exertion for 24 hours (because of the sedation medicines used during the test).  ° °SYMPTOMS TO REPORT IMMEDIATELY: °A gastroenterologist can be reached at any hour. Please call [phone]  for any of the following symptoms:  °Following lower endoscopy (colonoscopy, flexible sigmoidoscopy) °Excessive amounts of blood in the stool  °Significant tenderness, worsening of abdominal pains  °Swelling of the abdomen that is new, acute  °Fever of 100° or  higher  °Following upper endoscopy (EGD, EUS, ERCP, esophageal dilation) °Vomiting of blood or coffee ground material  °New, significant abdominal pain  °New, significant chest pain or pain under the shoulder blades  °Painful or persistently difficult swallowing  °New shortness of breath  °Black, tarry-looking or red, bloody stools ° °FOLLOW UP:  °If any biopsies were taken you will be contacted by phone or by letter within the next 1-3 weeks. Call [phone]  if you have not heard about the biopsies in 3 weeks.  °Please also call with any specific questions about appointments or follow up tests. ° °

## 2017-10-05 NOTE — Anesthesia Preprocedure Evaluation (Signed)
Anesthesia Evaluation  Patient identified by MRN, date of birth, ID band Patient awake    Reviewed: Allergy & Precautions, NPO status , Patient's Chart, lab work & pertinent test results  Airway Mallampati: II  TM Distance: >3 FB Neck ROM: Full    Dental no notable dental hx.    Pulmonary neg pulmonary ROS,    Pulmonary exam normal breath sounds clear to auscultation       Cardiovascular hypertension, + angina + CAD and +CHF  negative cardio ROS Normal cardiovascular exam Rhythm:Regular Rate:Normal     Neuro/Psych negative neurological ROS  negative psych ROS   GI/Hepatic negative GI ROS, Neg liver ROS, GERD  ,  Endo/Other  negative endocrine ROS  Renal/GU Renal InsufficiencyRenal diseasenegative Renal ROS  negative genitourinary   Musculoskeletal negative musculoskeletal ROS (+)   Abdominal (+) + obese,   Peds negative pediatric ROS (+)  Hematology negative hematology ROS (+)   Anesthesia Other Findings   Reproductive/Obstetrics negative OB ROS                             Anesthesia Physical Anesthesia Plan  ASA: III  Anesthesia Plan: MAC   Post-op Pain Management:    Induction: Intravenous  PONV Risk Score and Plan: 2 and Treatment may vary due to age or medical condition  Airway Management Planned: Simple Face Mask  Additional Equipment:   Intra-op Plan:   Post-operative Plan:   Informed Consent: I have reviewed the patients History and Physical, chart, labs and discussed the procedure including the risks, benefits and alternatives for the proposed anesthesia with the patient or authorized representative who has indicated his/her understanding and acceptance.   Dental advisory given  Plan Discussed with: CRNA  Anesthesia Plan Comments:         Anesthesia Quick Evaluation

## 2017-10-06 ENCOUNTER — Encounter (HOSPITAL_COMMUNITY): Payer: Self-pay | Admitting: Gastroenterology

## 2017-10-07 ENCOUNTER — Telehealth: Payer: Self-pay

## 2017-10-07 NOTE — Telephone Encounter (Signed)
Great - thanks

## 2017-10-07 NOTE — Telephone Encounter (Signed)
Called and spoke to pt.  She will go to the lab either Thursday or Friday of this week.

## 2017-10-07 NOTE — Telephone Encounter (Signed)
-----   Message from Yetta Flock, MD sent at 10/06/2017  1:11 PM EDT ----- Regarding: labs Hi Jan, This patient never had her labs drawn I had previously ordered (LFTs, viral hep studies). Can you ask her to go to the lab at some point to have these done? Thanks

## 2017-10-09 ENCOUNTER — Other Ambulatory Visit (INDEPENDENT_AMBULATORY_CARE_PROVIDER_SITE_OTHER): Payer: Medicaid Other

## 2017-10-09 DIAGNOSIS — I509 Heart failure, unspecified: Secondary | ICD-10-CM | POA: Diagnosis not present

## 2017-10-09 DIAGNOSIS — R748 Abnormal levels of other serum enzymes: Secondary | ICD-10-CM

## 2017-10-09 DIAGNOSIS — Z1211 Encounter for screening for malignant neoplasm of colon: Secondary | ICD-10-CM

## 2017-10-09 LAB — HEPATIC FUNCTION PANEL
ALT: 35 U/L (ref 0–35)
AST: 32 U/L (ref 0–37)
Albumin: 4.4 g/dL (ref 3.5–5.2)
Alkaline Phosphatase: 97 U/L (ref 39–117)
BILIRUBIN DIRECT: 0.1 mg/dL (ref 0.0–0.3)
BILIRUBIN TOTAL: 0.4 mg/dL (ref 0.2–1.2)
Total Protein: 7.7 g/dL (ref 6.0–8.3)

## 2017-10-12 ENCOUNTER — Other Ambulatory Visit: Payer: Self-pay | Admitting: *Deleted

## 2017-10-12 DIAGNOSIS — R945 Abnormal results of liver function studies: Principal | ICD-10-CM

## 2017-10-12 DIAGNOSIS — R7989 Other specified abnormal findings of blood chemistry: Secondary | ICD-10-CM

## 2017-10-12 LAB — HEPATITIS B SURFACE ANTIGEN: HEP B S AG: NONREACTIVE

## 2017-10-12 LAB — HEPATITIS C ANTIBODY
Hepatitis C Ab: NONREACTIVE
SIGNAL TO CUT-OFF: 0.02 (ref <1.00)

## 2017-10-15 ENCOUNTER — Ambulatory Visit (INDEPENDENT_AMBULATORY_CARE_PROVIDER_SITE_OTHER): Payer: Medicaid Other | Admitting: Pharmacist Clinician (PhC)/ Clinical Pharmacy Specialist

## 2017-10-15 ENCOUNTER — Encounter: Payer: Self-pay | Admitting: Pharmacist Clinician (PhC)/ Clinical Pharmacy Specialist

## 2017-10-15 DIAGNOSIS — I110 Hypertensive heart disease with heart failure: Secondary | ICD-10-CM

## 2017-10-15 DIAGNOSIS — I5042 Chronic combined systolic (congestive) and diastolic (congestive) heart failure: Secondary | ICD-10-CM

## 2017-10-15 NOTE — Assessment & Plan Note (Signed)
Patient has now been on Entresto 24/26 mg bid for about 2 weeks.  She reports no problems and is doing well.  We will increase her dose to 49/51 mg twice daily and have her return in 2 weeks.   She was given samples and told not to pick up her waiting prescription until we determined the best dose for her.  Because her home readings appear to be much higher than office, I asked that she bring her home cuff to follow up appointment so we can determine its accuracy.

## 2017-10-15 NOTE — Patient Instructions (Signed)
Return for a a follow up appointment in 2 weeks  Your blood pressure today is 126/82  Check your blood pressure at home daily and keep record of the readings.  Take your BP meds as follows:  Increase Entresto to 49/51 mg twice daily.    Continue with all other medications.  Bring your BP cuff and your record of home blood pressures to your next appointment.  Exercise as you're able, try to walk approximately 30 minutes per day.  Keep salt intake to a minimum, especially watch canned and prepared boxed foods.  Eat more fresh fruits and vegetables and fewer canned items.  Avoid eating in fast food restaurants.    HOW TO TAKE YOUR BLOOD PRESSURE: . Rest 5 minutes before taking your blood pressure. .  Don't smoke or drink caffeinated beverages for at least 30 minutes before. . Take your blood pressure before (not after) you eat. . Sit comfortably with your back supported and both feet on the floor (don't cross your legs). . Elevate your arm to heart level on a table or a desk. . Use the proper sized cuff. It should fit smoothly and snugly around your bare upper arm. There should be enough room to slip a fingertip under the cuff. The bottom edge of the cuff should be 1 inch above the crease of the elbow. . Ideally, take 3 measurements at one sitting and record the average.

## 2017-10-15 NOTE — Progress Notes (Signed)
10/15/2017 Kathy Bruce May 22, 1966 [id number]   HPI:  Kathy Bruce is a 52 y.o. female patient of Dr. Ellyn Hack , with a PMH below who presents today for heart failure medication titration.  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    She was previously on losartan, but was switched to [facility] two weeks ago when she was here to see Rosaria Ferries.  She reports feeling fine today, no side effects or concerns about the new medication.     Blood Pressure Goal:  130/80  Current Medications:  Carvedilol 12.5 mg bid  Clonidine 0.1 mg qd  Furosemide 40 mg qd  Isosorbide mono 30 mg qd  Sacubitril/valsartan 24/26 - bid  Family Hx:  Mother - died at 47 from MI  Brother - died at 76 from MI  3 other siblings, no known issues  No contact with father  35-34-30 - 2 with htn, 1 with dm  Social Hx:  No tobacco, occasional alcohol; no regular caffeine  Diet:  Home cooked, no added salt to meals  Exercise:  Does walking, squats and stretching exercises - most days of the week  Home BP readings:  Home readings running 160's/100 - cuff is several years old.    Intolerances:   nkda  Labs:    09/2017:  Na 141, K 4.5, Glu 111, BUN 16, SCr 1.17  Wt Readings from Last 3 Encounters:  09/25/17 184 lb 9.6 oz (83.7 kg)  09/14/17 183 lb (83 kg)  09/06/17 186 lb (84.4 kg)   BP Readings from Last 3 Encounters:  10/15/17 126/80  10/05/17 (!) 194/112  09/25/17 126/74   Pulse Readings from Last 3 Encounters:  10/15/17 92  10/05/17 78  09/25/17 (!) 101    Current Outpatient Medications  Medication Sig Dispense Refill  . allopurinol (ZYLOPRIM) 100 MG tablet Take 1 tablet (100 mg total) by mouth 2 (two) times daily. 60 tablet 0  . aspirin EC 81 MG tablet Take 1 tablet (81 mg total) by mouth daily. 30 tablet 2  .  aspirin-acetaminophen-caffeine (EXCEDRIN MIGRAINE) 381-017-51 MG tablet Take 2 tablets by mouth daily as needed for headache or migraine.    Marland Kitchen atorvastatin (LIPITOR) 40 MG tablet Take 1 tablet (40 mg total) by mouth daily. 30 tablet 11  . baclofen (LIORESAL) 10 MG tablet Take 10 mg by mouth 3 (three) times daily.    . carvedilol (COREG) 12.5 MG tablet Take 1 tablet (12.5 mg total) by mouth 2 (two) times daily with a meal. 60 tablet 2  . cloNIDine (CATAPRES) 0.1 MG tablet Take 1 tablet (0.1 mg total) by mouth daily. 60 tablet 11  . colchicine 0.6 MG tablet Take 1 tablet (0.6 mg total) by mouth 2 (two) times daily. (Patient taking differently: Take 0.6 mg by mouth daily. ) 20 tablet 0  . furosemide (LASIX) 40 MG tablet Take 1 tablet (40 mg total) by mouth daily. 30 tablet 11  . isosorbide mononitrate (IMDUR) 30 MG 24 hr tablet Take 1 tablet (30 mg total) by mouth daily. 30 tablet 11  . Multiple Vitamins-Minerals (MULTIVITAMIN GUMMIES ADULT) CHEW Chew 2 capsules by mouth daily.    . naproxen (NAPROSYN) 500 MG tablet Take 1 tablet (500 mg total) by mouth 2 (two) times daily with a meal. 60 tablet 0  . nitroGLYCERIN (NITROSTAT) 0.4 MG SL tablet Place 0.4 mg  under the tongue every 5 (five) minutes as needed for chest pain.    . Omega-3 Fatty Acids (FISH OIL PO) Take 1 capsule by mouth once a week.    Marland Kitchen omeprazole (PRILOSEC) 40 MG capsule Take 1 capsule (40 mg total) by mouth daily. 30 capsule 2  . OVER THE COUNTER MEDICATION Take 1 capsule by mouth once a week. Triflex otc supplement    . oxyCODONE-acetaminophen (PERCOCET) 7.5-325 MG tablet Take 1 tablet by mouth 3 (three) times daily as needed for severe pain.    . predniSONE (DELTASONE) 10 MG tablet Take 1 tablet (10 mg total) by mouth daily with breakfast. 30 tablet 1  . sacubitril-valsartan (ENTRESTO) 24-26 MG Take 1 tablet by mouth 2 (two) times daily. (Patient taking differently: Take 1 tablet by mouth daily. ) 60 tablet 11  . SUPREP BOWEL PREP KIT  17.5-3.13-1.6 GM/177ML SOLN Suprep-Use as directed 354 mL 0  . tetrahydrozoline (VISINE) 0.05 % ophthalmic solution Place 2 drops into both eyes daily as needed (DRY EYES). 15 mL 0  . zolpidem (AMBIEN) 10 MG tablet Take 10 mg by mouth at bedtime as needed for sleep.   0   No current facility-administered medications for this visit.     No Known Allergies  Past Medical History:  Diagnosis Date  . Chronic kidney disease (CKD)   . Coronary artery disease involving native heart with angina pectoris [facility]) 2014   Patient reports MI in 2013 (in [place]) -- 2nd [place] 2014 in [place], [place] --? stent placement when patient was living in [place]. Need outside records.   . Dilated cardiomyopathy ([place]) 2013   Patient reports this was secondary to MI and she to have AICD placed.   Marland Kitchen GERD (gastroesophageal reflux disease)   . Gout   . Hypertensive heart disease with chronic combined systolic and diastolic congestive heart failure ([place]) 2013  . ICD (implantable cardioverter-defibrillator) in place, [place], 2013 2013   [facility] in [place], (DR. [place])  . Unspecified mood (affective) disorder (HCC)     Blood pressure 126/80, pulse 92.  Hypertensive heart disease with chronic combined systolic and diastolic congestive heart failure ([place]) Patient has now been on Entresto 24/26 mg bid for about 2 weeks.  She reports no problems and is doing well.  We will increase her dose to 49/51 mg twice daily and have her return in 2 weeks.   She was given samples and told not to pick up her waiting prescription until we determined the best dose for her.  Because her home readings appear to be much higher than office, I asked that she bring her home cuff to follow up appointment so we can determine its accuracy.     Tommy Medal PharmD CPP [facility] [address] [place], [place] [address] [phone]

## 2017-10-16 ENCOUNTER — Telehealth: Payer: Self-pay | Admitting: Cardiology

## 2017-10-16 NOTE — Telephone Encounter (Signed)
Spoke with jim, he was calling to give [place] the patients vital signs today. She is now discharged from PT. Will make the pharm md aware.

## 2017-10-16 NOTE — Telephone Encounter (Signed)
New message   Discharged from [place] [facility] today per Jim  HR 110 BP- 110/80 seated 90/80 standing, with mild dizziness Low energy  [phone] Clair Gulling

## 2017-10-16 NOTE — Telephone Encounter (Signed)
Left message for pt to call.

## 2017-10-20 NOTE — Telephone Encounter (Signed)
Spoke with patient.  She reports only the one episode of dizziness.  Otherwise feeling fine.  She should continue with all medications and keep her appointment with [facility] for next week

## 2017-10-21 ENCOUNTER — Other Ambulatory Visit: Payer: Self-pay

## 2017-10-21 ENCOUNTER — Telehealth: Payer: Self-pay | Admitting: Cardiology

## 2017-10-21 ENCOUNTER — Encounter (HOSPITAL_COMMUNITY): Payer: Self-pay

## 2017-10-21 ENCOUNTER — Emergency Department (HOSPITAL_COMMUNITY)
Admission: EM | Admit: 2017-10-21 | Discharge: 2017-10-22 | Disposition: A | Discharge status: Home / Self Care | Payer: Medicaid Other | Attending: Emergency Medicine | Admitting: Emergency Medicine

## 2017-10-21 DIAGNOSIS — R079 Chest pain, unspecified: Secondary | ICD-10-CM | POA: Diagnosis present

## 2017-10-21 DIAGNOSIS — Z5321 Procedure and treatment not carried out due to patient leaving prior to being seen by health care provider: Secondary | ICD-10-CM | POA: Diagnosis not present

## 2017-10-21 LAB — CBC
HEMATOCRIT: 38.2 % (ref 36.0–46.0)
Hemoglobin: 12.3 g/dL (ref 12.0–15.0)
MCH: 29.4 pg (ref 26.0–34.0)
MCHC: 32.2 g/dL (ref 30.0–36.0)
MCV: 91.4 fL (ref 78.0–100.0)
Platelets: 275 10*3/uL (ref 150–400)
RBC: 4.18 MIL/uL (ref 3.87–5.11)
RDW: 15.9 % — AB (ref 11.5–15.5)
WBC: 10.5 10*3/uL (ref 4.0–10.5)

## 2017-10-21 LAB — BASIC METABOLIC PANEL
Anion gap: 13 (ref 5–15)
BUN: 46 mg/dL — AB (ref 6–20)
CHLORIDE: 100 mmol/L — AB (ref 101–111)
CO2: 24 mmol/L (ref 22–32)
CREATININE: 1.55 mg/dL — AB (ref 0.44–1.00)
Calcium: 9.9 mg/dL (ref 8.9–10.3)
GFR calc Af Amer: 44 mL/min — ABNORMAL LOW (ref >60)
GFR calc non Af Amer: 38 mL/min — ABNORMAL LOW (ref >60)
GLUCOSE: 112 mg/dL — AB (ref 65–99)
POTASSIUM: 4.1 mmol/L (ref 3.5–5.1)
Sodium: 137 mmol/L (ref 135–145)

## 2017-10-21 LAB — I-STAT TROPONIN, ED: Troponin i, poc: 0 ng/mL (ref 0.00–0.08)

## 2017-10-21 LAB — I-STAT BETA HCG BLOOD, ED (MC, WL, AP ONLY)

## 2017-10-21 NOTE — Telephone Encounter (Signed)
Left message on voicemail for patient to call back. 

## 2017-10-21 NOTE — Telephone Encounter (Signed)
SPOKE TO PATIENT - RECOMMEND GO TO ER FOR EVALUTION  PATIENT VERBALIZED UNDERSTANDING.

## 2017-10-21 NOTE — ED Notes (Signed)
Pt. Called for xray X3 no response

## 2017-10-21 NOTE — Telephone Encounter (Signed)
Left message on lori's  voice mail recommendation to go to the ER for evaluation.

## 2017-10-21 NOTE — Telephone Encounter (Signed)
Lori/[facility] is calling:  Pt's entresto was increased last week and pt is having dizziness and fell in the bathroom and hit head. Cecille Rubin want to know if pt need to to do the hospital. Pt's blood pressure was 128/78sitting  112/80 standing. Please advise

## 2017-10-21 NOTE — ED Triage Notes (Signed)
Pt reports that she has had L sided CP on and off all day, L sided, some SOB, last night had a syncopal episode and hit head on the bathtub, small hematoma. Pt has [employer] pacemaker, took one nitro PTA with minimal relief.

## 2017-10-26 ENCOUNTER — Telehealth: Payer: Self-pay | Admitting: *Deleted

## 2017-10-26 DIAGNOSIS — E7849 Other hyperlipidemia: Secondary | ICD-10-CM

## 2017-10-26 DIAGNOSIS — I5042 Chronic combined systolic (congestive) and diastolic (congestive) heart failure: Secondary | ICD-10-CM

## 2017-10-26 DIAGNOSIS — I110 Hypertensive heart disease with heart failure: Secondary | ICD-10-CM

## 2017-10-26 NOTE — Telephone Encounter (Signed)
Mailed lab slip and letter 

## 2017-10-26 NOTE — Telephone Encounter (Signed)
-----   Message from Raiford Simmonds, RN sent at 08/20/2017 11:32 AM EDT ----- LABS DUE  June 14,2019 CMP LIPID MAIL@Oct 20 2017

## 2017-10-29 ENCOUNTER — Ambulatory Visit: Payer: Medicaid Other

## 2017-10-29 NOTE — Progress Notes (Deleted)
HPI:  Kathy Bruce is a 52 y.o. female patient of Dr. Ellyn Hack , with a PMH below who presents today for heart failure medication titration.  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    Her Entresto dose was increased from 24/35m to 49/543m two weeks ago when she was here to see RhRosaria Ferries Noted reports of dizziness and .     Blood Pressure Goal:  130/80  Current Medications:  Carvedilol 12.5 mg twice daily  Clonidine 0.1 mg daily  Furosemide 40 mg daily  Isosorbide mono 30 mg daily  Sacubitril/valsartan 49/51 twice daily  Family Hx:  Mother - died at [age]rom [place]  Brother - died at 89rom MI  3 other siblings, no known issues  No contact with father  35-34-30 - 2 with htn, 1 with dm  Social Hx:  No tobacco, occasional alcohol; no regular caffeine  Diet:  Home cooked, no added salt to meals  Exercise:  Does walking, squats and stretching exercises - most days of the week  Home BP readings:  Home readings running 160's/100 - cuff is several years old.    Intolerances:   nkda  Labs:    09/2017:  Na 141, K 4.5, Glu 111, BUN 16, SCr 1.17  Wt Readings from Last 3 Encounters:  09/25/17 184 lb 9.6 oz (83.7 kg)  09/14/17 183 lb (83 kg)  09/06/17 186 lb (84.4 kg)   BP Readings from Last 3 Encounters:  10/21/17 (!) 151/85  10/15/17 126/80  10/05/17 (!) 194/112   Pulse Readings from Last 3 Encounters:  10/21/17 95  10/15/17 92  10/05/17 78    Current Outpatient Medications  Medication Sig Dispense Refill  . allopurinol (ZYLOPRIM) 300 MG tablet Take 300 mg by mouth daily.  0  . aspirin EC 81 MG tablet Take 1 tablet (81 mg total) by mouth daily. 30 tablet 2  . aspirin-acetaminophen-caffeine (EXCEDRIN MIGRAINE) 25270-350-09G tablet Take 2 tablets by mouth daily as needed for headache or migraine.     . Marland Kitchentorvastatin (LIPITOR) 40 MG tablet Take 1 tablet (40 mg total) by mouth daily. 30 tablet 11  . baclofen (LIORESAL) 10 MG tablet Take 10 mg by mouth 3 (three) times daily.    . carvedilol (COREG) 12.5 MG tablet Take 1 tablet (12.5 mg total) by mouth 2 (two) times daily with a meal. 60 tablet 2  . cloNIDine (CATAPRES) 0.1 MG tablet Take 1 tablet (0.1 mg total) by mouth daily. 60 tablet 11  . colchicine 0.6 MG tablet Take 1 tablet (0.6 mg total) by mouth 2 (two) times daily. (Patient taking differently: Take 0.6 mg by mouth daily. ) 20 tablet 0  . furosemide (LASIX) 40 MG tablet Take 1 tablet (40 mg total) by mouth daily. 30 tablet 11  . isosorbide mononitrate (IMDUR) 30 MG 24 hr tablet Take 1 tablet (30 mg total) by mouth daily. 30 tablet 11  . Multiple Vitamins-Minerals (MULTIVITAMIN GUMMIES ADULT) CHEW Chew 2 capsules by mouth daily.    . naproxen (NAPROSYN) 500 MG tablet Take 1 tablet (500 mg total) by mouth 2 (two) times daily with a meal. 60 tablet 0  . nitroGLYCERIN (NITROSTAT) 0.4 MG SL tablet Place 0.4 mg under the tongue every 5 (five) minutes as needed for chest pain.    . Omega-3 Fatty Acids (FISH OIL PO) Take  1 capsule by mouth once a week.    Marland Kitchen omeprazole (PRILOSEC) 40 MG capsule Take 1 capsule (40 mg total) by mouth daily. 30 capsule 2  . OVER THE COUNTER MEDICATION Take 1 capsule by mouth once a week. Triflex otc supplement    . oxyCODONE-acetaminophen (PERCOCET) 7.5-325 MG tablet Take 1 tablet by mouth 3 (three) times daily as needed for severe pain.    . predniSONE (DELTASONE) 10 MG tablet Take 1 tablet (10 mg total) by mouth daily with breakfast. 30 tablet 1  . sacubitril-valsartan (ENTRESTO) 24-26 MG Take 1 tablet by mouth 2 (two) times daily. (Patient not taking: Reported on 10/21/2017) 60 tablet 11  . sacubitril-valsartan (ENTRESTO) 49-51 MG Take 1 tablet by mouth 2 (two) times daily.    Manus Gunning BOWEL PREP KIT 17.5-3.13-1.6 GM/177ML SOLN Suprep-Use as directed (Patient not  taking: Reported on 10/21/2017) 354 mL 0  . tetrahydrozoline (VISINE) 0.05 % ophthalmic solution Place 2 drops into both eyes daily as needed (DRY EYES). 15 mL 0  . zolpidem (AMBIEN) 10 MG tablet Take 10 mg by mouth at bedtime as needed for sleep.   0   No current facility-administered medications for this visit.     No Known Allergies  Past Medical History:  Diagnosis Date  . Chronic kidney disease (CKD)   . Coronary artery disease involving native heart with angina pectoris [facility]) 2014   Patient reports MI in 2013 (in [place]) -- 2nd [place] 2014 in [place], [place] --? stent placement when patient was living in [place]. Need outside records.   . Dilated cardiomyopathy ([place]) 2013   Patient reports this was secondary to MI and she to have AICD placed.   Marland Kitchen GERD (gastroesophageal reflux disease)   . Gout   . 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 ICD (implantable cardioverter-defibrillator) in place, [place], 2013 2013   [facility] in [place], (DR. [place])  . Unspecified mood (affective) disorder (HCC)     There were no vitals taken for this visit.  No problem-specific Assessment & Plan notes found for this encounter.  Tasmin Exantus Rodriguez-Guzman PharmD, BCPS, [place] [address] 10/29/2017 8:26 AM

## 2017-11-13 ENCOUNTER — Ambulatory Visit: Payer: Medicaid Other

## 2017-11-13 NOTE — Progress Notes (Deleted)
HPI:  Kathy Bruce is a 52 y.o. female patient of Dr. Ellyn Hack , with a PMH below who presents today for heart failure medication titration.  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    Her Delene Loll was increased from 24/26 to 49/51 twice daily on 10/15/2017. Patient reports dizziness and some episodes of hypotension. Alao noted recent visit to ER after fall.  She reports feeling fine today, no side effects or concerns about the new medication.     Blood Pressure Goal:  130/80  Current Medications:  Carvedilol 12.5 mg twice daily  Clonidine 0.1 mg daily  Furosemide 40 mg daily  Isosorbide mono 30 mg daily  Sacubitril/valsartan 49/33m  twice daily  Family History:  Mother - died at 69from MI  Brother - died at 386from MI  3 other siblings, no known issues  No contact with father  35-34-30 - 2 with htn, 1 with dm  Social History:  No tobacco, occasional alcohol; no regular caffeine  Diet:  Home cooked, no added salt to meals  Exercise:  Does walking, squats and stretching exercises - most days of the week  Home BP readings:  Home readings running 160's/100 - cuff is several years old.    Intolerances:   nkda  Labs:    09/2017:  Na 141, K 4.5, Glu 111, BUN 16, SCr 1.17  Wt Readings from Last 3 Encounters:  09/25/17 184 lb 9.6 oz (83.7 kg)  09/14/17 183 lb (83 kg)  09/06/17 186 lb (84.4 kg)   BP Readings from Last 3 Encounters:  10/21/17 (!) 151/85  10/15/17 126/80  10/05/17 (!) 194/112   Pulse Readings from Last 3 Encounters:  10/21/17 95  10/15/17 92  10/05/17 78    Current Outpatient Medications  Medication Sig Dispense Refill  . allopurinol (ZYLOPRIM) 300 MG tablet Take 300 mg by mouth daily.  0  . aspirin EC 81 MG tablet Take 1 tablet (81 mg total) by mouth daily. 30 tablet 2  .  aspirin-acetaminophen-caffeine (EXCEDRIN MIGRAINE) 2948-016-55MG tablet Take 2 tablets by mouth daily as needed for headache or migraine.    .Marland Kitchenatorvastatin (LIPITOR) 40 MG tablet Take 1 tablet (40 mg total) by mouth daily. 30 tablet 11  . baclofen (LIORESAL) 10 MG tablet Take 10 mg by mouth 3 (three) times daily.    . carvedilol (COREG) 12.5 MG tablet Take 1 tablet (12.5 mg total) by mouth 2 (two) times daily with a meal. 60 tablet 2  . cloNIDine (CATAPRES) 0.1 MG tablet Take 1 tablet (0.1 mg total) by mouth daily. 60 tablet 11  . colchicine 0.6 MG tablet Take 1 tablet (0.6 mg total) by mouth 2 (two) times daily. (Patient taking differently: Take 0.6 mg by mouth daily. ) 20 tablet 0  . furosemide (LASIX) 40 MG tablet Take 1 tablet (40 mg total) by mouth daily. 30 tablet 11  . isosorbide mononitrate (IMDUR) 30 MG 24 hr tablet Take 1 tablet (30 mg total) by mouth daily. 30 tablet 11  . Multiple Vitamins-Minerals (MULTIVITAMIN GUMMIES ADULT) CHEW Chew 2 capsules by mouth daily.    . naproxen (NAPROSYN) 500 MG tablet Take 1 tablet (500 mg total) by mouth 2 (two) times daily with a meal. 60 tablet 0  . nitroGLYCERIN (NITROSTAT) 0.4 MG SL tablet Place 0.4 mg under the tongue every 5 (five)  minutes as needed for chest pain.    . Omega-3 Fatty Acids (FISH OIL PO) Take 1 capsule by mouth once a week.    Marland Kitchen omeprazole (PRILOSEC) 40 MG capsule Take 1 capsule (40 mg total) by mouth daily. 30 capsule 2  . OVER THE COUNTER MEDICATION Take 1 capsule by mouth once a week. Triflex otc supplement    . oxyCODONE-acetaminophen (PERCOCET) 7.5-325 MG tablet Take 1 tablet by mouth 3 (three) times daily as needed for severe pain.    . predniSONE (DELTASONE) 10 MG tablet Take 1 tablet (10 mg total) by mouth daily with breakfast. 30 tablet 1  . sacubitril-valsartan (ENTRESTO) 24-26 MG Take 1 tablet by mouth 2 (two) times daily. (Patient not taking: Reported on 10/21/2017) 60 tablet 11  . sacubitril-valsartan (ENTRESTO) 49-51  MG Take 1 tablet by mouth 2 (two) times daily.    Manus Gunning BOWEL PREP KIT 17.5-3.13-1.6 GM/177ML SOLN Suprep-Use as directed (Patient not taking: Reported on 10/21/2017) 354 mL 0  . tetrahydrozoline (VISINE) 0.05 % ophthalmic solution Place 2 drops into both eyes daily as needed (DRY EYES). 15 mL 0  . zolpidem (AMBIEN) 10 MG tablet Take 10 mg by mouth at bedtime as needed for sleep.   0   No current facility-administered medications for this visit.     No Known Allergies  Past Medical History:  Diagnosis Date  . Chronic kidney disease (CKD)   . Coronary artery disease involving native heart with angina pectoris [facility]) 2014   Patient reports MI in 2013 (in [place]) -- 2nd [place] 2014 in [place], [place] --? stent placement when patient was living in [place]. Need outside records.   . Dilated cardiomyopathy ([place]) 2013   Patient reports this was secondary to MI and she to have AICD placed.   Marland Kitchen GERD (gastroesophageal reflux disease)   . Gout   . Hypertensive heart disease with chronic combined systolic and diastolic congestive heart failure ([place]) 2013  . ICD (implantable cardioverter-defibrillator) in place, [place], 2013 2013   [facility] in [place], (DR. [place])  . Unspecified mood (affective) disorder (HCC)     There were no vitals taken for this visit.  No problem-specific Assessment & Plan notes found for this encounter.  Thompson Mckim Rodriguez-Guzman PharmD, BCPS, [place] [address] 11/13/2017 8:52 AM

## 2017-11-18 ENCOUNTER — Emergency Department (HOSPITAL_COMMUNITY): Payer: Medicaid Other

## 2017-11-18 ENCOUNTER — Encounter (HOSPITAL_COMMUNITY): Payer: Self-pay | Admitting: Emergency Medicine

## 2017-11-18 ENCOUNTER — Emergency Department (HOSPITAL_COMMUNITY)
Admission: EM | Admit: 2017-11-18 | Discharge: 2017-11-18 | Disposition: A | Discharge status: Home / Self Care | Payer: Medicaid Other | Attending: Emergency Medicine | Admitting: Emergency Medicine

## 2017-11-18 DIAGNOSIS — Z7982 Long term (current) use of aspirin: Secondary | ICD-10-CM | POA: Diagnosis not present

## 2017-11-18 DIAGNOSIS — R079 Chest pain, unspecified: Secondary | ICD-10-CM | POA: Diagnosis not present

## 2017-11-18 DIAGNOSIS — M109 Gout, unspecified: Secondary | ICD-10-CM

## 2017-11-18 DIAGNOSIS — N189 Chronic kidney disease, unspecified: Secondary | ICD-10-CM | POA: Insufficient documentation

## 2017-11-18 DIAGNOSIS — I130 Hypertensive heart and chronic kidney disease with heart failure and stage 1 through stage 4 chronic kidney disease, or unspecified chronic kidney disease: Secondary | ICD-10-CM | POA: Diagnosis not present

## 2017-11-18 DIAGNOSIS — Z79899 Other long term (current) drug therapy: Secondary | ICD-10-CM | POA: Diagnosis not present

## 2017-11-18 DIAGNOSIS — M79642 Pain in left hand: Secondary | ICD-10-CM | POA: Insufficient documentation

## 2017-11-18 DIAGNOSIS — I159 Secondary hypertension, unspecified: Secondary | ICD-10-CM

## 2017-11-18 DIAGNOSIS — R000 Tachycardia, unspecified: Secondary | ICD-10-CM

## 2017-11-18 DIAGNOSIS — I5042 Chronic combined systolic (congestive) and diastolic (congestive) heart failure: Secondary | ICD-10-CM | POA: Diagnosis not present

## 2017-11-18 DIAGNOSIS — R0789 Other chest pain: Secondary | ICD-10-CM

## 2017-11-18 LAB — BASIC METABOLIC PANEL
ANION GAP: 10 (ref 5–15)
BUN: 24 mg/dL — ABNORMAL HIGH (ref 6–20)
CO2: 24 mmol/L (ref 22–32)
CREATININE: 1.14 mg/dL — AB (ref 0.44–1.00)
Calcium: 9.6 mg/dL (ref 8.9–10.3)
Chloride: 106 mmol/L (ref 101–111)
GFR calc Af Amer: >60 mL/min (ref >60)
GFR, EST NON AFRICAN AMERICAN: 55 mL/min — AB (ref >60)
Glucose, Bld: 102 mg/dL — ABNORMAL HIGH (ref 65–99)
Potassium: 3.9 mmol/L (ref 3.5–5.1)
SODIUM: 140 mmol/L (ref 135–145)

## 2017-11-18 LAB — CBC
HCT: 36.3 % (ref 36.0–46.0)
HEMOGLOBIN: 12.3 g/dL (ref 12.0–15.0)
MCH: 31.8 pg (ref 26.0–34.0)
MCHC: 33.9 g/dL (ref 30.0–36.0)
MCV: 93.8 fL (ref 78.0–100.0)
PLATELETS: 256 10*3/uL (ref 150–400)
RBC: 3.87 MIL/uL (ref 3.87–5.11)
RDW: 15.8 % — ABNORMAL HIGH (ref 11.5–15.5)
WBC: 7.4 10*3/uL (ref 4.0–10.5)

## 2017-11-18 LAB — URINALYSIS, ROUTINE W REFLEX MICROSCOPIC
BACTERIA UA: NONE SEEN
Bilirubin Urine: NEGATIVE
Glucose, UA: NEGATIVE mg/dL
HGB URINE DIPSTICK: NEGATIVE
KETONES UR: NEGATIVE mg/dL
Leukocytes, UA: NEGATIVE
Nitrite: NEGATIVE
PROTEIN: 30 mg/dL — AB
Specific Gravity, Urine: 1.018 (ref 1.005–1.030)
pH: 5 (ref 5.0–8.0)

## 2017-11-18 LAB — BRAIN NATRIURETIC PEPTIDE: B NATRIURETIC PEPTIDE 5: 20.8 pg/mL (ref 0.0–100.0)

## 2017-11-18 LAB — TSH: TSH: 0.837 u[IU]/mL (ref 0.350–4.500)

## 2017-11-18 LAB — I-STAT BETA HCG BLOOD, ED (MC, WL, AP ONLY): I-stat hCG, quantitative: <5 m[IU]/mL (ref <5)

## 2017-11-18 LAB — I-STAT TROPONIN, ED: Troponin i, poc: 0 ng/mL (ref 0.00–0.08)

## 2017-11-18 LAB — T4, FREE: Free T4: 0.96 ng/dL (ref 0.82–1.77)

## 2017-11-18 MED ORDER — METOPROLOL TARTRATE 5 MG/5ML IV SOLN
5.0000 mg | Freq: Once | INTRAVENOUS | Status: AC
  Administered 2017-11-18: 5 mg via INTRAVENOUS
  Filled 2017-11-18: qty 5

## 2017-11-18 MED ORDER — HYDROMORPHONE HCL 1 MG/ML IJ SOLN
1.0000 mg | Freq: Once | INTRAMUSCULAR | Status: AC
  Administered 2017-11-18: 1 mg via INTRAVENOUS
  Filled 2017-11-18: qty 1

## 2017-11-18 MED ORDER — SODIUM CHLORIDE 0.9 % IV BOLUS
500.0000 mL | Freq: Once | INTRAVENOUS | Status: AC
Start: 2017-11-18 — End: 2017-11-18
  Administered 2017-11-18: 500 mL via INTRAVENOUS

## 2017-11-18 MED ORDER — METHYLPREDNISOLONE SODIUM SUCC 125 MG IJ SOLR
125.0000 mg | Freq: Once | INTRAMUSCULAR | Status: AC
  Administered 2017-11-18: 125 mg via INTRAVENOUS
  Filled 2017-11-18: qty 2

## 2017-11-18 MED ORDER — CARVEDILOL 12.5 MG PO TABS: 12.5000 mg | ORAL_TABLET | Freq: Two times a day (BID) | ORAL | Status: DC

## 2017-11-18 MED ORDER — KETOROLAC TROMETHAMINE 30 MG/ML IJ SOLN
15.0000 mg | Freq: Once | INTRAMUSCULAR | Status: AC
  Administered 2017-11-18: 15 mg via INTRAVENOUS
  Filled 2017-11-18: qty 1

## 2017-11-18 MED ORDER — CARVEDILOL 12.5 MG PO TABS
12.5000 mg | ORAL_TABLET | Freq: Once | ORAL | Status: AC
  Administered 2017-11-18: 12.5 mg via ORAL
  Filled 2017-11-18: qty 1

## 2017-11-18 MED ORDER — PREDNISONE 20 MG PO TABS: 40.0000 mg | ORAL_TABLET | Freq: Every day | ORAL | 0 refills | Status: AC

## 2017-11-18 MED ORDER — HYDROMORPHONE HCL 1 MG/ML IJ SOLN
0.5000 mg | Freq: Once | INTRAMUSCULAR | Status: AC
  Administered 2017-11-18: 0.5 mg via INTRAVENOUS
  Filled 2017-11-18: qty 1

## 2017-11-18 MED ORDER — PREDNISONE 10 MG PO TABS: 10.0000 mg | ORAL_TABLET | Freq: Every day | ORAL | 0 refills | Status: AC

## 2017-11-18 NOTE — ED Notes (Signed)
Provided patient a coke. Provider had gave permission earlier for po fluids.

## 2017-11-18 NOTE — Discharge Instructions (Addendum)
Your work up today has been reassuring, however your blood pressure and heart rate were high. Take coreg 25mg  (double dose) twice a day. Eat a low salt/low sodium diet as outlined below. Follow up with cardiology for your high heart rate and blood pressure, and for recheck of your chest pain.   Regarding your gout: Take 40mg  of prednisone for 4 more days starting tomorrow morning, then go back to normal dose. Take your usual home pain medications to help with pain. Continue your colchicine and allopurinol as directed by your regular doctor. Ice and elevate the areas of pain. Eat a low-purine diet to help with gout flares. Follow up with family doctor for ongoing management of your gout.   Return to the ER for emergent changes or worsening symptoms.

## 2017-11-18 NOTE — ED Notes (Signed)
Provider provided patient Kathy Bruce and water.

## 2017-11-18 NOTE — ED Triage Notes (Signed)
Pt c/o generalized intermittent chest pains that started this am. Also c/o gout in hands, feet, left arm that has been recurrent for years. Pt reports that before she left home her BP and Hr were elevated.

## 2017-11-18 NOTE — ED Notes (Signed)
EDPA Provider at bedside. 

## 2017-11-18 NOTE — ED Notes (Signed)
ED Provider at bedside. 

## 2017-11-18 NOTE — ED Notes (Signed)
EDPA TATYANA Provider at bedside.

## 2017-11-18 NOTE — ED Provider Notes (Signed)
Care assumed from [facility], [place], at shift change, please see their notes for full documentation of patient's complaint/HPI. Briefly, pt here with multiple complaints, including atypical chest pain, and gout pain in multiple areas but primarily her L hand/middle finger. Results so far show trop neg, betaHCG neg, BMP with stable kidney function, CBC WNL, BNP WNL, CXR without acute findings, EKG reassuring. Her pain has finally been controlled with several doses of dilaudid however she remains tachycardic and hypertensive (HR 110s, BP 160-180s/100s) therefore decision was made to proceed with U/A and TSH/T4 testing. Awaiting these results; she's also receiving a dose of lopressor IV 5mg  and Coreg 12.5mg  (which would be an additional dose of her home med). Plan is to reassess after labs return, if HR/BP improving then plan is to d/c home with instructions to increase coreg to 25mg  BID and f/up with cardiology, and as for her gout she will increase prednisone to 40mg  QD x4 days then back down to normal dosing; she's already on colchicine and allopurinol, and already has pain meds at home. Of note, she was tachycardic on a prior ED visit for CP, had CTA which was negative; no concern for PE today thus this was not repeated.   Physical Exam  BP (!) 138/93   Pulse 91   Temp 98.3 F (36.8 C) (Oral)   Resp 16   Ht 5\' 2"  (1.575 m)   Wt 79.8 kg (176 lb)   SpO2 99%   BMI 32.19 kg/m   Physical Exam Gen: afebrile, VSS, BP 130s/90s, NAD HEENT: EOMI, MMM Resp: no resp distress CV: HR 90s during exam Abd: appearance normal, nondistended MsK: moving all extremities with ease   ED Course/Procedures     Results for orders placed or performed during the hospital encounter of 81/19/14  Basic metabolic panel  Result Value Ref Range   Sodium 140 135 - 145 mmol/L   Potassium 3.9 3.5 - 5.1 mmol/L   Chloride 106 101 - 111 mmol/L   CO2 24 22 - 32 mmol/L   Glucose, Bld 102 (H) 65 - 99 mg/dL   BUN 24  (H) 6 - 20 mg/dL   Creatinine, Ser 1.14 (H) 0.44 - 1.00 mg/dL   Calcium 9.6 8.9 - 10.3 mg/dL   GFR calc non Af Amer 55 (L) >60 mL/min   GFR calc Af Amer >60 >60 mL/min   Anion gap 10 5 - 15  CBC  Result Value Ref Range   WBC 7.4 4.0 - 10.5 K/uL   RBC 3.87 3.87 - 5.11 MIL/uL   Hemoglobin 12.3 12.0 - 15.0 g/dL   HCT 36.3 36.0 - 46.0 %   MCV 93.8 78.0 - 100.0 fL   MCH 31.8 26.0 - 34.0 pg   MCHC 33.9 30.0 - 36.0 g/dL   RDW 15.8 (H) 11.5 - 15.5 %   Platelets 256 150 - 400 K/uL  Brain natriuretic peptide  Result Value Ref Range   B Natriuretic Peptide 20.8 0.0 - 100.0 pg/mL  TSH  Result Value Ref Range   TSH 0.837 0.350 - 4.500 uIU/mL  Urinalysis, Routine w reflex microscopic  Result Value Ref Range   Color, Urine YELLOW YELLOW   APPearance CLEAR CLEAR   Specific Gravity, Urine 1.018 1.005 - 1.030   pH 5.0 5.0 - 8.0   Glucose, UA NEGATIVE NEGATIVE mg/dL   Hgb urine dipstick NEGATIVE NEGATIVE   Bilirubin Urine NEGATIVE NEGATIVE   Ketones, ur NEGATIVE NEGATIVE mg/dL   Protein, ur 30 (A)  NEGATIVE mg/dL   Nitrite NEGATIVE NEGATIVE   Leukocytes, UA NEGATIVE NEGATIVE   RBC / HPF 0-5 0 - 5 RBC/hpf   WBC, UA 0-5 0 - 5 WBC/hpf   Bacteria, UA NONE SEEN NONE SEEN   Squamous Epithelial / LPF 0-5 0 - 5   Mucus PRESENT   I-stat troponin, ED  Result Value Ref Range   Troponin i, poc 0.00 0.00 - 0.08 ng/mL   Comment 3          I-Stat beta hCG blood, ED  Result Value Ref Range   I-stat hCG, quantitative <5.0 <5 mIU/mL   Comment 3           Dg Chest 2 View  Result Date: 11/18/2017 CLINICAL DATA:  Mid to left chest pain starting this morning. EXAM: CHEST - 2 VIEW COMPARISON:  CT chest and chest radiograph 08/31/2017. FINDINGS: Trachea is midline. Heart is enlarged. A single lead defibrillator is seen in the subcutaneous soft tissues of the midline anterior chest wall. Lungs are clear. No pleural fluid. IMPRESSION: No acute findings. Electronically Signed   By: Lorin Picket M.D.   On:  11/18/2017 11:22     Meds ordered this encounter  Medications  . HYDROmorphone (DILAUDID) injection 0.5 mg  . methylPREDNISolone sodium succinate (SOLU-MEDROL) 125 mg/2 mL injection 125 mg  . HYDROmorphone (DILAUDID) injection 1 mg  . HYDROmorphone (DILAUDID) injection 1 mg  . sodium chloride 0.9 % bolus 500 mL  . ketorolac (TORADOL) 30 MG/ML injection 15 mg  . DISCONTD: carvedilol (COREG) tablet 12.5 mg  . metoprolol tartrate (LOPRESSOR) injection 5 mg  . DISCONTD: carvedilol (COREG) tablet 12.5 mg  . carvedilol (COREG) tablet 12.5 mg  . predniSONE (DELTASONE) 20 MG tablet    Sig: Take 2 tablets (40 mg total) by mouth daily with breakfast for 4 days. STARTING 11/19/17    Dispense:  8 tablet    Refill:  0    Order Specific Question:   Supervising Provider    Answer:   Noemi Chapel [[id number]]  . predniSONE (DELTASONE) 10 MG tablet    Sig: Take 1 tablet (10 mg total) by mouth daily with breakfast. STARTING AFTER FINISHING THE 40MG  DOSING    Dispense:  20 tablet    Refill:  0    Order Specific Question:   Supervising Provider    Answer:   Noemi Chapel [[id number]]     MDM:   ICD-10-CM   1. Acute gout, unspecified cause, unspecified site M10.9   2. Tachycardia R00.0   3. Secondary hypertension I15.9   4. Atypical chest pain R07.89     6:15 PM TSH WNL. U/A without evidence of UTI. Free T4 pending, but doubt this needs to hold up pt's dispo. HR improved down to 90s, BP down to 130/90s. Pt continues to feel improved. Will d/c home with previously outlined plan per prior provider, which included: increase coreg to 25mg  BID and f/up with cardiologist for management of this and recheck of her chest pain; increase prednisone to 40mg  daily x4 days then back to original dose and use of home medications for gout/pain control, and f/up with PCP for this. Discussed diet modifications for gout and for HTN. Pt states she's out of the prednisone and needs refills, will give her the rx for 40mg /day x4  days and a short refill of her usual home dose of 10mg /day. Advised that future refills would need to be given by her PCP. Pt has coreg at home, doesn't  need refill of this. I explained the diagnosis and have given explicit precautions to return to the ER including for any other new or worsening symptoms. The patient understands and accepts the medical plan as it's been dictated and I have answered their questions. Discharge instructions concerning home care and prescriptions have been given. The patient is STABLE and is discharged to home in good condition.     [address], [place], [place] 11/18/17 1816    Charlesetta Shanks, MD 11/20/17 1723

## 2017-11-18 NOTE — ED Notes (Signed)
EKG was done earlier in triage.

## 2017-11-18 NOTE — ED Notes (Signed)
PT ENCOURAGED TO OBTAIN URINE SAMPLE

## 2017-11-18 NOTE — ED Provider Notes (Signed)
[facility] DEPT Provider Note   CSN: [id number] Arrival date & time: 11/18/17  1043     History   Chief Complaint Chief Complaint  Patient presents with  . Chest Pain  . Gout    HPI Kathy Bruce is a 52 y.o. female.  HPI Kathy Bruce is a 52 y.o. female with hx of CKD, CAD, CHF, gout. Presents to ED with pain to the left hand from gout and chest pain.  Patient states that her pain in her hand, top of her feet, right knee, started yesterday.  She states this feels like prior gout exacerbations.  She states that normally she takes allopurinol and colchicine, to both this morning.  She states that she takes prednisone 10 mg daily for her gout.  She took 10 mg today.  She denies any fever.  No injuries.  States pain with movement of her hands and walking.  Also complaining of chest pain that started this morning.  Pain is sharp, shooting, comes and goes.  Noticed that her blood pressure and heart rate were elevated at home today.  Past Medical History:  Diagnosis Date  . Chronic kidney disease (CKD)   . Coronary artery disease involving native heart with angina pectoris [facility]) 2014   Patient reports MI in 2013 (in [place]) -- 2nd [facility] 2014 in [place], [place] --? stent placement when patient was living in [place]. Need outside records.   . Dilated cardiomyopathy ([place]) 2013   Patient reports this was secondary to MI and she to have AICD placed.   Marland Kitchen GERD (gastroesophageal reflux disease)   . Gout   . Hypertensive heart disease with chronic combined systolic and diastolic congestive heart failure ([place]) 2013  . ICD (implantable cardioverter-defibrillator) in place, [place], 2013 2013   [facility] in [place], (DR. [place])  . Unspecified mood (affective) disorder [facility])     Patient Active Problem List   Diagnosis Date Noted  . Colon cancer screening   . Gout 09/01/2017  . Renal insufficiency 09/01/2017  . HTN  (hypertension) 09/01/2017  . Acute lower UTI 09/01/2017  . SIRS (systemic inflammatory response syndrome) ([place]) 08/31/2017  . Hyperlipidemia due to dietary fat intake 08/20/2017  . Hypertensive urgency 07/02/2017  . Chest pain with moderate risk for cardiac etiology 07/02/2017  . ICD (implantable cardioverter-defibrillator) in place, [facility], 2013 07/02/2017  . UTI (urinary tract infection) 07/02/2017  . Chest pain 07/02/2017  . Coronary artery disease involving native heart with angina pectoris ([place]) 06/09/2012  . Hypertensive heart disease with chronic combined systolic and diastolic congestive heart failure ([place]) 06/10/2011  . Dilated cardiomyopathy ([place]) 06/10/2011    Past Surgical History:  Procedure Laterality Date  . CARDIAC DEFIBRILLATOR PLACEMENT  [address] [employer] ICD, subcu device, single lead. Implanted in [place]  . COLONOSCOPY WITH PROPOFOL N/A 10/05/2017   Procedure: COLONOSCOPY WITH PROPOFOL;  Surgeon: Yetta Flock, MD;  Location: [facility];  Service: Gastroenterology;  Laterality: N/A;  . TRANSTHORACIC ECHOCARDIOGRAM  06/2017   Mild LV dilation.  Normal thickness.  Mildly reduced EF of 30-35%.  Moderate diffuse HK.  GRII DD.  Moderate-severe MR.  Mild LA dilation      OB History   None      Home Medications    Prior to Admission medications   Medication Sig Start Date End Date Taking? Authorizing Provider  allopurinol (ZYLOPRIM) 300 MG tablet Take 300 mg by mouth daily. 10/02/17   [provider]  aspirin EC 81 MG tablet Take 1 tablet (81 mg total) by mouth daily. 07/05/17   Oswald Hillock, MD  aspirin-acetaminophen-caffeine (EXCEDRIN MIGRAINE) [phone] MG tablet Take 2 tablets by mouth daily as needed for headache or migraine.    [provider]  atorvastatin (LIPITOR) 40 MG tablet Take 1 tablet (40 mg total) by mouth daily. 08/20/17 11/18/17  Leonie Man, MD  baclofen (LIORESAL) 10 MG tablet Take 10 mg by mouth 3 (three) times  daily.    [provider]  carvedilol (COREG) 12.5 MG tablet Take 1 tablet (12.5 mg total) by mouth 2 (two) times daily with a meal. 07/05/17   Oswald Hillock, MD  cloNIDine (CATAPRES) 0.1 MG tablet Take 1 tablet (0.1 mg total) by mouth daily. 07/05/17   Oswald Hillock, MD  colchicine 0.6 MG tablet Take 1 tablet (0.6 mg total) by mouth 2 (two) times daily. Patient taking differently: Take 0.6 mg by mouth daily.  07/05/17   Oswald Hillock, MD  furosemide (LASIX) 40 MG tablet Take 1 tablet (40 mg total) by mouth daily. 08/20/17 11/18/17  Leonie Man, MD  isosorbide mononitrate (IMDUR) 30 MG 24 hr tablet Take 1 tablet (30 mg total) by mouth daily. 08/20/17 11/18/17  Leonie Man, MD  Multiple Vitamins-Minerals (MULTIVITAMIN GUMMIES ADULT) CHEW Chew 2 capsules by mouth daily.    [provider]  naproxen (NAPROSYN) 500 MG tablet Take 1 tablet (500 mg total) by mouth 2 (two) times daily with a meal. 09/06/17   Donne Hazel, MD  nitroGLYCERIN (NITROSTAT) 0.4 MG SL tablet Place 0.4 mg under the tongue every 5 (five) minutes as needed for chest pain.    [provider]  Omega-3 Fatty Acids (FISH OIL PO) Take 1 capsule by mouth once a week.    [provider]  omeprazole (PRILOSEC) 40 MG capsule Take 1 capsule (40 mg total) by mouth daily. 07/05/17   Oswald Hillock, MD  OVER THE COUNTER MEDICATION Take 1 capsule by mouth once a week. Triflex otc supplement    [provider]  oxyCODONE-acetaminophen (PERCOCET) 7.5-325 MG tablet Take 1 tablet by mouth 3 (three) times daily as needed for severe pain.    [provider]  predniSONE (DELTASONE) 10 MG tablet Take 1 tablet (10 mg total) by mouth daily with breakfast. 07/05/17   Oswald Hillock, MD  sacubitril-valsartan (ENTRESTO) 24-26 MG Take 1 tablet by mouth 2 (two) times daily. Patient not taking: Reported on 10/21/2017 09/25/17   Barrett, Evelene Croon, PA-C  sacubitril-valsartan (ENTRESTO) 49-51 MG Take 1 tablet by  mouth 2 (two) times daily.    [provider]  SUPREP BOWEL PREP KIT 17.5-3.13-1.6 GM/177ML SOLN Suprep-Use as directed Patient not taking: Reported on 10/21/2017 09/14/17   Armbruster, Carlota Raspberry, MD  tetrahydrozoline (VISINE) 0.05 % ophthalmic solution Place 2 drops into both eyes daily as needed (DRY EYES). 07/05/17   Oswald Hillock, MD  zolpidem (AMBIEN) 10 MG tablet Take 10 mg by mouth at bedtime as needed for sleep.  08/28/17   [provider]    Family History Family History  Problem Relation Age of Onset  . Hyperthyroidism Mother   . Diabetes Mother   . Tuberculosis Father   . Heart attack Brother   . Hypertension Sister   . Arthritis Sister   . Hyperthyroidism Brother   . Hypertension Brother   . Hypertension Son   . Gout Son   .  Diabetes Daughter   . Stomach cancer Neg Hx   . Colon cancer Neg Hx     Social History Social History   Tobacco Use  . Smoking status: Never Smoker  . Smokeless tobacco: Never Used  Substance Use Topics  . Alcohol use: Yes    Frequency: Never    Comment: Occasionally  . Drug use: No     Allergies   Patient has no known allergies.   Review of Systems Review of Systems  Constitutional: Negative for chills and fever.  Respiratory: Positive for chest tightness. Negative for cough and shortness of breath.   Cardiovascular: Positive for chest pain. Negative for palpitations and leg swelling.  Gastrointestinal: Negative for abdominal pain, diarrhea, nausea and vomiting.  Genitourinary: Negative for dysuria, flank pain and pelvic pain.  Musculoskeletal: Positive for arthralgias, joint swelling and myalgias. Negative for back pain, neck pain and neck stiffness.  Skin: Negative for rash.  Neurological: Negative for dizziness, weakness and headaches.  All other systems reviewed and are negative.    Physical Exam Updated Vital Signs BP (!) 179/115 (BP Location: Right Arm)   Pulse (!) 118   Temp 98.3 F (36.8 C) (Oral)    Resp 16   SpO2 100%   Physical Exam  Constitutional: She is oriented to person, place, and time. She appears well-developed and well-nourished. No distress.  HENT:  Head: Normocephalic.  Eyes: Pupils are equal, round, and reactive to light. Conjunctivae and EOM are normal.  Neck: Neck supple.  Cardiovascular: Normal rate, regular rhythm and normal heart sounds.  Pulmonary/Chest: Effort normal and breath sounds normal. No respiratory distress. She has no wheezes. She has no rales.  Abdominal: Soft. Bowel sounds are normal. She exhibits no distension. There is no tenderness. There is no rebound.  Musculoskeletal: She exhibits no edema.  Tenderness to palpation of the left middle finger, diffusely.  Pain with range of motion of the left middle finger at mainly PIP joint, but also MCP and DIP joints.  Tenderness over the dorsal feet bilaterally, as well as bilateral ankles and the right knee.  Pain with range of motion.  Full range of motion.  Neurological: She is alert and oriented to person, place, and time.  Skin: Skin is warm and dry.  Psychiatric: She has a normal mood and affect. Her behavior is normal.  Nursing note and vitals reviewed.    ED Treatments / Results  Labs (all labs ordered are listed, but only abnormal results are displayed) Labs Reviewed  BASIC METABOLIC PANEL - Abnormal; Notable for the following components:      Result Value   Glucose, Bld 102 (*)    BUN 24 (*)    Creatinine, Ser 1.14 (*)    GFR calc non Af Amer 55 (*)    All other components within normal limits  CBC - Abnormal; Notable for the following components:   RDW 15.8 (*)    All other components within normal limits  URINE CULTURE  BRAIN NATRIURETIC PEPTIDE  TSH  T4, FREE  URINALYSIS, ROUTINE W REFLEX MICROSCOPIC  I-STAT TROPONIN, ED  I-STAT BETA HCG BLOOD, ED ([facility], [facility], [facility] ONLY)  I-STAT TROPONIN, ED    EKG EKG Interpretation  Date/Time:  Wednesday November 18 2017 10:48:41 EDT Ventricular  Rate:  124 PR Interval:    QRS Duration: 95 QT Interval:  296 QTC Calculation: 426 R Axis:   20 Text Interpretation:  Sinus tachycardia Borderline repolarization abnormality no change from previous, EKG. Confirmed by  Charlesetta Shanks [phone]) on 11/18/2017 3:10:36 PM   Radiology Dg Chest 2 View  Result Date: 11/18/2017 CLINICAL DATA:  Mid to left chest pain starting this morning. EXAM: CHEST - 2 VIEW COMPARISON:  CT chest and chest radiograph 08/31/2017. FINDINGS: Trachea is midline. Heart is enlarged. A single lead defibrillator is seen in the subcutaneous soft tissues of the midline anterior chest wall. Lungs are clear. No pleural fluid. IMPRESSION: No acute findings. Electronically Signed   By: Lorin Picket M.D.   On: 11/18/2017 11:22    Procedures Procedures (including critical care time)  Medications Ordered in ED Medications - No data to display   Initial Impression / Assessment and Plan / ED Course  I have reviewed the triage vital signs and the nursing notes.  Pertinent labs & imaging results that were available during my care of the patient were reviewed by me and considered in my medical decision making (see chart for details).     Patient emergency department with gout flare.  States feels like gout in the past.  No injuries.  Will give dose of steroids and pain management.  She took a Percocet at home which did not help.  On chronic prednisone 10 mg daily.  Patient is also in the emergency department complaining of chest pain that started today.  Pain is atypical, sharp, shooting.  Concerned that her heart rate is elevated blood pressure is elevated. she is afebrile.  No infectious symptoms.  Will monitor, reassess after pain management to see if her vital signs improved.  Will get labs.  2:37 PM  Patient received not to do pain medicine, and states she is not feeling any better.  I will give her some more pain medications, Toradol, and a small bolus of IV fluids.  She  still tachycardic, and hypertensive. States she took all her medications today. States she has episodes of htn and tachycardia once in a while at home. States "they cant figure out why its happening."  3:30 PM Discussed with Dr. Vallery Ridge.  Patient had elevated heart rate in March when was admitted for possible sepsis from UTI.  Patient is afebrile here, checked her temperature again myself.  She states she has no urinary symptoms.  She does not feel like she is running a fever.  We will try Lopressor and give her her regular dose of Coreg here.  I do not think she has a PE, she denies any shortness of breath at this time.  She was worked up for this in March, and had CT angiogram which was negative.  She received IV fluids.  Will obtain TSH and free T4, will monitor. 4:17 PM Pt received lopressor, HR down to 90s. Pending UA and TSH. If negative and HR improved, OK to go home. Increase coreg to 65m BID. Pain medications and increase prednisone burst for gout, 585mx5 days, then continue regular dose. Follow up closely with pcp.   Signed out to [address] at shift change.     Final Clinical Impressions(s) / ED Diagnoses   Final diagnoses:  None    ED Discharge Orders    None      KiJeannett SeniorPA-C 11/18/17 1621  PfCharlesetta ShanksMD 11/20/17 1723

## 2017-11-19 ENCOUNTER — Ambulatory Visit: Payer: Medicaid Other | Admitting: Cardiology

## 2017-11-19 ENCOUNTER — Encounter: Payer: Self-pay | Admitting: Cardiology

## 2017-11-19 VITALS — BP 154/97 | HR 98 | Ht 62.0 in | Wt 180.8 lb

## 2017-11-19 DIAGNOSIS — I5042 Chronic combined systolic (congestive) and diastolic (congestive) heart failure: Secondary | ICD-10-CM | POA: Diagnosis not present

## 2017-11-19 DIAGNOSIS — I2510 Atherosclerotic heart disease of native coronary artery without angina pectoris: Secondary | ICD-10-CM

## 2017-11-19 DIAGNOSIS — E7849 Other hyperlipidemia: Secondary | ICD-10-CM | POA: Diagnosis not present

## 2017-11-19 DIAGNOSIS — I110 Hypertensive heart disease with heart failure: Secondary | ICD-10-CM

## 2017-11-19 DIAGNOSIS — I420 Dilated cardiomyopathy: Secondary | ICD-10-CM

## 2017-11-19 DIAGNOSIS — Z95810 Presence of automatic (implantable) cardiac defibrillator: Secondary | ICD-10-CM | POA: Diagnosis not present

## 2017-11-19 MED ORDER — CARVEDILOL 25 MG PO TABS: 25.0000 mg | ORAL_TABLET | Freq: Two times a day (BID) | ORAL | 3 refills | Status: AC

## 2017-11-19 MED ORDER — SACUBITRIL-VALSARTAN 97-103 MG PO TABS: 1.0000 | ORAL_TABLET | Freq: Two times a day (BID) | ORAL | 3 refills | Status: AC

## 2017-11-19 NOTE — Patient Instructions (Signed)
MEDICATION INSTRUCTIONS   ----INCREASE ENTRESTO TO 97/103 MG TAKE ONE TABLET TWICE        A DAY  ----CONTINUE WITH INCREASE DOSE OF ( COREG) CARVEDILOL 25 MG TWICE A DAY     IF YOUR GOUT FLAIRS UP ,YOU MAY STOP ASPIRIN FOR ONE WEEK  THEN RESTART.   REDUCE CLONIDINE TO TWICE A DAY  , IF BLOOD PRESSURE  IS ABOVE 170/?  MAY TAKE AN EXTRA CLONIDINE IF STILL UP AFTER AN HOUR MAY TAKE ANOTHER   LABS-- CMP.LIPID IN 1 MONTH -- Your physician recommends that you schedule a follow-up appointment in 1 MONTH WITH CVRR.   Your physician wants you to follow-up in [place]. You will receive a reminder letter in the mail two months in advance. If you don't receive a letter, please call our office to schedule the follow-up appointment.

## 2017-11-19 NOTE — Progress Notes (Signed)
PCP: Hayden Rasmussen, MD  Clinic Note: Chief Complaint  Patient presents with  . Follow-up    Pt states no Sx  . Cardiomyopathy    HPI: Kathy Bruce is a 52 y.o. female with a PMH below who presents today for follow-up presumed NICM. [place] - EF initially ~25% -> improved to 30-35% in ~2014 --> is s/p L Lateral SubQ ICD. She was initially seen by Dr. Debara Pickett in the hospital in Jan 2019. . -- Appt not made on d/c --  I saw her on 3/14 for f/u --> at that time she had her breathing was much improved.  PND though but no orthopnea. --> set up ICD f/u.  Increased Lasix to 63m with [facility].  CForrestine Lecronewas last seen on September 25, 2017 by RRosaria Ferries PA-C.  Started Entresto.  Was established with Dr. CSallyanne Kusterto follow ICD.  Noted some episodes of chest discomfort. -- f/u with CVRR for titration of meds to moderate dose (49/51) Entresto.  Recent Hospitalizations:   ER for Gout 6/12 - very hypertensive - told to increase Carvedilol to 25 mg bid.  Studies Personally Reviewed - (if available, images/films reviewed: From Epic Chart or Care Everywhere)  none  Interval History: CVerlareturns today after her ER visit for gout noting that her foot still hurts, but yesterday she also had a headache & felt a bit nauseated.  With BP up, she does note more DOE, but denies any CP.  Really only notes CP when stressed or upset -- she has been a bit upset lately with 2 family members dying in the last 233weeks (164month old & 373y/o nephews). No PND, orthopnea or edema. No palpitations, lightheadedness, dizziness, weakness or syncope/near syncope. No TIA/amaurosis fugax symptoms. No melena, hematochezia, hematuria, or epstaxis. No claudication.  ROS: A comprehensive was performed. Review of Systems  Constitutional: Negative for malaise/fatigue.  HENT: Negative for congestion and nosebleeds.   Respiratory: Positive for  cough and shortness of breath (per HPI).   Musculoskeletal: Negative for falls and joint pain.  Neurological: Positive for dizziness and headaches.       Associated with high BP  Psychiatric/Behavioral: Negative for memory loss. The patient does not have insomnia.   All other systems reviewed and are negative.  I have reviewed and (if needed) personally updated the patient's problem list, medications, allergies, past medical and surgical history, social and family history.   Past Medical History:  Diagnosis Date  . Chronic kidney disease (CKD)   . Coronary artery disease involving native heart with angina pectoris ([facility] 2014   [place].   . Dilated cardiomyopathy ([place]   Patient reports this was secondary to MI and she to have AICD placed.   .Marland KitchenGERD (gastroesophageal reflux disease)   . Gout   . Hypertensive heart disease with chronic combined systolic and diastolic congestive heart failure ([place]  . ICD (implantable cardioverter-defibrillator[place] place[place]   [facility]  . Unspecified mood (affective) disorder ([facility]    Mother, Brother -- MI (brother died post MI).  Per her report EF was originally 25% --> Never had Cardiac Cath -- was told ST did not show signs of CAD.  Past Surgical History:  Procedure Laterality Date  . CARDIAC DEFIBRILLATOR PLACEMENT  [address] [employer] ICD, subcu device, single lead. Implanted in [place]  . COLONOSCOPY WITH PROPOFOL N/A 10/05/2017   Procedure: COLONOSCOPY WITH PROPOFOL;  Surgeon: Yetta Flock, MD;  Location: [facility];  Service: Gastroenterology;  Laterality: N/A;  . TRANSTHORACIC ECHOCARDIOGRAM  06/2017   Mild LV dilation.  Normal thickness.  Mildly reduced EF of 30-35%.  Moderate diffuse HK.  GRII DD.   Moderate-severe MR.  Mild LA dilation     Current Meds  Medication Sig  . allopurinol (ZYLOPRIM) 300 MG tablet Take 300 mg by mouth daily.  Marland Kitchen aspirin EC 81 MG tablet Take 1 tablet (81 mg total) by mouth daily.  Marland Kitchen aspirin-acetaminophen-caffeine (EXCEDRIN MIGRAINE) 250-250-65 MG tablet Take 2 tablets by mouth daily as needed for headache or migraine.  Marland Kitchen atorvastatin (LIPITOR) 40 MG tablet Take 1 tablet (40 mg total) by mouth daily.  . baclofen (LIORESAL) 10 MG tablet Take 10 mg by mouth 3 (three) times daily.  . cloNIDine (CATAPRES) 0.1 MG tablet Take 1 tablet (0.1 mg total) by mouth daily.  . colchicine 0.6 MG tablet Take 1 tablet (0.6 mg total) by mouth 2 (two) times daily. (Patient taking differently: Take 0.6 mg by mouth daily. )  . furosemide (LASIX) 40 MG tablet Take 1 tablet (40 mg total) by mouth daily.  . isosorbide mononitrate (IMDUR) 30 MG 24 hr tablet Take 1 tablet (30 mg total) by mouth daily.  . Multiple Vitamins-Minerals (MULTIVITAMIN GUMMIES ADULT) CHEW Chew 2 capsules by mouth daily.  . naproxen (NAPROSYN) 500 MG tablet Take 1 tablet (500 mg total) by mouth 2 (two) times daily with a meal.  . nitroGLYCERIN (NITROSTAT) 0.4 MG SL tablet Place 0.4 mg under the tongue every 5 (five) minutes as needed for chest pain.  . Omega-3 Fatty Acids (FISH OIL PO) Take 1 capsule by mouth once a week.  Marland Kitchen omeprazole (PRILOSEC) 40 MG capsule Take 1 capsule (40 mg total) by mouth daily.  Marland Kitchen OVER THE COUNTER MEDICATION Take 1 capsule by mouth once a week. Triflex otc supplement  . oxyCODONE-acetaminophen (PERCOCET) 7.5-325 MG tablet Take 1 tablet by mouth 3 (three) times daily as needed for severe pain.  . predniSONE (DELTASONE) 10 MG tablet Take 1 tablet (10 mg total) by mouth daily with breakfast.  . predniSONE (DELTASONE) 10 MG tablet Take 1 tablet (10 mg total) by mouth daily with breakfast. STARTING AFTER FINISHING THE 40MG DOSING  . predniSONE (DELTASONE) 20 MG tablet Take 2 tablets (40 mg  total) by mouth daily with breakfast for 4 days. STARTING 11/19/17  . predniSONE (DELTASONE) 5 MG tablet TAKE 2 TABLETS BY MOUTH WITH FOOD OR MILK ONCE DAILY FOR 30 DAYS  . SUPREP BOWEL PREP KIT 17.5-3.13-1.6 GM/177ML SOLN Suprep-Use as directed  . tetrahydrozoline (VISINE) 0.05 % ophthalmic solution Place 2 drops into both eyes daily as needed (DRY EYES).  Marland Kitchen zolpidem (AMBIEN) 10 MG tablet Take 10 mg by mouth at bedtime as needed for sleep.   . [DISCONTINUED] carvedilol (COREG) 12.5 MG tablet Take 1 tablet (12.5 mg total) by mouth 2 (two) times daily with a meal.  . [DISCONTINUED] sacubitril-valsartan (ENTRESTO) 24-26 MG Take 1 tablet by mouth 2 (two) times daily.  . [DISCONTINUED] sacubitril-valsartan (ENTRESTO) 49-51 MG Take 1 tablet by mouth 2 (two) times daily.    No Known Allergies  Social History   Tobacco Use  . Smoking status: Never Smoker  .  Smokeless tobacco: Never Used  Substance Use Topics  . Alcohol use: Yes    Frequency: Never    Comment: Occasionally  . Drug use: No   Social History   Social History Narrative   Recently moved to [place] from [place] in October 2018. Lives in an apartment with her husband.    family history includes Arthritis in her sister; Diabetes in her daughter and mother; Gout in her son; Heart attack in her brother; Hypertension in her brother, sister, and son; Hyperthyroidism in her brother and mother; Tuberculosis in her father.  Wt Readings from Last 3 Encounters:  11/19/17 180 lb 12.8 oz (82 kg)  11/18/17 176 lb (79.8 kg)  09/25/17 184 lb 9.6 oz (83.7 kg)    PHYSICAL EXAM BP (!) 154/97   Pulse 98   Ht _0  (1.575 m)   Wt 180 lb 12.8 oz (82 kg)   BMI 33.07 kg/m  Physical Exam  Constitutional: She is oriented to person, place, and time. She appears well-developed and well-nourished. No distress.  Well-groomed  Neck: JVD (8 cm H20) present. No hepatojugular reflux present. Carotid bruit is not present.  Cardiovascular:  Normal rate, regular rhythm and S1 normal.  No extrasystoles are present. PMI is displaced (laterally displaced). Exam reveals no gallop.  Murmur heard.  Medium-pitched blowing holosystolic murmur is present with a grade of 2/6 at the apex radiating to the axilla. Borderline tachycardic. ~ split S2 vs. S4.  Pulmonary/Chest: Effort normal and breath sounds normal. No respiratory distress. She has no wheezes.  Abdominal: Soft. Bowel sounds are normal. She exhibits no distension. There is no tenderness.  Musculoskeletal: Normal range of motion. She exhibits edema (trace).  Neurological: She is alert and oriented to person, place, and time.  Psychiatric: She has a normal mood and affect. Her behavior is normal. Judgment and thought content normal.  Vitals reviewed.    Adult ECG Report n/a  Other studies Reviewed: Additional studies/ records that were reviewed today include:  Recent Labs:   Lab Results  Component Value Date   CHOL 256 (H) 07/03/2017   HDL 59 07/03/2017   LDLCALC 160 (H) 07/03/2017   TRIG 187 (H) 07/03/2017   CHOLHDL 4.3 07/03/2017   Lab Results  Component Value Date   CREATININE 1.14 (H) 11/18/2017   BUN 24 (H) 11/18/2017   NA 140 11/18/2017   K 3.9 11/18/2017   CL 106 11/18/2017   CO2 24 11/18/2017    ASSESSMENT / PLAN: Problem List Items Addressed This Visit    ICD (implantable cardioverter-defibrillator) in place[place] (Chronic)    Established f/u with Dr. Sallyanne Kuster - [facility] office.      Hypertensive heart disease with chronic combined systolic and diastolic congestive heart failure (HCC) - Primary (Chronic)    Still hypertensive -- will titrate up both Entresto & Carvedilol. Hold off on spironolactone until we see BP & Sx on max doses.  Stable lasix with PRN dosing.      Relevant Medications   carvedilol (COREG) 25 MG tablet   sacubitril-valsartan (ENTRESTO) 97-103 MG   Other Relevant Orders   Comprehensive metabolic panel   Hyperlipidemia  due to dietary fat intake (Chronic)    Poorly controlled - due for f/u after "lifestyle modification" in June -- if not at goal, will start statin.      Relevant Medications   carvedilol (COREG) 25 MG tablet   sacubitril-valsartan (ENTRESTO) 97-103 MG   Other Relevant Orders   Lipid panel  Comprehensive metabolic panel   Dilated cardiomyopathy (HCC) (Chronic)    EF 30-35% by Echo - presumed non-ischemic by prior evaluation. Seems euvolemic on exam, but still Hypertensive.  -- Increase Carvedilol to 25 mg BID & Entresto to 97/103 mg bid Continue current Lasix dose - with sliding scale.      Relevant Medications   carvedilol (COREG) 25 MG tablet   sacubitril-valsartan (ENTRESTO) 97-103 MG   Coronary artery disease, non-occlusive (Chronic)    By report 2 MIs (? demad ischemia) -- no Cath during prior evaluations. Non-ischemic ST. Probably related to CHF.  Need to follow lipids & other RFs.      Relevant Medications   carvedilol (COREG) 25 MG tablet   sacubitril-valsartan (ENTRESTO) 97-103 MG     Current medicines are reviewed at length with the patient today.  (+/- concerns) n/a The following changes have been made:  see below  Patient Instructions  MEDICATION INSTRUCTIONS   ----INCREASE ENTRESTO TO 97/103 MG TAKE ONE TABLET TWICE        A DAY  ----CONTINUE WITH INCREASE DOSE OF ( COREG) CARVEDILOL 25 MG TWICE A DAY     IF YOUR GOUT FLAIRS UP ,YOU MAY STOP ASPIRIN FOR ONE WEEK  THEN RESTART.   REDUCE CLONIDINE TO TWICE A DAY  , IF BLOOD PRESSURE  IS ABOVE 170/?  MAY TAKE AN EXTRA CLONIDINE IF STILL UP AFTER AN HOUR MAY TAKE ANOTHER   LABS-- CMP.LIPID IN 1 MONTH -- Your physician recommends that you schedule a follow-up appointment in 1 MONTH WITH CVRR.   Your physician wants you to follow-up in [place]. You will receive a reminder letter in the mail two months in advance. If you don't receive a letter, please call our office to schedule the  follow-up appointment.     Studies Ordered:   Orders Placed This Encounter  Procedures  . Lipid panel  . Comprehensive metabolic panel      Glenetta Hew, M.D., M.S. Interventional Cardiologist   Pager # [phone] Phone # [phone] [address] [place],  [address]   Thank you for choosing [facility] at [facility]!!

## 2017-11-20 LAB — URINE CULTURE

## 2017-11-21 ENCOUNTER — Telehealth: Payer: Self-pay

## 2017-11-21 ENCOUNTER — Encounter: Payer: Self-pay | Admitting: Cardiology

## 2017-11-21 NOTE — Assessment & Plan Note (Signed)
Established f/u with Dr. Sallyanne Kuster - [facility] office.

## 2017-11-21 NOTE — Telephone Encounter (Signed)
No treatment for UC in ED 11/18/17 per Carmon Sails [facility]

## 2017-11-21 NOTE — Assessment & Plan Note (Signed)
EF 30-35% by Echo - presumed non-ischemic by prior evaluation. Seems euvolemic on exam, but still Hypertensive.  -- Increase Carvedilol to 25 mg BID & Entresto to 97/103 mg bid Continue current Lasix dose - with sliding scale.

## 2017-11-21 NOTE — Assessment & Plan Note (Signed)
By report 2 MIs (? demad ischemia) -- no Cath during prior evaluations. Non-ischemic ST. Probably related to CHF.  Need to follow lipids & other RFs.

## 2017-11-21 NOTE — Assessment & Plan Note (Signed)
Still hypertensive -- will titrate up both Entresto & Carvedilol. Hold off on spironolactone until we see BP & Sx on max doses.  Stable lasix with PRN dosing.

## 2017-11-21 NOTE — Assessment & Plan Note (Signed)
Poorly controlled - due for f/u after "lifestyle modification" in June -- if not at goal, will start statin.

## 2017-12-24 ENCOUNTER — Ambulatory Visit: Payer: Medicaid Other

## 2018-01-23 ENCOUNTER — Inpatient Hospital Stay (HOSPITAL_COMMUNITY)
Admission: EM | Admit: 2018-01-23 | Discharge: 2018-01-24 | DRG: 303 | Discharge status: Against Medical Advice | Payer: Medicaid Other | Attending: Internal Medicine | Admitting: Internal Medicine

## 2018-01-23 ENCOUNTER — Emergency Department (HOSPITAL_COMMUNITY): Payer: Medicaid Other

## 2018-01-23 ENCOUNTER — Other Ambulatory Visit: Payer: Self-pay

## 2018-01-23 ENCOUNTER — Encounter (HOSPITAL_COMMUNITY): Payer: Self-pay

## 2018-01-23 ENCOUNTER — Other Ambulatory Visit (HOSPITAL_COMMUNITY): Payer: Self-pay

## 2018-01-23 DIAGNOSIS — Z79899 Other long term (current) drug therapy: Secondary | ICD-10-CM

## 2018-01-23 DIAGNOSIS — I208 Other forms of angina pectoris: Secondary | ICD-10-CM | POA: Diagnosis present

## 2018-01-23 DIAGNOSIS — E785 Hyperlipidemia, unspecified: Secondary | ICD-10-CM | POA: Diagnosis present

## 2018-01-23 DIAGNOSIS — R079 Chest pain, unspecified: Secondary | ICD-10-CM | POA: Diagnosis present

## 2018-01-23 DIAGNOSIS — M109 Gout, unspecified: Secondary | ICD-10-CM

## 2018-01-23 DIAGNOSIS — Z791 Long term (current) use of non-steroidal anti-inflammatories (NSAID): Secondary | ICD-10-CM

## 2018-01-23 DIAGNOSIS — I10 Essential (primary) hypertension: Secondary | ICD-10-CM | POA: Diagnosis present

## 2018-01-23 DIAGNOSIS — I252 Old myocardial infarction: Secondary | ICD-10-CM

## 2018-01-23 DIAGNOSIS — Z8249 Family history of ischemic heart disease and other diseases of the circulatory system: Secondary | ICD-10-CM

## 2018-01-23 DIAGNOSIS — N189 Chronic kidney disease, unspecified: Secondary | ICD-10-CM | POA: Diagnosis present

## 2018-01-23 DIAGNOSIS — R000 Tachycardia, unspecified: Secondary | ICD-10-CM | POA: Diagnosis present

## 2018-01-23 DIAGNOSIS — I5042 Chronic combined systolic (congestive) and diastolic (congestive) heart failure: Secondary | ICD-10-CM | POA: Diagnosis present

## 2018-01-23 DIAGNOSIS — E7849 Other hyperlipidemia: Secondary | ICD-10-CM | POA: Diagnosis present

## 2018-01-23 DIAGNOSIS — I420 Dilated cardiomyopathy: Secondary | ICD-10-CM | POA: Diagnosis present

## 2018-01-23 DIAGNOSIS — I25119 Atherosclerotic heart disease of native coronary artery with unspecified angina pectoris: Principal | ICD-10-CM | POA: Diagnosis present

## 2018-01-23 DIAGNOSIS — I130 Hypertensive heart and chronic kidney disease with heart failure and stage 1 through stage 4 chronic kidney disease, or unspecified chronic kidney disease: Secondary | ICD-10-CM | POA: Diagnosis present

## 2018-01-23 DIAGNOSIS — K219 Gastro-esophageal reflux disease without esophagitis: Secondary | ICD-10-CM | POA: Diagnosis present

## 2018-01-23 DIAGNOSIS — I340 Nonrheumatic mitral (valve) insufficiency: Secondary | ICD-10-CM | POA: Diagnosis present

## 2018-01-23 DIAGNOSIS — I2089 Other forms of angina pectoris: Secondary | ICD-10-CM | POA: Diagnosis present

## 2018-01-23 DIAGNOSIS — Z7982 Long term (current) use of aspirin: Secondary | ICD-10-CM

## 2018-01-23 DIAGNOSIS — I2510 Atherosclerotic heart disease of native coronary artery without angina pectoris: Secondary | ICD-10-CM | POA: Diagnosis present

## 2018-01-23 DIAGNOSIS — R002 Palpitations: Secondary | ICD-10-CM | POA: Diagnosis present

## 2018-01-23 LAB — APTT: APTT: 30 s (ref 24–36)

## 2018-01-23 LAB — POCT I-STAT TROPONIN I: Troponin i, poc: 0 ng/mL (ref 0.00–0.08)

## 2018-01-23 LAB — BASIC METABOLIC PANEL
Anion gap: 12 (ref 5–15)
BUN: 15 mg/dL (ref 6–20)
CO2: 22 mmol/L (ref 22–32)
CREATININE: 1.33 mg/dL — AB (ref 0.44–1.00)
Calcium: 10.3 mg/dL (ref 8.9–10.3)
Chloride: 107 mmol/L (ref 98–111)
GFR calc Af Amer: 52 mL/min — ABNORMAL LOW (ref >60)
GFR, EST NON AFRICAN AMERICAN: 45 mL/min — AB (ref >60)
GLUCOSE: 102 mg/dL — AB (ref 70–99)
POTASSIUM: 4.6 mmol/L (ref 3.5–5.1)
SODIUM: 141 mmol/L (ref 135–145)

## 2018-01-23 LAB — CBC
HEMATOCRIT: 36.2 % (ref 36.0–46.0)
Hemoglobin: 12 g/dL (ref 12.0–15.0)
MCH: 31.5 pg (ref 26.0–34.0)
MCHC: 33.1 g/dL (ref 30.0–36.0)
MCV: 95 fL (ref 78.0–100.0)
Platelets: 393 10*3/uL (ref 150–400)
RBC: 3.81 MIL/uL — ABNORMAL LOW (ref 3.87–5.11)
RDW: 13.6 % (ref 11.5–15.5)
WBC: 7.3 10*3/uL (ref 4.0–10.5)

## 2018-01-23 LAB — TROPONIN I: Troponin I: <0.03 ng/mL (ref <0.03)

## 2018-01-23 LAB — BRAIN NATRIURETIC PEPTIDE: B NATRIURETIC PEPTIDE 5: 15.7 pg/mL (ref 0.0–100.0)

## 2018-01-23 LAB — D-DIMER, QUANTITATIVE: D-Dimer, Quant: 1.2 ug/mL-FEU — ABNORMAL HIGH (ref 0.00–0.50)

## 2018-01-23 LAB — I-STAT BETA HCG BLOOD, ED (NOT ORDERABLE)

## 2018-01-23 LAB — PROTIME-INR
INR: 0.95
Prothrombin Time: 12.6 seconds (ref 11.4–15.2)

## 2018-01-23 MED ORDER — ZOLPIDEM TARTRATE 5 MG PO TABS
5.0000 mg | ORAL_TABLET | Freq: Every evening | ORAL | Status: DC | PRN
  Administered 2018-01-24: 5 mg via ORAL
  Filled 2018-01-23: qty 1

## 2018-01-23 MED ORDER — HEPARIN BOLUS VIA INFUSION
2000.0000 [IU] | Freq: Once | INTRAVENOUS | Status: AC
  Administered 2018-01-23: 2000 [IU] via INTRAVENOUS
  Filled 2018-01-23: qty 2000

## 2018-01-23 MED ORDER — NITROGLYCERIN 0.4 MG SL SUBL: 0.4000 mg | SUBLINGUAL_TABLET | SUBLINGUAL | Status: DC | PRN

## 2018-01-23 MED ORDER — NAPROXEN 500 MG PO TABS
500.0000 mg | ORAL_TABLET | Freq: Two times a day (BID) | ORAL | Status: DC
  Filled 2018-01-23: qty 1

## 2018-01-23 MED ORDER — IOPAMIDOL (ISOVUE-370) INJECTION 76%
INTRAVENOUS | Status: AC
Start: 2018-01-23 — End: 2018-01-24
  Filled 2018-01-23: qty 100

## 2018-01-23 MED ORDER — MORPHINE SULFATE (PF) 4 MG/ML IV SOLN
4.0000 mg | Freq: Once | INTRAVENOUS | Status: AC
  Administered 2018-01-23: 4 mg via INTRAVENOUS
  Filled 2018-01-23: qty 1

## 2018-01-23 MED ORDER — COLCHICINE 0.6 MG PO TABS
0.6000 mg | ORAL_TABLET | Freq: Every day | ORAL | Status: DC
  Filled 2018-01-23: qty 1

## 2018-01-23 MED ORDER — OMEGA-3-ACID ETHYL ESTERS 1 G PO CAPS
1.0000 g | ORAL_CAPSULE | Freq: Every day | ORAL | Status: DC
  Administered 2018-01-23 – 2018-01-24 (×2): 1 g via ORAL
  Filled 2018-01-23 (×2): qty 1

## 2018-01-23 MED ORDER — ISOSORBIDE MONONITRATE ER 30 MG PO TB24
30.0000 mg | ORAL_TABLET | Freq: Every day | ORAL | Status: DC
  Administered 2018-01-24: 30 mg via ORAL
  Filled 2018-01-23 (×2): qty 1

## 2018-01-23 MED ORDER — HYDROMORPHONE HCL 1 MG/ML IJ SOLN
1.0000 mg | Freq: Once | INTRAMUSCULAR | Status: AC
  Administered 2018-01-23: 1 mg via INTRAVENOUS
  Filled 2018-01-23: qty 1

## 2018-01-23 MED ORDER — ATORVASTATIN CALCIUM 40 MG PO TABS
40.0000 mg | ORAL_TABLET | Freq: Every day | ORAL | Status: DC
  Administered 2018-01-23: 40 mg via ORAL
  Filled 2018-01-23: qty 1

## 2018-01-23 MED ORDER — PANTOPRAZOLE SODIUM 40 MG PO TBEC
40.0000 mg | DELAYED_RELEASE_TABLET | Freq: Every day | ORAL | Status: DC
  Administered 2018-01-24: 40 mg via ORAL
  Filled 2018-01-23 (×2): qty 1

## 2018-01-23 MED ORDER — HEPARIN (PORCINE) IN NACL 100-0.45 UNIT/ML-% IJ SOLN
1050.0000 [IU]/h | INTRAMUSCULAR | Status: DC
  Administered 2018-01-23: 1150 [IU]/h via INTRAVENOUS
  Administered 2018-01-24: 1050 [IU]/h via INTRAVENOUS
  Filled 2018-01-23 (×2): qty 250

## 2018-01-23 MED ORDER — CLONIDINE HCL 0.1 MG PO TABS
0.1000 mg | ORAL_TABLET | Freq: Every day | ORAL | Status: DC
Start: 2018-01-24 — End: 2018-01-24
  Administered 2018-01-24: 0.1 mg via ORAL
  Filled 2018-01-23 (×2): qty 1

## 2018-01-23 MED ORDER — ADULT MULTIVITAMIN W/MINERALS CH
1.0000 | ORAL_TABLET | Freq: Every day | ORAL | Status: DC
  Administered 2018-01-24: 1 via ORAL
  Filled 2018-01-23 (×2): qty 1

## 2018-01-23 MED ORDER — ALLOPURINOL 300 MG PO TABS
300.0000 mg | ORAL_TABLET | Freq: Every day | ORAL | Status: DC
  Administered 2018-01-24: 300 mg via ORAL
  Filled 2018-01-23 (×2): qty 1

## 2018-01-23 MED ORDER — ASPIRIN-ACETAMINOPHEN-CAFFEINE 250-250-65 MG PO TABS
2.0000 | ORAL_TABLET | Freq: Every day | ORAL | Status: DC | PRN
  Filled 2018-01-23: qty 2

## 2018-01-23 MED ORDER — ACETAMINOPHEN 325 MG PO TABS: 650.0000 mg | ORAL_TABLET | ORAL | Status: DC | PRN

## 2018-01-23 MED ORDER — NAPHAZOLINE-GLYCERIN 0.012-0.2 % OP SOLN
2.0000 [drp] | Freq: Four times a day (QID) | OPHTHALMIC | Status: DC | PRN
  Filled 2018-01-23: qty 15

## 2018-01-23 MED ORDER — ASPIRIN EC 81 MG PO TBEC
81.0000 mg | DELAYED_RELEASE_TABLET | Freq: Every day | ORAL | Status: DC
  Administered 2018-01-24: 81 mg via ORAL
  Filled 2018-01-23 (×2): qty 1

## 2018-01-23 MED ORDER — OXYCODONE-ACETAMINOPHEN 7.5-325 MG PO TABS
1.0000 | ORAL_TABLET | Freq: Three times a day (TID) | ORAL | Status: DC | PRN
  Administered 2018-01-23 – 2018-01-24 (×3): 1 via ORAL
  Filled 2018-01-23 (×3): qty 1

## 2018-01-23 MED ORDER — SACUBITRIL-VALSARTAN 97-103 MG PO TABS
1.0000 | ORAL_TABLET | Freq: Two times a day (BID) | ORAL | Status: DC
  Administered 2018-01-23 – 2018-01-24 (×2): 1 via ORAL
  Filled 2018-01-23 (×2): qty 1

## 2018-01-23 MED ORDER — SODIUM CHLORIDE 0.9 % IV SOLN
INTRAVENOUS | Status: DC
  Administered 2018-01-23 – 2018-01-24 (×2): via INTRAVENOUS

## 2018-01-23 MED ORDER — FUROSEMIDE 40 MG PO TABS
40.0000 mg | ORAL_TABLET | Freq: Every day | ORAL | Status: DC
  Administered 2018-01-24: 40 mg via ORAL
  Filled 2018-01-23 (×2): qty 1

## 2018-01-23 MED ORDER — ASPIRIN 81 MG PO CHEW
324.0000 mg | CHEWABLE_TABLET | Freq: Once | ORAL | Status: AC
  Administered 2018-01-23: 324 mg via ORAL
  Filled 2018-01-23: qty 4

## 2018-01-23 MED ORDER — ONDANSETRON HCL 4 MG/2ML IJ SOLN
4.0000 mg | Freq: Four times a day (QID) | INTRAMUSCULAR | Status: DC | PRN
Start: 2018-01-23 — End: 2018-01-24

## 2018-01-23 MED ORDER — IOPAMIDOL (ISOVUE-370) INJECTION 76%
100.0000 mL | Freq: Once | INTRAVENOUS | Status: AC | PRN
  Administered 2018-01-23: 100 mL via INTRAVENOUS

## 2018-01-23 MED ORDER — CARVEDILOL 25 MG PO TABS
25.0000 mg | ORAL_TABLET | Freq: Two times a day (BID) | ORAL | Status: DC
  Administered 2018-01-23 – 2018-01-24 (×2): 25 mg via ORAL
  Filled 2018-01-23: qty 1

## 2018-01-23 MED ORDER — MULTIVITAMIN GUMMIES ADULT PO CHEW: 2.0000 | CHEWABLE_TABLET | Freq: Every day | ORAL | Status: DC

## 2018-01-23 NOTE — ED Provider Notes (Signed)
7:22 PM Patient evaluated after return of CT angiography results. She states that her pain has been episodically better after receiving pain medication. She is tolerating heparin drip without complication. Though the CT angiography results were reassuring, given her ongoing pain, and substantial risk profile including prior MI, prior stent, she will be admitted for further evaluation and management.   Carmin Muskrat, MD 01/23/18 [phone]

## 2018-01-23 NOTE — ED Notes (Signed)
ED TO INPATIENT HANDOFF REPORT  Name/Age/Gender Kathy Bruce 52 y.o. female  Code Status Code Status History    Date Active Date Inactive Code Status Order ID Comments User Context   08/31/2017 2207 09/06/2017 1717 Full Code [id number]  Norval Morton, MD ED   07/02/2017 2047 07/05/2017 1403 Full Code [id number]  Jani Gravel, MD Inpatient      Home/SNF/Other Home  Chief Complaint chest pain   Level of Care/Admitting Diagnosis ED Disposition    ED Disposition Condition [facility]: [facility] [[id number]]  Level of Care: Telemetry [5]  Admit to tele based on following criteria: Monitor for Ischemic changes  Diagnosis: Angina at rest [facility]) [[id number]]  Admitting Physician: Elwyn Reach [[id number]]  Attending Physician: Elwyn Reach [[id number]]  PT Class (Do Not Modify): Observation [104]  PT Acc Code (Do Not Modify): Observation [10022]       Medical History Past Medical History:  Diagnosis Date  . Chronic kidney disease (CKD)   . Coronary artery disease involving native heart with angina pectoris [facility]) 2014   Patient reports MI in 2013 (in [place]) -- 2nd [place] 2014 in [place], [place] --? stent placement when patient was living in [place]. Need outside records.   . Dilated cardiomyopathy ([place]) 2013   Patient reports this was secondary to MI and she to have AICD placed.   Marland Kitchen GERD (gastroesophageal reflux disease)   . Gout   . Hypertensive heart disease with chronic combined systolic and diastolic congestive heart failure ([place]) 2013  . ICD (implantable cardioverter-defibrillator) in place, [place], 2013 2013   [facility] in [place], (DR. [place])  . Unspecified mood (affective) disorder (HCC)     Allergies No Known Allergies  IV Location/Drains/Wounds Patient Lines/Drains/Airways Status   Active Line/Drains/Airways    Name:   Placement date:   Placement time:   Site:   Days:   Peripheral IV  01/23/18 Right Antecubital   01/23/18    1407    Antecubital   less than 1   Peripheral IV 01/23/18 Right Forearm   01/23/18    1735    Forearm   less than 1          Labs/Imaging Results for orders placed or performed during the hospital encounter of 01/23/18 (from the past 48 hour(s))  Basic metabolic panel     Status: Abnormal   Collection Time: 01/23/18  2:14 PM  Result Value Ref Range   Sodium 141 135 - 145 mmol/L   Potassium 4.6 3.5 - 5.1 mmol/L   Chloride 107 98 - 111 mmol/L   CO2 22 22 - 32 mmol/L   Glucose, Bld 102 (H) 70 - 99 mg/dL   BUN 15 6 - 20 mg/dL   Creatinine, Ser 1.33 (H) 0.44 - 1.00 mg/dL   Calcium 10.3 8.9 - 10.3 mg/dL   GFR calc non Af Amer 45 (L) >60 mL/min   GFR calc Af Amer 52 (L) >60 mL/min    Comment: (NOTE) The eGFR has been calculated using the CKD EPI equation. This calculation has not been validated in all clinical situations. eGFR's persistently <60 mL/min signify possible Chronic Kidney Disease.    Anion gap 12 5 - 15    Comment: Performed at [facility], [place] [address] [place], [place] [address]  CBC     Status: Abnormal   Collection Time: 01/23/18  2:14 PM  Result Value Ref Range  WBC 7.3 4.0 - 10.5 K/uL   RBC 3.81 (L) 3.87 - 5.11 MIL/uL   Hemoglobin 12.0 12.0 - 15.0 g/dL   HCT 36.2 36.0 - 46.0 %   MCV 95.0 78.0 - 100.0 fL   MCH 31.5 26.0 - 34.0 pg   MCHC 33.1 30.0 - 36.0 g/dL   RDW 13.6 11.5 - 15.5 %   Platelets 393 150 - 400 K/uL    Comment: Performed at [facility], [place] [address], [place], [place] [address]  Troponin I  (0, 3)     Status: None   Collection Time: 01/23/18  2:16 PM  Result Value Ref Range   Troponin I <0.03 <0.03 ng/mL    Comment: Performed at [facility], [place] [address] [place], [place] [address]  Brain natriuretic peptide     Status: None   Collection Time: 01/23/18  2:16 PM  Result Value Ref Range   B Natriuretic Peptide 15.7 0.0 - 100.0 pg/mL     Comment: Performed at [facility], [place] [address], [place], [place] [address]  D-dimer, quantitative (not at [facility])     Status: Abnormal   Collection Time: 01/23/18  2:16 PM  Result Value Ref Range   D-Dimer, Quant 1.20 (H) 0.00 - 0.50 ug/mL-FEU    Comment: (NOTE) At the manufacturer cut-off of 0.50 ug/mL FEU, this assay has been documented to exclude PE with a sensitivity and negative predictive value of 97 to 99%.  At this time, this assay has not been approved by the FDA to exclude DVT/VTE. Results should be correlated with clinical presentation. Performed at [facility], [place] [address] [place], [place] [address]   Protime-INR     Status: None   Collection Time: 01/23/18  2:16 PM  Result Value Ref Range   Prothrombin Time 12.6 11.4 - 15.2 seconds   INR 0.95     Comment: Performed at [facility], [place] [address], [place], [place] [address]  APTT     Status: None   Collection Time: 01/23/18  2:16 PM  Result Value Ref Range   aPTT 30 24 - 36 seconds    Comment: Performed at [facility], [place] [address], [place],  [address]  POCT i-Stat troponin I     Status: None   Collection Time: 01/23/18  2:18 PM  Result Value Ref Range   Troponin i, poc 0.00 0.00 - 0.08 ng/mL   Comment 3            Comment: Due to the release kinetics of cTnI, a negative result within the first hours of the onset of symptoms does not rule out myocardial infarction with certainty. If myocardial infarction is still suspected, repeat the test at appropriate intervals.   I-Stat beta hCG blood, ED     Status: None   Collection Time: 01/23/18  2:18 PM  Result Value Ref Range   I-stat hCG, quantitative <5.0 <5 mIU/mL   Comment 3            Comment:   GEST. AGE      CONC.  (mIU/mL)   <=1 WEEK        5 - 50     2 WEEKS       50 - 500     3 WEEKS       100 - 10,000     4 WEEKS     1,000 - 30,000  FEMALE AND NON-PREGNANT  FEMALE:     LESS THAN 5 mIU/mL   Troponin I  (0, 3)     Status: None   Collection Time: 01/23/18  5:16 PM  Result Value Ref Range   Troponin I <0.03 <0.03 ng/mL    Comment: Performed at [facility], [place] [address], [place], [place] [address]   Ct Angio Chest Pe W And/or Wo Contrast  Result Date: 01/23/2018 CLINICAL DATA:  52 year old female with acute chest pain and shortness of breath for 1 day. Elevated D-dimer. EXAM: CT ANGIOGRAPHY CHEST WITH CONTRAST TECHNIQUE: Multidetector CT imaging of the chest was performed using the standard protocol during bolus administration of intravenous contrast. Multiplanar CT image reconstructions and MIPs were obtained to evaluate the vascular anatomy. CONTRAST:  158m ISOVUE-370 IOPAMIDOL (ISOVUE-370) INJECTION 76% COMPARISON:  08/31/2017 chest CT FINDINGS: Cardiovascular: This is a technically adequate study but mild motion artifact in the LOWER lungs slightly limits sensitivity. No pulmonary emboli are identified. Cardiomegaly noted. No thoracic aortic aneurysm or pericardial effusion. A LEFT subcutaneous defibrillator is again noted. Mediastinum/Nodes: No enlarged mediastinal, hilar, or axillary lymph nodes. Thyroid gland, trachea, and esophagus demonstrate no significant findings. Lungs/Pleura: Lungs are clear. No pleural effusion or pneumothorax. Upper Abdomen: No acute abnormality Musculoskeletal: No chest wall abnormality. No acute or significant osseous findings. Review of the MIP images confirms the above findings. IMPRESSION: 1. No evidence of acute abnormality. No evidence of pulmonary emboli. 2. Cardiomegaly Electronically Signed   By: JMargarette CanadaM.D.   On: 01/23/2018 18:32   Dg Chest Port 1 View  Result Date: 01/23/2018 CLINICAL DATA:  Left-sided chest pain. EXAM: PORTABLE CHEST 1 VIEW COMPARISON:  Chest x-ray dated November 18, 2017. FINDINGS: Unchanged single lead defibrillator in the left lateral chest wall. Stable cardiomegaly.  Normal pulmonary vascularity. No focal consolidation, pleural effusion, or pneumothorax. No acute osseous abnormality. IMPRESSION: No active disease. Electronically Signed   By: WTitus DubinM.D.   On: 01/23/2018 14:23    Pending Labs Unresulted Labs (From admission, onward)    Start     Ordered   01/24/18 0500  CBC  Daily,   R     01/23/18 1558   01/23/18 2359  Heparin level (unfractionated)  Once-Timed,   R     01/23/18 1558   01/23/18 1416  Rapid urine drug screen (hospital performed)  STAT,   R     01/23/18 1418          Vitals/Pain Today's Vitals   01/23/18 1648 01/23/18 1922 01/23/18 1930 01/23/18 1953  BP: (!) 162/108 138/86 (!) 151/90   Pulse: (!) 118 (!) 119    Resp: 20 (!) 26 17   Temp:      TempSrc:      SpO2: 99% 100% 97%   Weight:      Height:      PainSc:    10-Worst pain ever    Isolation Precautions No active isolations  Medications Medications  0.9 %  sodium chloride infusion ( Intravenous Stopped 01/23/18 1954)  heparin bolus via infusion 2,000 Units (2,000 Units Intravenous Bolus from Bag 01/23/18 1610)    Followed by  heparin ADULT infusion 100 units/mL (25000 units/2546msodium chloride 0.45%) (1,150 Units/hr Intravenous New Bag/Given 01/23/18 1608)  iopamidol (ISOVUE-370) 76 % injection (has no administration in time range)  aspirin chewable tablet 324 mg (324 mg Oral Given 01/23/18 1432)  morphine 4 MG/ML injection 4 mg (4 mg Intravenous Given 01/23/18 1433)  morphine 4 MG/ML injection 4 mg (4 mg Intravenous Given 01/23/18 1602)  HYDROmorphone (DILAUDID) injection 1 mg (1 mg Intravenous Given 01/23/18 1722)  iopamidol (ISOVUE-370) 76 % injection 100 mL (100 mLs Intravenous Contrast Given 01/23/18 1754)    Mobility Walks

## 2018-01-23 NOTE — Progress Notes (Addendum)
ANTICOAGULATION CONSULT NOTE - Initial Consult  Pharmacy Consult for Heparin Indication: pulmonary embolus ==> chest pain  No Known Allergies  Patient Measurements: Height: 5\' 2"  (157.5 cm) Weight: 177 lb (80.3 kg) IBW/kg (Calculated) : 50.1 Heparin Dosing Weight: 67 kg  Vital Signs: Temp: 98.2 F (36.8 C) (08/17 1352) Temp Source: Oral (08/17 1352) BP: 155/93 (08/17 1515) Pulse Rate: 126 (08/17 1512)  Labs: Recent Labs    01/23/18 1414 01/23/18 1416  HGB 12.0  --   HCT 36.2  --   PLT 393  --   CREATININE 1.33*  --   TROPONINI  --  <0.03    Estimated Creatinine Clearance: 48.6 mL/min (A) (by C-G formula based on SCr of 1.33 mg/dL (H)).   Medical History: Past Medical History:  Diagnosis Date  . Chronic kidney disease (CKD)   . Coronary artery disease involving native heart with angina pectoris [facility]) 2014   Patient reports MI in 2013 (in [place]) -- 2nd [place] 2014 in [place], [place] --? stent placement when patient was living in [place]. Need outside records.   . Dilated cardiomyopathy ([place]) 2013   Patient reports this was secondary to MI and she to have AICD placed.   Marland Kitchen GERD (gastroesophageal reflux disease)   . Gout   . Hypertensive heart disease with chronic combined systolic and diastolic congestive heart failure ([place]) 2013  . ICD (implantable cardioverter-defibrillator) in place, [place], 2013 2013   [facility] in [place], (DR. [place])  . Unspecified mood (affective) disorder (HCC)     Medications:  No oral anticoagulation PTA  Assessment:  52 yr female presents with chest pain, dizziness and shortness of breath  PMH signficant for CKD, CAD (h/o MI), CHF, defibrillator  Goal of Therapy:  Heparin level 0.3-0.7 units/ml Monitor platelets by anticoagulation protocol: Yes   Plan:   Obtain baseline aPTT and PT/INR  Per [place] nomogram:  Heparin 2000 unit IV bolus x 1 followed by heparin infusion @ 1150  units/hr  Check heparin level 8 hr after heparin started  Follow heparin level and CBC daily while on heparin gtt  Annaleise Burger, Toribio Harbour, PharmD 01/23/2018,3:50 PM   ADDENDUM:  CTAngio negative for PE IV heparin per pharmacy to continue for ACS/STEMI Continue heparin as previously ordered and will follow up with initial heparin level @ 24:00.  Leone Haven, PharmD 01/23/18 @ 19:46

## 2018-01-23 NOTE — H&P (Addendum)
History and Physical    Kathy Bruce VQX:[id number] DOB: Mar 07, 1966 DOA: 01/23/2018  Referring MD/NP/PA: Dr. Vanita Panda  PCP: Hayden Rasmussen, MD   Outpatient Specialists: Dr. Glenetta Hew, cardiac  Patient coming from: Home  Chief Complaint: Chest pain  HPI: Kathy Bruce is a 52 y.o. female with medical history significant of presumed coronary artery disease but also dilated combined systolic and diastolic heart failure with EF of about 30% due to hypertensive cardiomyopathy and possibly nonischemic cardiomyopathy, history of gout, hypertensive urgency, severe mitral regurgitation as well as hyperlipidemia and chronic kidney disease who presented to the ER with sudden onset of chest pain lasting several minutes which is also recurrent.  Symptoms have been going on since last night.  Rated as 6 out of 10.  Improved with some pain medications and nitroglycerin.  Denied any nausea vomiting or diaphoresis.  Patient was seen and evaluated in the ER.  Initial EKG as well as enzymes were negative.  CT angiography was also reassuring but due to ongoing recurrent pain patient is being admitted for rule out MI.  ED Course: Patient's blood pressure was 162 about 112 with a heart rate of 139 respiratory rate of 26 oxygen sat 100% on room air.  Creatinine is 1.33 glucose 102 with normal white count and BUN.  Initial troponin 0 0.03.  PT 12.6 INR 0.95 EKG showed sinus tachycardia with a rate of 134 evidence of left ventricular hypertrophy, CT angiogram showed no evidence of any acute abnormalities.  Chest x-ray also showed no active disease.  Patient was started on heparin drip in the ER and placed on aspirin and statin and being admitted for observation  Review of Systems: As per HPI otherwise 10 point review of systems negative.    Past Medical History:  Diagnosis Date  . Chronic kidney disease (CKD)   . Coronary artery disease involving native heart with angina pectoris [facility]) 2014   Patient  reports MI in 2013 (in [place]) -- 2nd [place] 2014 in [place], [place] --? stent placement when patient was living in [place]. Need outside records.   . Dilated cardiomyopathy ([place]) 2013   Patient reports this was secondary to MI and she to have AICD placed.   Marland Kitchen GERD (gastroesophageal reflux disease)   . Gout   . Hypertensive heart disease with chronic combined systolic and diastolic congestive heart failure ([place]) 2013  . ICD (implantable cardioverter-defibrillator) in place, [place], 2013 2013   [facility] in [place], (DR. [place])  . Unspecified mood (affective) disorder [facility])     Past Surgical History:  Procedure Laterality Date  . CARDIAC DEFIBRILLATOR PLACEMENT  [address] [employer] ICD, subcu device, single lead. Implanted in [place]  . COLONOSCOPY WITH PROPOFOL N/A 10/05/2017   Procedure: COLONOSCOPY WITH PROPOFOL;  Surgeon: Yetta Flock, MD;  Location: [facility];  Service: Gastroenterology;  Laterality: N/A;  . TRANSTHORACIC ECHOCARDIOGRAM  06/2017   Mild LV dilation.  Normal thickness.  Mildly reduced EF of 30-35%.  Moderate diffuse HK.  GRII DD.  Moderate-severe MR.  Mild LA dilation      reports that she has never smoked. She has never used smokeless tobacco. She reports that she drinks alcohol. She reports that she does not use drugs.  No Known Allergies  Family History  Problem Relation Age of Onset  . Hyperthyroidism Mother   . Diabetes Mother   . Tuberculosis Father   . Heart attack Brother   . Hypertension Sister   .  Arthritis Sister   . Hyperthyroidism Brother   . Hypertension Brother   . Hypertension Son   . Gout Son   . Diabetes Daughter   . Stomach cancer Neg Hx   . Colon cancer Neg Hx      Prior to Admission medications   Medication Sig Start Date End Date Taking? Authorizing Provider  allopurinol (ZYLOPRIM) 300 MG tablet Take 300 mg by mouth daily. 10/02/17  Yes [provider]  aspirin EC 81 MG  tablet Take 1 tablet (81 mg total) by mouth daily. 07/05/17  Yes Oswald Hillock, MD  aspirin-acetaminophen-caffeine (EXCEDRIN MIGRAINE) [phone] MG tablet Take 2 tablets by mouth daily as needed for headache or migraine.   Yes [provider]  atorvastatin (LIPITOR) 40 MG tablet Take 1 tablet (40 mg total) by mouth daily. 08/20/17 01/23/18 Yes Leonie Man, MD  carvedilol (COREG) 25 MG tablet Take 1 tablet (25 mg total) by mouth 2 (two) times daily. 11/19/17 02/17/18 Yes Leonie Man, MD  cloNIDine (CATAPRES) 0.1 MG tablet Take 1 tablet (0.1 mg total) by mouth daily. 07/05/17  Yes Oswald Hillock, MD  colchicine 0.6 MG tablet Take 1 tablet (0.6 mg total) by mouth 2 (two) times daily. Patient taking differently: Take 0.6 mg by mouth daily.  07/05/17  Yes Oswald Hillock, MD  furosemide (LASIX) 40 MG tablet Take 1 tablet (40 mg total) by mouth daily. 08/20/17 01/23/18 Yes Leonie Man, MD  isosorbide mononitrate (IMDUR) 30 MG 24 hr tablet Take 1 tablet (30 mg total) by mouth daily. 08/20/17 01/23/18 Yes Leonie Man, MD  Multiple Vitamins-Minerals (MULTIVITAMIN GUMMIES ADULT) CHEW Chew 2 capsules by mouth daily.   Yes [provider]  naproxen (NAPROSYN) 500 MG tablet Take 1 tablet (500 mg total) by mouth 2 (two) times daily with a meal. 09/06/17  Yes Donne Hazel, MD  nitroGLYCERIN (NITROSTAT) 0.4 MG SL tablet Place 0.4 mg under the tongue every 5 (five) minutes as needed for chest pain.   Yes [provider]  Omega-3 Fatty Acids (FISH OIL PO) Take 1 capsule by mouth once a week.   Yes [provider]  omeprazole (PRILOSEC) 40 MG capsule Take 1 capsule (40 mg total) by mouth daily. 07/05/17  Yes Oswald Hillock, MD  OVER THE COUNTER MEDICATION Take 1 capsule by mouth once a week. Triflex otc supplement   Yes [provider]  oxyCODONE-acetaminophen (PERCOCET) 7.5-325 MG tablet Take 1 tablet by mouth 3 (three) times daily as needed for severe pain.   Yes  [provider]  sacubitril-valsartan (ENTRESTO) 97-103 MG Take 1 tablet by mouth 2 (two) times daily. 11/19/17  Yes Leonie Man, MD  tetrahydrozoline (VISINE) 0.05 % ophthalmic solution Place 2 drops into both eyes daily as needed (DRY EYES). 07/05/17  Yes Oswald Hillock, MD  zolpidem (AMBIEN) 10 MG tablet Take 10 mg by mouth at bedtime as needed for sleep.  08/28/17  Yes [provider]  predniSONE (DELTASONE) 10 MG tablet Take 1 tablet (10 mg total) by mouth daily with breakfast. Patient not taking: Reported on 01/23/2018 07/05/17   Oswald Hillock, MD  predniSONE (DELTASONE) 10 MG tablet Take 1 tablet (10 mg total) by mouth daily with breakfast. STARTING AFTER FINISHING THE 40MG DOSING Patient not taking: Reported on 01/23/2018 11/18/17   Street, [place], PA-C  predniSONE (DELTASONE) 5 MG tablet TAKE 2 TABLETS BY MOUTH WITH FOOD OR MILK ONCE DAILY FOR 30 DAYS 10/26/17  [provider]  SUPREP BOWEL PREP KIT 17.5-3.13-1.6 GM/177ML SOLN Suprep-Use as directed Patient not taking: Reported on 01/23/2018 09/14/17   Yetta Flock, MD    Physical Exam: Vitals:   01/23/18 1515 01/23/18 1600 01/23/18 1648 01/23/18 1922  BP: (!) 155/93 (!) 150/96 (!) 162/108 138/86  Pulse:  (!) 122 (!) 118 (!) 119  Resp:  20 20 (!) 26  Temp:  98.6 F (37 C)    TempSrc:  Oral    SpO2:  99% 99% 100%  Weight:      Height:          Constitutional: NAD, calm, comfortable Vitals:   01/23/18 1515 01/23/18 1600 01/23/18 1648 01/23/18 1922  BP: (!) 155/93 (!) 150/96 (!) 162/108 138/86  Pulse:  (!) 122 (!) 118 (!) 119  Resp:  20 20 (!) 26  Temp:  98.6 F (37 C)    TempSrc:  Oral    SpO2:  99% 99% 100%  Weight:      Height:       Eyes: PERRL, lids and conjunctivae normal ENMT: Mucous membranes are moist. Posterior pharynx clear of any exudate or lesions.Normal dentition.  Neck: normal, supple, no masses, no thyromegaly Respiratory: clear to auscultation bilaterally, no wheezing,  no crackles. Normal respiratory effort. No accessory muscle use.  Cardiovascular: Sinus tachycardia, diastolic murmur grade 4, displaced apex, positive JVD Abdomen: no tenderness, no masses palpated. No hepatosplenomegaly. Bowel sounds positive.  Musculoskeletal: no clubbing / cyanosis. No joint deformity upper and lower extremities. Good ROM, no contractures. Normal muscle tone.  Skin: no rashes, lesions, ulcers. No induration Neurologic: CN 2-12 grossly intact. Sensation intact, DTR normal. Strength 5/5 in all 4.  Psychiatric: Normal judgment and insight. Alert and oriented x 3. Normal mood.   Labs on Admission: I have personally reviewed following labs and imaging studies  CBC: Recent Labs  Lab 01/23/18 1414  WBC 7.3  HGB 12.0  HCT 36.2  MCV 95.0  PLT 159   Basic Metabolic Panel: Recent Labs  Lab 01/23/18 1414  NA 141  K 4.6  CL 107  CO2 22  GLUCOSE 102*  BUN 15  CREATININE 1.33*  CALCIUM 10.3   GFR: Estimated Creatinine Clearance: 48.6 mL/min (A) (by C-G formula based on SCr of 1.33 mg/dL (H)). Liver Function Tests: No results for input(s): AST, ALT, ALKPHOS, BILITOT, PROT, ALBUMIN in the last 168 hours. No results for input(s): LIPASE, AMYLASE in the last 168 hours. No results for input(s): AMMONIA in the last 168 hours. Coagulation Profile: Recent Labs  Lab 01/23/18 1416  INR 0.95   Cardiac Enzymes: Recent Labs  Lab 01/23/18 1416 01/23/18 1716  TROPONINI <0.03 <0.03   BNP (last 3 results) No results for input(s): PROBNP in the last 8760 hours. HbA1C: No results for input(s): HGBA1C in the last 72 hours. CBG: No results for input(s): GLUCAP in the last 168 hours. Lipid Profile: No results for input(s): CHOL, HDL, LDLCALC, TRIG, CHOLHDL, LDLDIRECT in the last 72 hours. Thyroid Function Tests: No results for input(s): TSH, T4TOTAL, FREET4, T3FREE, THYROIDAB in the last 72 hours. Anemia Panel: No results for input(s): VITAMINB12, FOLATE, FERRITIN,  TIBC, IRON, RETICCTPCT in the last 72 hours. Urine analysis:    Component Value Date/Time   COLORURINE YELLOW 11/18/2017 [place] 11/18/2017 1740   LABSPEC 1.018 11/18/2017 1740   PHURINE 5.0 11/18/2017 1740   GLUCOSEU NEGATIVE 11/18/2017 1740   HGBUR NEGATIVE 11/18/2017 1740   BILIRUBINUR NEGATIVE 11/18/2017  [place] 11/18/2017 1740   PROTEINUR 30 (A) 11/18/2017 1740   NITRITE NEGATIVE 11/18/2017 1740   LEUKOCYTESUR NEGATIVE 11/18/2017 1740   Sepsis Labs: @LABRCNTIP (procalcitonin:4,lacticidven:4) )No results found for this or any previous visit (from the past 240 hour(s)).   Radiological Exams on Admission: Ct Angio Chest Pe W And/or Wo Contrast  Result Date: 01/23/2018 CLINICAL DATA:  52 year old female with acute chest pain and shortness of breath for 1 day. Elevated D-dimer. EXAM: CT ANGIOGRAPHY CHEST WITH CONTRAST TECHNIQUE: Multidetector CT imaging of the chest was performed using the standard protocol during bolus administration of intravenous contrast. Multiplanar CT image reconstructions and MIPs were obtained to evaluate the vascular anatomy. CONTRAST:  156m ISOVUE-370 IOPAMIDOL (ISOVUE-370) INJECTION 76% COMPARISON:  08/31/2017 chest CT FINDINGS: Cardiovascular: This is a technically adequate study but mild motion artifact in the LOWER lungs slightly limits sensitivity. No pulmonary emboli are identified. Cardiomegaly noted. No thoracic aortic aneurysm or pericardial effusion. A LEFT subcutaneous defibrillator is again noted. Mediastinum/Nodes: No enlarged mediastinal, hilar, or axillary lymph nodes. Thyroid gland, trachea, and esophagus demonstrate no significant findings. Lungs/Pleura: Lungs are clear. No pleural effusion or pneumothorax. Upper Abdomen: No acute abnormality Musculoskeletal: No chest wall abnormality. No acute or significant osseous findings. Review of the MIP images confirms the above findings. IMPRESSION: 1. No evidence of acute  abnormality. No evidence of pulmonary emboli. 2. Cardiomegaly Electronically Signed   By: JMargarette CanadaM.D.   On: 01/23/2018 18:32   Dg Chest Port 1 View  Result Date: 01/23/2018 CLINICAL DATA:  Left-sided chest pain. EXAM: PORTABLE CHEST 1 VIEW COMPARISON:  Chest x-ray dated November 18, 2017. FINDINGS: Unchanged single lead defibrillator in the left lateral chest wall. Stable cardiomegaly. Normal pulmonary vascularity. No focal consolidation, pleural effusion, or pneumothorax. No acute osseous abnormality. IMPRESSION: No active disease. Electronically Signed   By: WTitus DubinM.D.   On: 01/23/2018 14:23    EKG: Independently reviewed.  Sinus tachycardia with a rate of 134, early repolarization and evidence of LVH  Assessment/Plan Principal Problem:   Chest pain with moderate risk for cardiac etiology Active Problems:   Coronary artery disease, non-occlusive   Hyperlipidemia due to dietary fat intake   HTN (hypertension)   Palpitation   Angina at rest ([facility]    #1 angina at rest: Patient had a history of heart disease.  Conflicting records as to whether she has ischemic or nonischemic cardiomyopathy.  Initial enzymes are negative but due to history and presentation will admit for observation.  Check serial enzymes.  Continue aspirin with statin beta-blockers as well as Enteresto and Imdur.  ER has already consulted cardiology and will follow recommendations.  #2 hypertension: Blood pressure is elevated.  I will start with home regimen and adjust as needed.  #3 palpitations: Most likely related to current cardiac symptoms.  She will be on her carvedilol but if rate is not controlled we will adjust dose accordingly.  #4 combined systolic and diastolic CHF: Patient's EF was 30 to 35% from echo January of this year.  We will continue with cardiac medications.  She is already being followed by cardiology.  No evidence of fluid overload at the moment.  #5 GERD: Continue with PPIs.  6 history  of gout: No evidence of gouty attacks.  Continue home regimen of allopurinol and colchicine   DVT prophylaxis: Heparin  Code Status: Full code  Family Communication: No family available so discussed care with patient in details Disposition Plan: Home  Consults  called: Cardiology called by the ER Admission status: Observation  Severity of Illness: The appropriate patient status for this patient is OBSERVATION. Observation status is judged to be reasonable and necessary in order to provide the required intensity of service to ensure the patient's safety. The patient's presenting symptoms, physical exam findings, and initial radiographic and laboratory data in the context of their medical condition is felt to place them at decreased risk for further clinical deterioration. Furthermore, it is anticipated that the patient will be medically stable for discharge from the hospital within 2 midnights of admission. The following factors support the patient status of observation.   " The patient's presenting symptoms include chest pain. " The physical exam findings include sinus tachycardia. " The initial radiographic and laboratory data are history of ischemic cardiomyopathy.     Barbette Merino MD [facility] Pager [phone]  If 7PM-7AM, please contact night-coverage [email or web address] Password [facility]  01/23/2018, 7:40 PM

## 2018-01-23 NOTE — ED Triage Notes (Addendum)
Pt presents with c/o chest pain that started last night on the left side. Pt also reporting dizziness and shortness of breath. Pt reports she has a defibrillator and has heart failure.

## 2018-01-23 NOTE — ED Provider Notes (Signed)
[place] DEPT Provider Note   CSN: [id number] Arrival date & time: 01/23/18  1347     History   Chief Complaint Chief Complaint  Patient presents with  . Chest Pain    HPI Kathy Bruce is a 52 y.o. female.  HPI Patient presents to the emergency room for evaluation of sharp burning aching left-sided chest pain that started last night.  Patient states his symptoms have been coming and going since last night.  They last minutes at a time.  She does feel short of breath when it occurs.  Pain does not radiate.  She has not had any diaphoresis nausea or vomiting.  Patient does have a history of cardiomyopathy.  Patient told me that she does not have a history of heart disease however her medical records indicate that she has history of myocardial infarction with MI and stents.  Patient denies any fevers or chills.  No cough.   Past Medical History:  Diagnosis Date  . Chronic kidney disease (CKD)   . Coronary artery disease involving native heart with angina pectoris [facility]) 2014   Patient reports MI in 2013 (in [place]) -- 2nd [place] 2014 in [place], [place] --? stent placement when patient was living in [place]. Need outside records.   . Dilated cardiomyopathy ([place]) 2013   Patient reports this was secondary to MI and she to have AICD placed.   Marland Kitchen GERD (gastroesophageal reflux disease)   . Gout   . Hypertensive heart disease with chronic combined systolic and diastolic congestive heart failure ([place]) 2013  . ICD (implantable cardioverter-defibrillator) in place, [place], 2013 2013   [facility] in [place], (DR. [place])  . Unspecified mood (affective) disorder [facility])     Patient Active Problem List   Diagnosis Date Noted  . Colon cancer screening   . Gout 09/01/2017  . Renal insufficiency 09/01/2017  . HTN (hypertension) 09/01/2017  . Acute lower UTI 09/01/2017  . SIRS (systemic inflammatory response syndrome)  ([place]) 08/31/2017  . Hyperlipidemia due to dietary fat intake 08/20/2017  . Hypertensive urgency 07/02/2017  . Chest pain with moderate risk for cardiac etiology 07/02/2017  . ICD (implantable cardioverter-defibrillator) in place, [facility], 2013 07/02/2017  . UTI (urinary tract infection) 07/02/2017  . Chest pain 07/02/2017  . Coronary artery disease, non-occlusive 06/09/2012  . Hypertensive heart disease with chronic combined systolic and diastolic congestive heart failure ([place]) 06/10/2011  . Dilated cardiomyopathy ([place]) 06/10/2011    Past Surgical History:  Procedure Laterality Date  . CARDIAC DEFIBRILLATOR PLACEMENT  [address] [employer] ICD, subcu device, single lead. Implanted in [place]  . COLONOSCOPY WITH PROPOFOL N/A 10/05/2017   Procedure: COLONOSCOPY WITH PROPOFOL;  Surgeon: Yetta Flock, MD;  Location: [facility] ENDOSCOPY;  Service: Gastroenterology;  Laterality: N/A;  . TRANSTHORACIC ECHOCARDIOGRAM  06/2017   Mild LV dilation.  Normal thickness.  Mildly reduced EF of 30-35%.  Moderate diffuse HK.  GRII DD.  Moderate-severe MR.  Mild LA dilation      OB History   None      Home Medications    Prior to Admission medications   Medication Sig Start Date End Date Taking? Authorizing Provider  allopurinol (ZYLOPRIM) 300 MG tablet Take 300 mg by mouth daily. 10/02/17  Yes [provider]  aspirin EC 81 MG tablet Take 1 tablet (81 mg total) by mouth daily. 07/05/17  Yes Oswald Hillock, MD  aspirin-acetaminophen-caffeine (EXCEDRIN MIGRAINE) [phone] MG tablet Take 2 tablets  by mouth daily as needed for headache or migraine.   Yes [provider]  atorvastatin (LIPITOR) 40 MG tablet Take 1 tablet (40 mg total) by mouth daily. 08/20/17 01/23/18 Yes Leonie Man, MD  carvedilol (COREG) 25 MG tablet Take 1 tablet (25 mg total) by mouth 2 (two) times daily. 11/19/17 02/17/18 Yes Leonie Man, MD  cloNIDine (CATAPRES) 0.1 MG tablet Take 1 tablet (0.1 mg total) by  mouth daily. 07/05/17  Yes Oswald Hillock, MD  colchicine 0.6 MG tablet Take 1 tablet (0.6 mg total) by mouth 2 (two) times daily. Patient taking differently: Take 0.6 mg by mouth daily.  07/05/17  Yes Oswald Hillock, MD  furosemide (LASIX) 40 MG tablet Take 1 tablet (40 mg total) by mouth daily. 08/20/17 01/23/18 Yes Leonie Man, MD  isosorbide mononitrate (IMDUR) 30 MG 24 hr tablet Take 1 tablet (30 mg total) by mouth daily. 08/20/17 01/23/18 Yes Leonie Man, MD  Multiple Vitamins-Minerals (MULTIVITAMIN GUMMIES ADULT) CHEW Chew 2 capsules by mouth daily.   Yes [provider]  naproxen (NAPROSYN) 500 MG tablet Take 1 tablet (500 mg total) by mouth 2 (two) times daily with a meal. 09/06/17  Yes Donne Hazel, MD  nitroGLYCERIN (NITROSTAT) 0.4 MG SL tablet Place 0.4 mg under the tongue every 5 (five) minutes as needed for chest pain.   Yes [provider]  Omega-3 Fatty Acids (FISH OIL PO) Take 1 capsule by mouth once a week.   Yes [provider]  omeprazole (PRILOSEC) 40 MG capsule Take 1 capsule (40 mg total) by mouth daily. 07/05/17  Yes Oswald Hillock, MD  OVER THE COUNTER MEDICATION Take 1 capsule by mouth once a week. Triflex otc supplement   Yes [provider]  oxyCODONE-acetaminophen (PERCOCET) 7.5-325 MG tablet Take 1 tablet by mouth 3 (three) times daily as needed for severe pain.   Yes [provider]  sacubitril-valsartan (ENTRESTO) 97-103 MG Take 1 tablet by mouth 2 (two) times daily. 11/19/17  Yes Leonie Man, MD  tetrahydrozoline (VISINE) 0.05 % ophthalmic solution Place 2 drops into both eyes daily as needed (DRY EYES). 07/05/17  Yes Oswald Hillock, MD  zolpidem (AMBIEN) 10 MG tablet Take 10 mg by mouth at bedtime as needed for sleep.  08/28/17  Yes [provider]  predniSONE (DELTASONE) 10 MG tablet Take 1 tablet (10 mg total) by mouth daily with breakfast. Patient not taking: Reported on 01/23/2018 07/05/17   Oswald Hillock, MD   predniSONE (DELTASONE) 10 MG tablet Take 1 tablet (10 mg total) by mouth daily with breakfast. STARTING AFTER FINISHING THE 40MG DOSING Patient not taking: Reported on 01/23/2018 11/18/17   Street, [place], PA-C  predniSONE (DELTASONE) 5 MG tablet TAKE 2 TABLETS BY MOUTH WITH FOOD OR MILK ONCE DAILY FOR 30 DAYS 10/26/17   [provider]  [place] 17.5-3.13-1.6 GM/177ML SOLN Suprep-Use as directed Patient not taking: Reported on 01/23/2018 09/14/17   Armbruster, Carlota Raspberry, MD    Family History Family History  Problem Relation Age of Onset  . Hyperthyroidism Mother   . Diabetes Mother   . Tuberculosis Father   . Heart attack Brother   . Hypertension Sister   . Arthritis Sister   . Hyperthyroidism Brother   . Hypertension Brother   . Hypertension Son   . Gout Son   . Diabetes Daughter   . Stomach cancer Neg Hx   . Colon cancer Neg Hx  Social History Social History   Tobacco Use  . Smoking status: Never Smoker  . Smokeless tobacco: Never Used  Substance Use Topics  . Alcohol use: Yes    Frequency: Never    Comment: Occasionally  . Drug use: No     Allergies   Patient has no known allergies.   Review of Systems Review of Systems  Musculoskeletal: Positive for joint swelling.       Patient states she is having a gout flare in her left foot  All other systems reviewed and are negative.    Physical Exam Updated Vital Signs BP (!) 150/96 (BP Location: Right Arm)   Pulse (!) 122   Temp 98.6 F (37 C) (Oral)   Resp 20   Ht 1.575 m (5' 2" )   Wt 80.3 kg   SpO2 99%   BMI 32.37 kg/m   Physical Exam  Constitutional: She appears well-developed and well-nourished. No distress.  HENT:  Head: Normocephalic and atraumatic.  Right Ear: External ear normal.  Left Ear: External ear normal.  Eyes: Conjunctivae are normal. Right eye exhibits no discharge. Left eye exhibits no discharge. No scleral icterus.  Neck: Neck supple. No tracheal deviation  present.  Cardiovascular: Regular rhythm and intact distal pulses. Tachycardia present.  Pulmonary/Chest: Effort normal and breath sounds normal. No stridor. No respiratory distress. She has no wheezes. She has no rales.  Abdominal: Soft. Bowel sounds are normal. She exhibits no distension. There is no tenderness. There is no rebound and no guarding.  Musculoskeletal: She exhibits no edema or tenderness.  Tenderness palpation left foot, no erythema or edema  Neurological: She is alert. She has normal strength. No cranial nerve deficit (no facial droop, extraocular movements intact, no slurred speech) or sensory deficit. She exhibits normal muscle tone. She displays no seizure activity. Coordination normal.  Skin: Skin is warm and dry. No rash noted.  Psychiatric: She has a normal mood and affect.  Nursing note and vitals reviewed.    ED Treatments / Results  Labs (all labs ordered are listed, but only abnormal results are displayed) Labs Reviewed  BASIC METABOLIC PANEL - Abnormal; Notable for the following components:      Result Value   Glucose, Bld 102 (*)    Creatinine, Ser 1.33 (*)    GFR calc non Af Amer 45 (*)    GFR calc Af Amer 52 (*)    All other components within normal limits  CBC - Abnormal; Notable for the following components:   RBC 3.81 (*)    All other components within normal limits  D-DIMER, QUANTITATIVE (NOT AT [facility]) - Abnormal; Notable for the following components:   D-Dimer, Quant 1.20 (*)    All other components within normal limits  TROPONIN I  BRAIN NATRIURETIC PEPTIDE  PROTIME-INR  APTT  TROPONIN I  RAPID URINE DRUG SCREEN, HOSP PERFORMED  HEPARIN LEVEL (UNFRACTIONATED)  POCT I-STAT TROPONIN I  I-STAT BETA HCG BLOOD, ED (NOT ORDERABLE)    EKG EKG Interpretation  Date/Time:  Saturday January 23 2018 13:52:27 EDT Ventricular Rate:  139 PR Interval:    QRS Duration: 97 QT Interval:  286 QTC Calculation: 435 R Axis:   13 Text Interpretation:   Sinus tachycardia LVH with secondary repolarization abnormality No significant change since last tracing Confirmed by Dorie Rank [phone]) on 01/23/2018 2:12:46 PM   Radiology Dg Chest Port 1 View  Result Date: 01/23/2018 CLINICAL DATA:  Left-sided chest pain. EXAM: PORTABLE CHEST 1 VIEW COMPARISON:  Chest x-ray dated November 18, 2017. FINDINGS: Unchanged single lead defibrillator in the left lateral chest wall. Stable cardiomegaly. Normal pulmonary vascularity. No focal consolidation, pleural effusion, or pneumothorax. No acute osseous abnormality. IMPRESSION: No active disease. Electronically Signed   By: Titus Dubin M.D.   On: 01/23/2018 14:23    Procedures Procedures (including critical care time)  Medications Ordered in ED Medications  0.9 %  sodium chloride infusion ( Intravenous New Bag/Given 01/23/18 1433)  heparin bolus via infusion 2,000 Units (2,000 Units Intravenous Bolus from Bag 01/23/18 1610)    Followed by  heparin ADULT infusion 100 units/mL (25000 units/251m sodium chloride 0.45%) (1,150 Units/hr Intravenous New Bag/Given 01/23/18 1608)  aspirin chewable tablet 324 mg (324 mg Oral Given 01/23/18 1432)  morphine 4 MG/ML injection 4 mg (4 mg Intravenous Given 01/23/18 1433)  morphine 4 MG/ML injection 4 mg (4 mg Intravenous Given 01/23/18 1602)     Initial Impression / Assessment and Plan / ED Course  I have reviewed the triage vital signs and the nursing notes.  Pertinent labs & imaging results that were available during my care of the patient were reviewed by me and considered in my medical decision making (see chart for details).  Clinical Course as of Jan 24 1620  Sat Jan 23, 2018  1Daltonnotable for initial troponin being normal.  D-dimer is elevated 1.20.  Patient is tachycardic.  We will proceed with CT scan   [JK]  1536 Will start heparin empirically with the d dimer and chest pain symptoms   [JK]    Clinical Course User Index [JK] KDorie Rank MD   Patient  presents to the emergency room for evaluation of chest pain.  Patient has a known history of coronary artery disease.  She has a moderate risk heart score.  She is tachycardic and has an elevated d-dimer.  CT angiogram of the chest has been ordered.  I have also started on empiric heparin and she has been given morphine for pain ( pt is also complaining of foot pain that has been diagnosed as gout, no signs of infection or acute vascular compromise).  Patient will need to be admitted to the hospital for further cardiac work-up if her CT scan is negative.  Dr LVanita Pandawill follow up on the CT scan   Final Clinical Impressions(s) / ED Diagnoses   Final diagnoses:  Chest pain, unspecified type  Acute gout of left foot, unspecified cause      KDorie Rank MD 01/23/18 1622

## 2018-01-23 NOTE — ED Notes (Signed)
Bed: WA18 Expected date:  Expected time:  Means of arrival:  Comments: ResA 

## 2018-01-23 NOTE — ED Notes (Addendum)
Pt states that she told previous tech that she was not capable of getting up to provide urine sample and needed bedpan. NT Kienan Doublin H offered to use purewick, pt stated that she would just rather wait until she is moved upstairs

## 2018-01-24 ENCOUNTER — Inpatient Hospital Stay (HOSPITAL_COMMUNITY): Payer: Medicaid Other

## 2018-01-24 ENCOUNTER — Other Ambulatory Visit: Payer: Self-pay

## 2018-01-24 DIAGNOSIS — N171 Acute kidney failure with acute cortical necrosis: Secondary | ICD-10-CM

## 2018-01-24 DIAGNOSIS — E7849 Other hyperlipidemia: Secondary | ICD-10-CM

## 2018-01-24 DIAGNOSIS — E785 Hyperlipidemia, unspecified: Secondary | ICD-10-CM | POA: Diagnosis present

## 2018-01-24 DIAGNOSIS — I110 Hypertensive heart disease with heart failure: Secondary | ICD-10-CM

## 2018-01-24 DIAGNOSIS — I130 Hypertensive heart and chronic kidney disease with heart failure and stage 1 through stage 4 chronic kidney disease, or unspecified chronic kidney disease: Secondary | ICD-10-CM | POA: Diagnosis present

## 2018-01-24 DIAGNOSIS — K219 Gastro-esophageal reflux disease without esophagitis: Secondary | ICD-10-CM | POA: Diagnosis present

## 2018-01-24 DIAGNOSIS — I361 Nonrheumatic tricuspid (valve) insufficiency: Secondary | ICD-10-CM

## 2018-01-24 DIAGNOSIS — R079 Chest pain, unspecified: Secondary | ICD-10-CM

## 2018-01-24 DIAGNOSIS — N182 Chronic kidney disease, stage 2 (mild): Secondary | ICD-10-CM

## 2018-01-24 DIAGNOSIS — R000 Tachycardia, unspecified: Secondary | ICD-10-CM | POA: Diagnosis present

## 2018-01-24 DIAGNOSIS — I340 Nonrheumatic mitral (valve) insufficiency: Secondary | ICD-10-CM | POA: Diagnosis present

## 2018-01-24 DIAGNOSIS — M109 Gout, unspecified: Secondary | ICD-10-CM | POA: Diagnosis present

## 2018-01-24 DIAGNOSIS — I252 Old myocardial infarction: Secondary | ICD-10-CM | POA: Diagnosis not present

## 2018-01-24 DIAGNOSIS — Z791 Long term (current) use of non-steroidal anti-inflammatories (NSAID): Secondary | ICD-10-CM | POA: Diagnosis not present

## 2018-01-24 DIAGNOSIS — I2510 Atherosclerotic heart disease of native coronary artery without angina pectoris: Secondary | ICD-10-CM

## 2018-01-24 DIAGNOSIS — Z8249 Family history of ischemic heart disease and other diseases of the circulatory system: Secondary | ICD-10-CM | POA: Diagnosis not present

## 2018-01-24 DIAGNOSIS — I420 Dilated cardiomyopathy: Secondary | ICD-10-CM | POA: Diagnosis present

## 2018-01-24 DIAGNOSIS — N189 Chronic kidney disease, unspecified: Secondary | ICD-10-CM | POA: Diagnosis present

## 2018-01-24 DIAGNOSIS — I5042 Chronic combined systolic (congestive) and diastolic (congestive) heart failure: Secondary | ICD-10-CM | POA: Diagnosis present

## 2018-01-24 DIAGNOSIS — Z7982 Long term (current) use of aspirin: Secondary | ICD-10-CM | POA: Diagnosis not present

## 2018-01-24 DIAGNOSIS — Z79899 Other long term (current) drug therapy: Secondary | ICD-10-CM | POA: Diagnosis not present

## 2018-01-24 DIAGNOSIS — I25119 Atherosclerotic heart disease of native coronary artery with unspecified angina pectoris: Secondary | ICD-10-CM | POA: Diagnosis not present

## 2018-01-24 LAB — HEPARIN LEVEL (UNFRACTIONATED)
HEPARIN UNFRACTIONATED: 0.61 [IU]/mL (ref 0.30–0.70)
Heparin Unfractionated: 0.71 IU/mL — ABNORMAL HIGH (ref 0.30–0.70)

## 2018-01-24 LAB — BASIC METABOLIC PANEL
Anion gap: 9 (ref 5–15)
BUN: 11 mg/dL (ref 6–20)
CALCIUM: 8.8 mg/dL — AB (ref 8.9–10.3)
CO2: 24 mmol/L (ref 22–32)
Chloride: 104 mmol/L (ref 98–111)
Creatinine, Ser: 1.07 mg/dL — ABNORMAL HIGH (ref 0.44–1.00)
GFR calc Af Amer: >60 mL/min (ref >60)
GFR, EST NON AFRICAN AMERICAN: 59 mL/min — AB (ref >60)
GLUCOSE: 115 mg/dL — AB (ref 70–99)
POTASSIUM: 3.5 mmol/L (ref 3.5–5.1)
Sodium: 137 mmol/L (ref 135–145)

## 2018-01-24 LAB — ECHOCARDIOGRAM COMPLETE
Height: 62 in
Weight: 2832 oz

## 2018-01-24 LAB — CBC
HEMATOCRIT: 31.6 % — AB (ref 36.0–46.0)
Hemoglobin: 10.4 g/dL — ABNORMAL LOW (ref 12.0–15.0)
MCH: 31.3 pg (ref 26.0–34.0)
MCHC: 32.9 g/dL (ref 30.0–36.0)
MCV: 95.2 fL (ref 78.0–100.0)
Platelets: 321 10*3/uL (ref 150–400)
RBC: 3.32 MIL/uL — AB (ref 3.87–5.11)
RDW: 13.6 % (ref 11.5–15.5)
WBC: 6.9 10*3/uL (ref 4.0–10.5)

## 2018-01-24 MED ORDER — KETOROLAC TROMETHAMINE 15 MG/ML IJ SOLN
15.0000 mg | Freq: Four times a day (QID) | INTRAMUSCULAR | Status: DC | PRN
  Administered 2018-01-24: 15 mg via INTRAVENOUS
  Filled 2018-01-24: qty 1

## 2018-01-24 MED ORDER — COLCHICINE 0.6 MG PO TABS
0.6000 mg | ORAL_TABLET | Freq: Every day | ORAL | Status: DC
  Administered 2018-01-24 (×2): 0.6 mg via ORAL
  Filled 2018-01-24: qty 1

## 2018-01-24 MED ORDER — PREDNISONE 50 MG PO TABS: 50.0000 mg | ORAL_TABLET | Freq: Every day | ORAL | Status: DC

## 2018-01-24 MED ORDER — IVABRADINE HCL 5 MG PO TABS
5.0000 mg | ORAL_TABLET | Freq: Two times a day (BID) | ORAL | Status: DC
  Administered 2018-01-24: 5 mg via ORAL
  Filled 2018-01-24 (×2): qty 1

## 2018-01-24 MED ORDER — PREDNISONE 20 MG PO TABS
40.0000 mg | ORAL_TABLET | Freq: Every day | ORAL | Status: DC
  Administered 2018-01-24: 40 mg via ORAL
  Filled 2018-01-24: qty 2

## 2018-01-24 MED ORDER — MORPHINE SULFATE (PF) 2 MG/ML IV SOLN
2.0000 mg | Freq: Once | INTRAVENOUS | Status: AC
  Administered 2018-01-24: 2 mg via INTRAVENOUS
  Filled 2018-01-24 (×2): qty 1

## 2018-01-24 MED ORDER — KETOROLAC TROMETHAMINE 15 MG/ML IJ SOLN: 15.0000 mg | Freq: Four times a day (QID) | INTRAMUSCULAR | Status: DC | PRN

## 2018-01-24 MED ORDER — KETOROLAC TROMETHAMINE 30 MG/ML IJ SOLN: 30.0000 mg | Freq: Once | INTRAMUSCULAR | Status: DC

## 2018-01-24 NOTE — Progress Notes (Signed)
PROGRESS NOTE    Kathy Bruce  VEH:[id number] DOB: May 03, 1966 DOA: 01/23/2018 PCP: Hayden Rasmussen, MD     Brief Narrative:  Kathy Bruce is a 52 y.o. female with medical history significant of presumed coronary artery disease but also dilated combined systolic and diastolic heart failure with EF of about 30% due to hypertensive cardiomyopathy and possibly nonischemic cardiomyopathy, history of gout, hypertensive urgency, severe mitral regurgitation as well as hyperlipidemia and chronic kidney disease who presented to the ER with sudden onset of chest pain lasting several minutes which is also recurrent.  Symptoms have been going on since last night.  Rated as 6 out of 10.  Improved with some pain medications and nitroglycerin.  Denied any nausea vomiting or diaphoresis.  Patient was seen and evaluated in the ER.  Initial EKG as well as enzymes were negative.  CT angiography was also reassuring but due to ongoing recurrent pain patient is being admitted for rule out MI.   New events last 24 hours / Subjective: Continues to have central chest pain which is sharp and burning in nature, reproducible to palpation but similar in feeling to her previous chest pain episodes. Also complains of left foot gout flare.   Assessment & Plan:   Principal Problem:   Chest pain with moderate risk for cardiac etiology Active Problems:   Chest pain   Coronary artery disease, non-occlusive   Hyperlipidemia due to dietary fat intake   HTN (hypertension)   Palpitation   Angina at rest [facility])   Chest pain -Troponin neg x3 -Elevated d-dimer, negative CTA chest for PE  -Aspirin, lipitor  -Heparin gtt  -Echo pending -Cardiology planning for heart cath  HTN -Coreg, corlanor, imdur, entresto, lasix   Chronic combined systolic and diastolic heart failure -Without exacerbation. BNP 15.7  -Echo 07/03/17: EF 83-66%, grade 2 diastolic dysfunction  -Entresto, lasix   Gout flare -Continue allopurinol,  colchicine. Due to lack of relief, also added IV toradol and prednisone burst   GERD -PPI    DVT prophylaxis: Heparin gtt Code Status: Full Family Communication: At bedside, sleeping Disposition Plan: Pending work up   Consultants:   Cardiology  Procedures:   None   Antimicrobials:  Anti-infectives (From admission, onward)   None        Objective: Vitals:   01/23/18 1922 01/23/18 1930 01/23/18 2039 01/24/18 0519  BP: 138/86 (!) 151/90 (!) 159/100 122/85  Pulse: (!) 119  (!) 113 97  Resp: (!) 26 17 18 18   Temp:   98.7 F (37.1 C) 98.9 F (37.2 C)  TempSrc:   Oral Oral  SpO2: 100% 97% 97% 94%  Weight:      Height:        Intake/Output Summary (Last 24 hours) at 01/24/2018 1047 Last data filed at 01/24/2018 0600 Gross per 24 hour  Intake 1100.67 ml  Output -  Net 1100.67 ml   Filed Weights   01/23/18 1350  Weight: 80.3 kg    Examination:  General exam: Appears calm but uncomfortable due to gout pain  Respiratory system: Clear to auscultation. Respiratory effort normal. Cardiovascular system: S1 & S2 heard, tachycardic, regular rhythm. No JVD, murmurs, rubs, gallops or clicks. No pedal edema. Gastrointestinal system: Abdomen is nondistended, soft and nontender. No organomegaly or masses felt. Normal bowel sounds heard. Central nervous system: Alert and oriented. No focal neurological deficits. Extremities: Symmetric, +bilateral dorsal feet with TTP, warm to touch  Skin: No rashes, lesions or ulcers Psychiatry: Judgement and insight appear  normal. Mood & affect appropriate.   Data Reviewed: I have personally reviewed following labs and imaging studies  CBC: Recent Labs  Lab 01/23/18 1414 01/24/18 0038  WBC 7.3 6.9  HGB 12.0 10.4*  HCT 36.2 31.6*  MCV 95.0 95.2  PLT 393 338   Basic Metabolic Panel: Recent Labs  Lab 01/23/18 1414 01/24/18 0949  NA 141 137  K 4.6 3.5  CL 107 104  CO2 22 24  GLUCOSE 102* 115*  BUN 15 11  CREATININE 1.33*  1.07*  CALCIUM 10.3 8.8*   GFR: Estimated Creatinine Clearance: 60.4 mL/min (A) (by C-G formula based on SCr of 1.07 mg/dL (H)). Liver Function Tests: No results for input(s): AST, ALT, ALKPHOS, BILITOT, PROT, ALBUMIN in the last 168 hours. No results for input(s): LIPASE, AMYLASE in the last 168 hours. No results for input(s): AMMONIA in the last 168 hours. Coagulation Profile: Recent Labs  Lab 01/23/18 1416  INR 0.95   Cardiac Enzymes: Recent Labs  Lab 01/23/18 1416 01/23/18 1716  TROPONINI <0.03 <0.03   BNP (last 3 results) No results for input(s): PROBNP in the last 8760 hours. HbA1C: No results for input(s): HGBA1C in the last 72 hours. CBG: No results for input(s): GLUCAP in the last 168 hours. Lipid Profile: No results for input(s): CHOL, HDL, LDLCALC, TRIG, CHOLHDL, LDLDIRECT in the last 72 hours. Thyroid Function Tests: No results for input(s): TSH, T4TOTAL, FREET4, T3FREE, THYROIDAB in the last 72 hours. Anemia Panel: No results for input(s): VITAMINB12, FOLATE, FERRITIN, TIBC, IRON, RETICCTPCT in the last 72 hours. Sepsis Labs: No results for input(s): PROCALCITON, LATICACIDVEN in the last 168 hours.  No results found for this or any previous visit (from the past 240 hour(s)).     Radiology Studies: Ct Angio Chest Pe W And/or Wo Contrast  Result Date: 01/23/2018 CLINICAL DATA:  52 year old female with acute chest pain and shortness of breath for 1 day. Elevated D-dimer. EXAM: CT ANGIOGRAPHY CHEST WITH CONTRAST TECHNIQUE: Multidetector CT imaging of the chest was performed using the standard protocol during bolus administration of intravenous contrast. Multiplanar CT image reconstructions and MIPs were obtained to evaluate the vascular anatomy. CONTRAST:  172mL ISOVUE-370 IOPAMIDOL (ISOVUE-370) INJECTION 76% COMPARISON:  08/31/2017 chest CT FINDINGS: Cardiovascular: This is a technically adequate study but mild motion artifact in the LOWER lungs slightly limits  sensitivity. No pulmonary emboli are identified. Cardiomegaly noted. No thoracic aortic aneurysm or pericardial effusion. A LEFT subcutaneous defibrillator is again noted. Mediastinum/Nodes: No enlarged mediastinal, hilar, or axillary lymph nodes. Thyroid gland, trachea, and esophagus demonstrate no significant findings. Lungs/Pleura: Lungs are clear. No pleural effusion or pneumothorax. Upper Abdomen: No acute abnormality Musculoskeletal: No chest wall abnormality. No acute or significant osseous findings. Review of the MIP images confirms the above findings. IMPRESSION: 1. No evidence of acute abnormality. No evidence of pulmonary emboli. 2. Cardiomegaly Electronically Signed   By: Margarette Canada M.D.   On: 01/23/2018 18:32   Dg Chest Port 1 View  Result Date: 01/23/2018 CLINICAL DATA:  Left-sided chest pain. EXAM: PORTABLE CHEST 1 VIEW COMPARISON:  Chest x-ray dated November 18, 2017. FINDINGS: Unchanged single lead defibrillator in the left lateral chest wall. Stable cardiomegaly. Normal pulmonary vascularity. No focal consolidation, pleural effusion, or pneumothorax. No acute osseous abnormality. IMPRESSION: No active disease. Electronically Signed   By: Titus Dubin M.D.   On: 01/23/2018 14:23      Scheduled Meds: . allopurinol  300 mg Oral Daily  . aspirin EC  81 mg  Oral Daily  . atorvastatin  40 mg Oral QHS  . carvedilol  25 mg Oral BID WC  . cloNIDine  0.1 mg Oral Daily  . colchicine  0.6 mg Oral Daily  . furosemide  40 mg Oral Daily  . isosorbide mononitrate  30 mg Oral Daily  . ivabradine  5 mg Oral BID WC  . multivitamin with minerals  1 tablet Oral Daily  . omega-3 acid ethyl esters  1 g Oral Daily  . pantoprazole  40 mg Oral Daily  . predniSONE  40 mg Oral Q breakfast  . sacubitril-valsartan  1 tablet Oral BID   Continuous Infusions: . heparin 1,050 Units/hr (01/24/18 1029)     LOS: 0 days    Time spent: 35 minutes   Dessa Phi, DO [facility] [email or web address] Password [facility] 01/24/2018, 10:47 AM

## 2018-01-24 NOTE — Progress Notes (Signed)
ANTICOAGULATION CONSULT NOTE - Follow Up Consult  Pharmacy Consult for Heparin Indication: pulmonary embolus ==> chest pain  No Known Allergies  Patient Measurements: Height: 5\' 2"  (157.5 cm) Weight: 177 lb (80.3 kg) IBW/kg (Calculated) : 50.1 Heparin Dosing Weight:   Vital Signs: Temp: 98.7 F (37.1 C) (08/17 2039) Temp Source: Oral (08/17 2039) BP: 159/100 (08/17 2039) Pulse Rate: 113 (08/17 2039)  Labs: Recent Labs    01/23/18 1414 01/23/18 1416 01/23/18 1716 01/24/18 0038  HGB 12.0  --   --  10.4*  HCT 36.2  --   --  31.6*  PLT 393  --   --  321  APTT  --  30  --   --   LABPROT  --  12.6  --   --   INR  --  0.95  --   --   HEPARINUNFRC  --   --   --  0.71*  CREATININE 1.33*  --   --   --   TROPONINI  --  <0.03 <0.03  --     Estimated Creatinine Clearance: 48.6 mL/min (A) (by C-G formula based on SCr of 1.33 mg/dL (H)).   Medications:  Infusions:  . sodium chloride Stopped (01/23/18 1954)  . heparin 1,150 Units/hr (01/23/18 1608)    Assessment: Patient with high heparin level.  No heparin issues per RN.  Goal of Therapy:  Heparin level 0.3-0.7 units/ml Monitor platelets by anticoagulation protocol: Yes   Plan:  Decrease heparin to 1050 units/hr Recheck level at 0900  Tyler Deis, Shea Stakes [place] 01/24/2018,2:25 AM

## 2018-01-24 NOTE — Progress Notes (Signed)
  Echocardiogram 2D Echocardiogram has been performed.  Merrie Roof F 01/24/2018, 12:48 PM

## 2018-01-24 NOTE — Discharge Summary (Signed)
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 Symptoms have been going on since last night. Rated as 6 out of 10. Improved with some pain medications and nitroglycerin. Denied any nausea vomiting or diaphoresis. Patient was seen and evaluated in the ER. Initial EKG as well as enzymes were negative. CT angiography was also reassuring but due to ongoing recurrent pain patient is being admitted for rule out MI. Work up revealed negative troponin. Echocardiogram completed. She was evaluated by cardiology and recommended for heart cath. Due to family emergency, she decided to signout AMA.   Discharge Instructions   Allergies as of 01/24/2018   No Known Allergies     No Known Allergies  Consultations:  Cardiology    Procedures/Studies: Ct Angio Chest Pe W And/or Wo Contrast  Result Date: 01/23/2018 CLINICAL DATA:  52 year old female with acute chest pain and shortness of breath for 1 day. Elevated D-dimer. EXAM: CT ANGIOGRAPHY CHEST WITH CONTRAST TECHNIQUE: Multidetector CT imaging of the chest was performed using the standard protocol during bolus administration of intravenous contrast. Multiplanar CT image reconstructions and MIPs were obtained to evaluate the vascular anatomy. CONTRAST:  180mL ISOVUE-370 IOPAMIDOL (ISOVUE-370)  INJECTION 76% COMPARISON:  08/31/2017 chest CT FINDINGS: Cardiovascular: This is a technically adequate study but mild motion artifact in the LOWER lungs slightly limits sensitivity. No pulmonary emboli are identified. Cardiomegaly noted. No thoracic aortic aneurysm or pericardial effusion. A LEFT subcutaneous defibrillator is again noted. Mediastinum/Nodes: No enlarged mediastinal, hilar, or axillary lymph nodes. Thyroid gland, trachea, and esophagus demonstrate no significant findings. Lungs/Pleura: Lungs are clear. No pleural effusion or pneumothorax. Upper Abdomen: No acute abnormality Musculoskeletal: No chest wall abnormality. No acute or significant osseous findings. Review of the MIP images confirms the above findings. IMPRESSION: 1. No evidence of acute abnormality. No evidence of pulmonary emboli. 2. Cardiomegaly Electronically Signed   By: Margarette Canada M.D.   On: 01/23/2018 18:32   Dg Chest Port 1 View  Result Date: 01/23/2018 CLINICAL DATA:  Left-sided chest pain. EXAM: PORTABLE CHEST 1 VIEW COMPARISON:  Chest x-ray dated November 18, 2017. FINDINGS: Unchanged single lead defibrillator in the left lateral chest wall. Stable cardiomegaly. Normal pulmonary vascularity. No focal consolidation, pleural effusion, or pneumothorax. No acute osseous abnormality. IMPRESSION: No active disease. Electronically Signed   By: Titus Dubin M.D.   On: 01/23/2018 14:23       The results of significant diagnostics from this hospitalization (including imaging, microbiology, ancillary and laboratory) are listed below for reference.     Microbiology: No results found for this or any previous visit (from the past 240 hour(s)).   Labs: BNP (last 3 results) Recent Labs    11/18/17 1238 01/23/18 1416  BNP 20.8 85.0   Basic Metabolic Panel: Recent Labs  Lab 01/23/18 1414 01/24/18 0949  NA 141 137  K 4.6 3.5  CL 107 104  CO2 22 24  GLUCOSE 102* 115*  BUN 15 11  CREATININE 1.33* 1.07*  CALCIUM 10.3  8.8*   Liver Function Tests: No results for input(s): AST, ALT, ALKPHOS, BILITOT, PROT, ALBUMIN in the last 168 hours. No results for input(s): LIPASE, AMYLASE in the last 168 hours. No results for input(s): AMMONIA in the last 168 hours. CBC: Recent Labs  Lab 01/23/18 1414 01/24/18 0038  WBC 7.3 6.9  HGB 12.0 10.4*  HCT 36.2 31.6*  MCV 95.0 95.2  PLT 393 321   Cardiac Enzymes: Recent Labs  Lab 01/23/18 1416 01/23/18 1716  TROPONINI <0.03 <0.03   BNP: Invalid input(s): POCBNP CBG: No results for input(s): GLUCAP in the last 168 hours. D-Dimer Recent Labs    01/23/18 1416  DDIMER 1.20*   Hgb A1c No results for input(s): HGBA1C in the last 72 hours. Lipid Profile No results for input(s): CHOL, HDL, LDLCALC, TRIG, CHOLHDL, LDLDIRECT in the last 72 hours. Thyroid function studies No results for input(s): TSH, T4TOTAL, T3FREE, THYROIDAB in the last 72 hours.  Invalid input(s): FREET3 Anemia work up No results for input(s): VITAMINB12, FOLATE, FERRITIN, TIBC, IRON, RETICCTPCT in the last 72 hours. Urinalysis    Component Value Date/Time   COLORURINE YELLOW 11/18/2017 1740   APPEARANCEUR CLEAR 11/18/2017 1740   LABSPEC 1.018 11/18/2017 1740   PHURINE 5.0 11/18/2017 1740   GLUCOSEU NEGATIVE 11/18/2017 1740   HGBUR NEGATIVE 11/18/2017 1740   BILIRUBINUR NEGATIVE 11/18/2017 1740   KETONESUR NEGATIVE 11/18/2017 1740   PROTEINUR 30 (A) 11/18/2017 1740   NITRITE NEGATIVE 11/18/2017 1740   LEUKOCYTESUR NEGATIVE 11/18/2017 1740   Sepsis Labs Invalid input(s): PROCALCITONIN,  WBC,  LACTICIDVEN Microbiology No results found for this or any previous visit (from the past 240 hour(s)).  SIGNED:  Dessa Phi, DO [facility] Pager [phone]  If 7PM-7AM, please contact night-coverage [email or web address] Password [email or web address] 01/24/2018, 5:22 PM

## 2018-01-24 NOTE — Plan of Care (Signed)

## 2018-01-24 NOTE — Consult Note (Signed)
Cardiology Consultation:   Patient ID: Kathy Bruce; [id number]; [birth year]   Admit date: 01/23/2018 Date of Consult: 01/24/2018  Primary Care Provider: Hayden Rasmussen, MD Primary Cardiologist: Dr Ellyn Hack  Patient Profile:   Kathy Bruce is a 52 y.o. female with a hx of CAD who is being seen today for the evaluation of chest pain at the request of Elwyn Reach, MD.  History of Present Illness:   Kathy Bruce is a 52 y.o. female with a h/o hypertensive NICM, CAD, EF initially ~25% -> improved to 30-35% in ~2014 and on Jan 2018 --> is s/p L Lateral SubQ ICD. Followed by Dr Ellyn Hack, last seen in June 2019.  She was initially seen by Dr. Debara Pickett in the hospital in Jan 2019. Over the next months started on carvedilol and entresto. Admitted in June 2019 with acute gout attack and very hypertensive.   She presented to the ER with sudden onset of chest pain lasting several minutes which is also recurrent, sharp, shooting to her back.  Symptoms have been going on since last night. She also complains of left foot - acute gout pain. Chest pain improved with some pain medications and nitroglycerin. Denied any nausea vomiting or diaphoresis.  ECG as well as enzymes were negative.  CT angiography was also reassuring but due to ongoing recurrent pain patient is being admitted for rule out MI. While in ED patient's blood pressure was 162 about 112 with a heart rate of 139 respiratory rate of 26 oxygen sat 100% on room air.  Creatinine is 1.33 glucose 102 with normal white count and BUN.  Initial troponin 0.03.  CT angiogram showed no evidence of any acute abnormalities.  Chest x-ray also showed no active disease.  Patient was started on heparin drip in the ER and placed on aspirin and statin and being admitted for observation.  Past Medical History:  Diagnosis Date  . Chronic kidney disease (CKD)   . Coronary artery disease involving native heart with angina pectoris [facility]) 2014   Patient reports  MI in 2013 (in [place]) -- 2nd [place] 2014 in [place], [place] --? stent placement when patient was living in [place]. Need outside records.   . Dilated cardiomyopathy ([place]) 2013   Patient reports this was secondary to MI and she to have AICD placed.   Marland Kitchen GERD (gastroesophageal reflux disease)   . Gout   . Hypertensive heart disease with chronic combined systolic and diastolic congestive heart failure ([place]) 2013  . ICD (implantable cardioverter-defibrillator) in place, [place], 2013 2013   [facility] in [place], (DR. [place])  . Unspecified mood (affective) disorder [facility])     Past Surgical History:  Procedure Laterality Date  . CARDIAC DEFIBRILLATOR PLACEMENT  [address] [employer] ICD, subcu device, single lead. Implanted in [place]  . COLONOSCOPY WITH PROPOFOL N/A 10/05/2017   Procedure: COLONOSCOPY WITH PROPOFOL;  Surgeon: Yetta Flock, MD;  Location: [facility] END[facility];  Service: Gastroenterology;  Laterality: N/A;  . TRANSTHORACIC ECHOCARDIOGRAM  06/2017   Mild LV dilation.  Normal thickness.  Mildly reduced EF of 30-35%.  Moderate diffuse HK.  GRII DD.  Moderate-severe MR.  Mild LA dilation     Inpatient Medications: Scheduled Meds: . allopurinol  300 mg Oral Daily  . aspirin EC  81 mg Oral Daily  . atorvastatin  40 mg Oral QHS  . carvedilol  25 mg Oral BID WC  . cloNIDine  0.1 mg Oral Daily  . colchicine  0.6 mg Oral Daily  . furosemide  40 mg Oral Daily  . isosorbide mononitrate  30 mg Oral Daily  . multivitamin with minerals  1 tablet Oral Daily  . omega-3 acid ethyl esters  1 g Oral Daily  . pantoprazole  40 mg Oral Daily  . predniSONE  40 mg Oral Q breakfast  . sacubitril-valsartan  1 tablet Oral BID   Continuous Infusions: . heparin 1,050 Units/hr (01/24/18 0308)   PRN Meds: acetaminophen, aspirin-acetaminophen-caffeine, ketorolac, naphazoline-glycerin, nitroGLYCERIN, ondansetron (ZOFRAN) IV, oxyCODONE-acetaminophen,  zolpidem  Allergies:   No Known Allergies  Social History:   Social History   Socioeconomic History  . Marital status: Married    Spouse name: Not on file  . Number of children: Not on file  . Years of education: Not on file  . Highest education level: Not on file  Occupational History  . Not on file  Social Needs  . Financial resource strain: Not on file  . Food insecurity:    Worry: Not on file    Inability: Not on file  . Transportation needs:    Medical: Not on file    Non-medical: Not on file  Tobacco Use  . Smoking status: Never Smoker  . Smokeless tobacco: Never Used  Substance and Sexual Activity  . Alcohol use: Yes    Frequency: Never    Comment: Occasionally  . Drug use: No  . Sexual activity: Yes  Lifestyle  . Physical activity:    Days per week: Not on file    Minutes per session: Not on file  . Stress: Not on file  Relationships  . Social connections:    Talks on phone: Not on file    Gets together: Not on file    Attends religious service: Not on file    Active member of club or organization: Not on file    Attends meetings of clubs or organizations: Not on file    Relationship status: Not on file  . Intimate partner violence:    Fear of current or ex partner: Not on file    Emotionally abused: Not on file    Physically abused: Not on file    Forced sexual activity: Not on file  Other Topics Concern  . Not on file  Social History Narrative   Recently moved to [place] from [place] in October 2018. Lives in an apartment with her husband.    Family History:    Family History  Problem Relation Age of Onset  . Hyperthyroidism Mother   . Diabetes Mother   . Tuberculosis Father   . Heart attack Brother   . Hypertension Sister   . Arthritis Sister   . Hyperthyroidism Brother   . Hypertension Brother   . Hypertension Son   . Gout Son   . Diabetes Daughter   . Stomach cancer Neg Hx   . Colon cancer Neg Hx      ROS:  Please see the  history of present illness.  All other ROS reviewed and negative.     Physical Exam/Data:   Vitals:   01/23/18 1922 01/23/18 1930 01/23/18 2039 01/24/18 0519  BP: 138/86 (!) 151/90 (!) 159/100 122/85  Pulse: (!) 119  (!) 113 97  Resp: (!) 26 17 18 18   Temp:   98.7 F (37.1 C) 98.9 F (37.2 C)  TempSrc:   Oral Oral  SpO2: 100% 97% 97% 94%  Weight:      Height:  Intake/Output Summary (Last 24 hours) at 01/24/2018 0949 Last data filed at 01/24/2018 0600 Gross per 24 hour  Intake 1100.67 ml  Output -  Net 1100.67 ml   Filed Weights   01/23/18 1350  Weight: 80.3 kg   Body mass index is 32.37 kg/m.  General:  Well nourished, well developed, in no acute distress HEENT: normal Lymph: no adenopathy Neck: no JVD Endocrine:  No thryomegaly Vascular: No carotid bruits; FA pulses 2+ bilaterally without bruits  Cardiac:  normal S1, S2; RRR; 4/6 systolic murmur Lungs:  clear to auscultation bilaterally, no wheezing, rhonchi or rales  Abd: soft, nontender, no hepatomegaly  Ext: swelling of the left foot Musculoskeletal:  No deformities, BUE and BLE strength normal and equal Skin: warm and dry  Neuro:  CNs 2-12 intact, no focal abnormalities noted Psych:  Normal affect   EKG:  The EKG was personally reviewed and demonstrates:  Sinus tachycardia, LVH, negative T waves in the inferolateral leads - personally reviewed Telemetry:  Telemetry was personally reviewed and demonstrates:  ST ventricular rates 90-110 - personally reviewed  Relevant CV Studies:  Laboratory Data:  Chemistry Recent Labs  Lab 01/23/18 1414  NA 141  K 4.6  CL 107  CO2 22  GLUCOSE 102*  BUN 15  CREATININE 1.33*  CALCIUM 10.3  GFRNONAA 45*  GFRAA 52*  ANIONGAP 12    No results for input(s): PROT, ALBUMIN, AST, ALT, ALKPHOS, BILITOT in the last 168 hours. Hematology Recent Labs  Lab 01/23/18 1414 01/24/18 0038  WBC 7.3 6.9  RBC 3.81* 3.32*  HGB 12.0 10.4*  HCT 36.2 31.6*  MCV 95.0 95.2   MCH 31.5 31.3  MCHC 33.1 32.9  RDW 13.6 13.6  PLT 393 321   Cardiac Enzymes Recent Labs  Lab 01/23/18 1416 01/23/18 1716  TROPONINI <0.03 <0.03    Recent Labs  Lab 01/23/18 1418  TROPIPOC 0.00    BNP Recent Labs  Lab 01/23/18 1416  BNP 15.7    DDimer  Recent Labs  Lab 01/23/18 1416  DDIMER 1.20*    Radiology/Studies:  Ct Angio Chest Pe W And/or Wo Contrast  Result Date: 01/23/2018 CLINICAL DATA:  52 year old female with acute chest pain and shortness of breath for 1 day. Elevated D-dimer. EXAM: CT ANGIOGRAPHY CHEST WITH CONTRAST TECHNIQUE: Multidetector CT imaging of the chest was performed using the standard protocol during bolus administration of intravenous contrast. Multiplanar CT image reconstructions and MIPs were obtained to evaluate the vascular anatomy. CONTRAST:  165mL ISOVUE-370 IOPAMIDOL (ISOVUE-370) INJECTION 76% COMPARISON:  08/31/2017 chest CT FINDINGS: Cardiovascular: This is a technically adequate study but mild motion artifact in the LOWER lungs slightly limits sensitivity. No pulmonary emboli are identified. Cardiomegaly noted. No thoracic aortic aneurysm or pericardial effusion. A LEFT subcutaneous defibrillator is again noted. Mediastinum/Nodes: No enlarged mediastinal, hilar, or axillary lymph nodes. Thyroid gland, trachea, and esophagus demonstrate no significant findings. Lungs/Pleura: Lungs are clear. No pleural effusion or pneumothorax. Upper Abdomen: No acute abnormality Musculoskeletal: No chest wall abnormality. No acute or significant osseous findings. Review of the MIP images confirms the above findings. IMPRESSION: 1. No evidence of acute abnormality. No evidence of pulmonary emboli. 2. Cardiomegaly Electronically Signed   By: Margarette Canada M.D.   On: 01/23/2018 18:32   Dg Chest Port 1 View  Result Date: 01/23/2018 CLINICAL DATA:  Left-sided chest pain. EXAM: PORTABLE CHEST 1 VIEW COMPARISON:  Chest x-ray dated November 18, 2017. FINDINGS: Unchanged  single lead defibrillator in the left lateral chest  wall. Stable cardiomegaly. Normal pulmonary vascularity. No focal consolidation, pleural effusion, or pneumothorax. No acute osseous abnormality. IMPRESSION: No active disease. Electronically Signed   By: Titus Dubin M.D.   On: 01/23/2018 14:23   TTE: 07/03/2017  - Left ventricle: The cavity size was mildly dilated. Wall   thickness was normal. Systolic function was moderately to   severely reduced. The estimated ejection fraction was in the   range of 30% to 35%. Moderate diffuse hypokinesis with no   identifiable regional variations. Features are consistent with a   pseudonormal left ventricular filling pattern, with concomitant   abnormal relaxation and increased filling pressure (grade 2   diastolic dysfunction). - Mitral valve: There was moderate to severe regurgitation directed   centrally. The acceleration rate of the regurgitant jet was   reduced, consistent with a low dP/dt. - Left atrium: The atrium was mildly dilated.    Assessment and Plan:   1. Chest pain - the patient has been labeled as hypertensive non-ischemic cardiomyopathy, however no cath or stress at [facility], per patient normal cath in 2014 in [place]. She is not a good candidate for coronary CTA and she is persistently tachycardic. I would schedule her for a right and left sided cardiac catheterization to evaluate for CAD and mitral regurgitation. She will need a cath prior to possible mitral valve repair anyway.  2. Hypertensive heart disease with CHF - continue high dose of Entresto, carvedilol, add corlanor for HR control as bellow - also she should be on imdur/hydralazine rather than clonidine  3. CAD - per patient prior MI, but normal cath, see above  4. Persistent sinus tachycardia - in a patient with LVEF < 35% and on maximum dos of betablocker, I will start corlanor 5 mg PO BID  5. Severe mitral regurgitation - I have personally reviewed her echo from Jan  2019, we will repeat to evaluate for MR and LVEF with Entresto  6. Acute gout - continue colchicine, pain control  For questions or updates, please contact [place] Please consult [email or web address] for contact info under Cardiology/STEMI.   Signed, Ena Dawley, MD  01/24/2018 9:49 AM

## 2018-01-24 NOTE — Progress Notes (Signed)
ANTICOAGULATION CONSULT NOTE - Initial Consult  Pharmacy Consult for Heparin Indication: ACS  No Known Allergies  Patient Measurements: Height: 5\' 2"  (157.5 cm) Weight: 177 lb (80.3 kg) IBW/kg (Calculated) : 50.1 Heparin Dosing Weight: 67 kg  Vital Signs: Temp: 98.9 F (37.2 C) (08/18 0519) Temp Source: Oral (08/18 0519) BP: 122/85 (08/18 0519) Pulse Rate: 97 (08/18 0519)  Labs: Recent Labs    01/23/18 1414 01/23/18 1416 01/23/18 1716 01/24/18 0038 01/24/18 0949  HGB 12.0  --   --  10.4*  --   HCT 36.2  --   --  31.6*  --   PLT 393  --   --  321  --   APTT  --  30  --   --   --   LABPROT  --  12.6  --   --   --   INR  --  0.95  --   --   --   HEPARINUNFRC  --   --   --  0.71* 0.61  CREATININE 1.33*  --   --   --  1.07*  TROPONINI  --  <0.03 <0.03  --   --    Estimated Creatinine Clearance: 60.4 mL/min (A) (by C-G formula based on SCr of 1.07 mg/dL (H)).  Medical History: Past Medical History:  Diagnosis Date  . Chronic kidney disease (CKD)   . Coronary artery disease involving native heart with angina pectoris [facility]) 2014   Patient reports MI in 2013 (in [place]) -- 2nd [place] 2014 in [place], [place] --? stent placement when patient was living in [place]. Need outside records.   . Dilated cardiomyopathy ([place]) 2013   Patient reports this was secondary to MI and she to have AICD placed.   Marland Kitchen GERD (gastroesophageal reflux disease)   . Gout   . Hypertensive heart disease with chronic combined systolic and diastolic congestive heart failure ([place]) 2013  . ICD (implantable cardioverter-defibrillator) in place, [place], 2013 2013   [facility] in [place], (DR. [place])  . Unspecified mood (affective) disorder ([facility])    Medications:  No oral anticoagulation PTA  Assessment:  51 yr female presents with chest pain, dizziness and shortness of breath  PMH signficant for CKD, CAD (h/o MI), CHF, defibrillator  Heparin 2000 unit bolus,  infusion begun 8/17 at 1150 units/hr, reduced to 1050 units/hr for Hep level 0.71  Chest CT neg for PE, continue Heparin for ACS, Troponins neg x2  Today, 01/24/2018 Heparin level 0.61 units/ml on infusion at 1050 units/hr CBC low, stable, Plt wnl  Goal of Therapy:  Heparin level 0.3-0.7 units/ml Monitor platelets by anticoagulation protocol: Yes   Plan:   Continue Heparin at 1050 units/hr  Re-check Heparin level at 1800  Daily CBC, order daily Heparin level when at steady state  Minda Ditto PharmD Pager [phone] 01/24/2018, 11:10 AM

## 2018-01-24 NOTE — Progress Notes (Signed)
Pt states she has an emergency at home and has to leave. MD notified. Pt informed of risks. Signed out AMA. Instructed to follow up with PCP and cardiologist.

## 2018-02-13 IMAGING — CT CT ANGIO CHEST-ABD-PELV FOR DISSECTION W/ AND WO/W CM
2 of 8 series · 12 of 46 positions shown, 14 images · IV contrast (ISOVUE)
Comparison: Chest radiograph July 02, 2017

CLINICAL DATA: Chest and abdominal pain

EXAM:
CT ANGIOGRAPHY CHEST, ABDOMEN AND PELVIS
TECHNIQUE: Initially, axial CT images were obtained through the chest without
intravenous contrast material administration. Multidetector CT
imaging through the chest, abdomen and pelvis was performed using
the standard protocol during bolus administration of intravenous
contrast. Multiplanar reconstructed images and MIPs were obtained
and reviewed to evaluate the vascular anatomy.
CONTRAST:  100mL OPLJCF-9N1 IOPAMIDOL (OPLJCF-9N1) INJECTION 76%

[Series 9: arterial thins · axial · arterial · 0.86mm/px · z∈[-523,-43]mm · 9 of 1893 slices shown, 11 images]
[im 146/1893  soft-tissue]
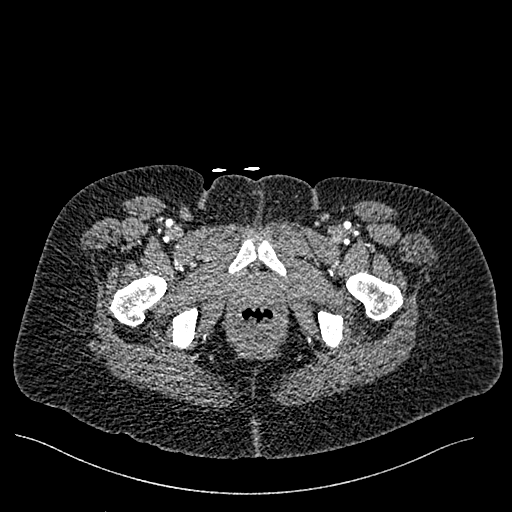
[im 146/1893  bone]
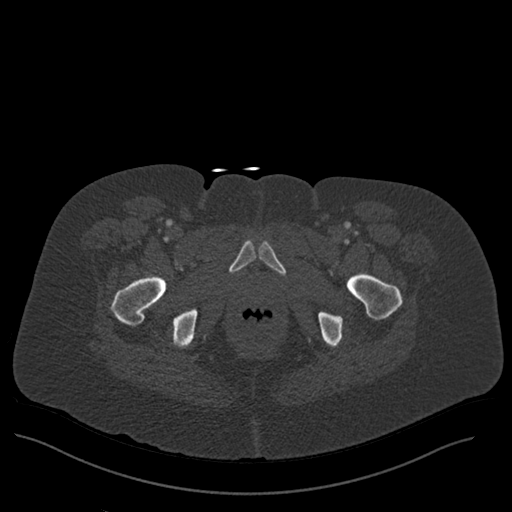
[im 364/1893  soft-tissue]
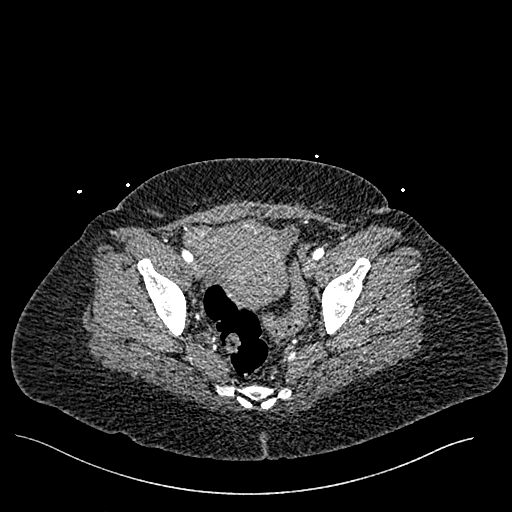
[im 510/1893  soft-tissue]
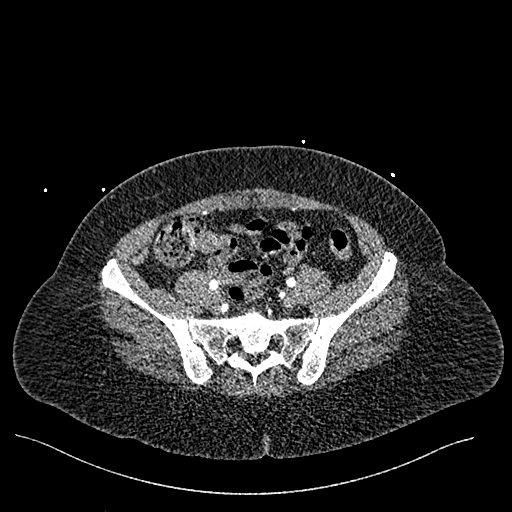
[im 728/1893  soft-tissue]
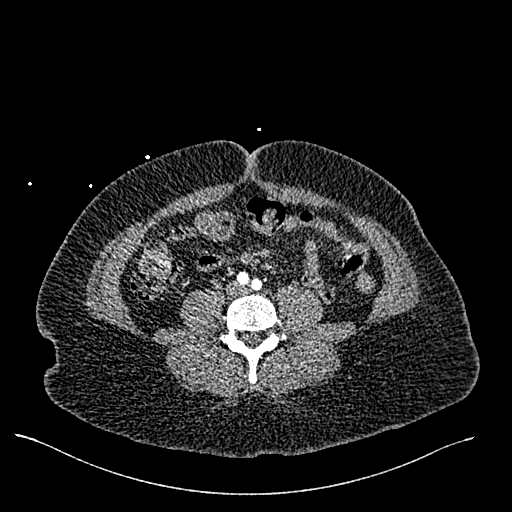
[im 947/1893  soft-tissue]
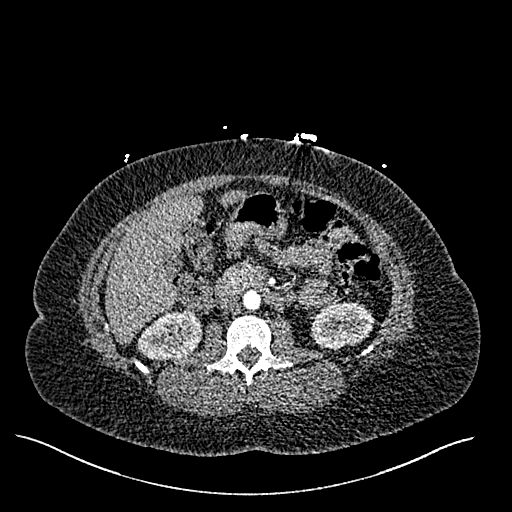
[im 1165/1893  soft-tissue]
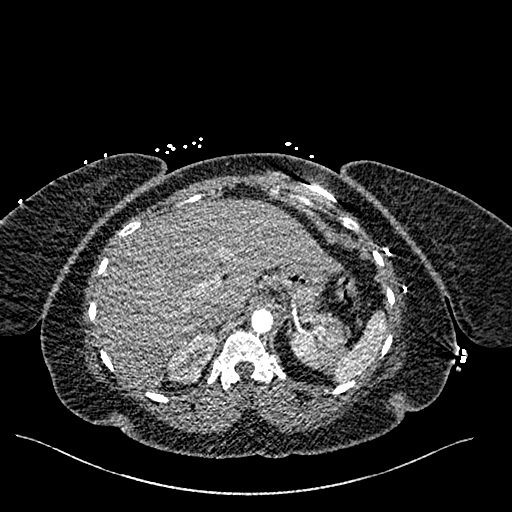
[im 1383/1893  soft-tissue]
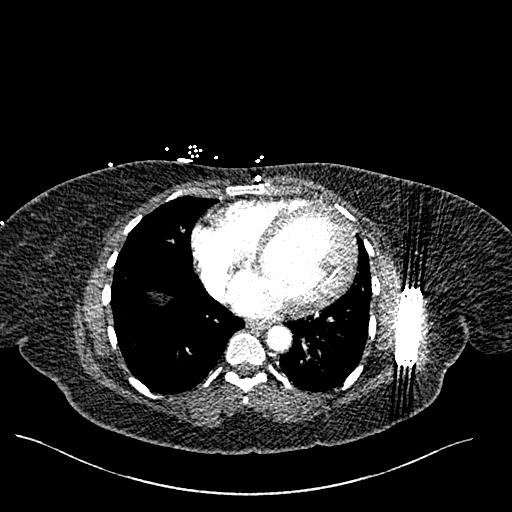
[im 1529/1893  soft-tissue]
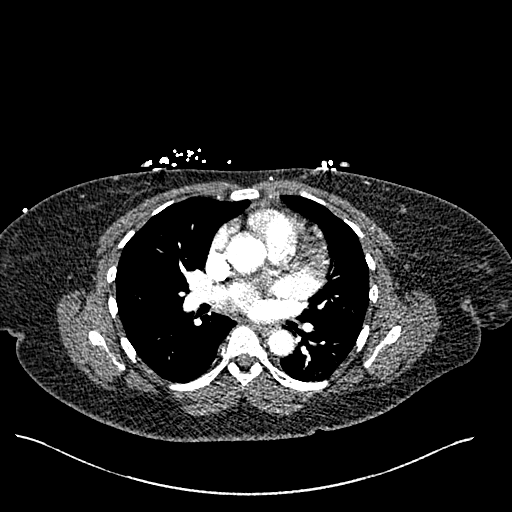
[im 1747/1893  soft-tissue]
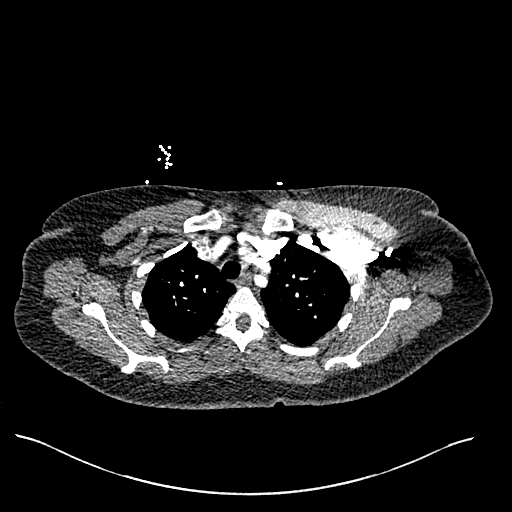
[im 1747/1893  bone]
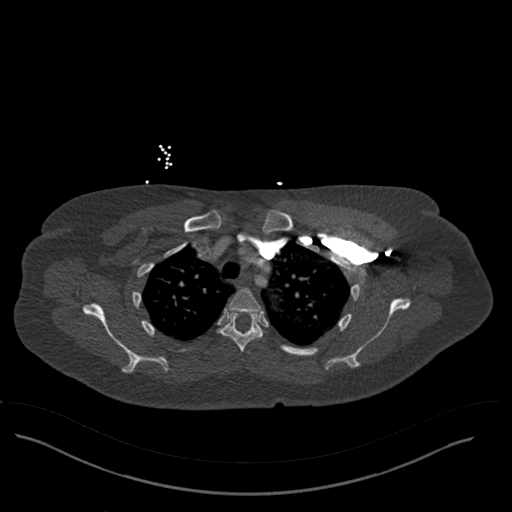

[Series 11: coronals · coronal · 0.71mm/px · 3 of 130 slices shown]
[im 33/130  soft-tissue]
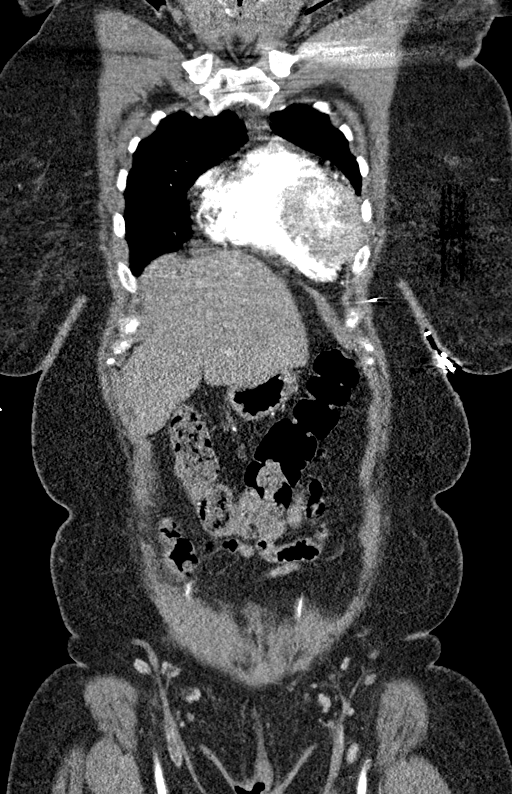
[im 65/130  soft-tissue]
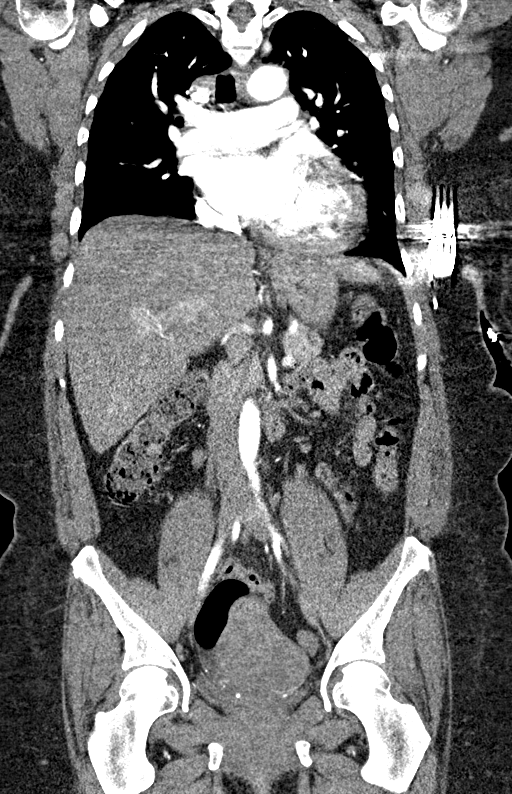
[im 97/130  soft-tissue]
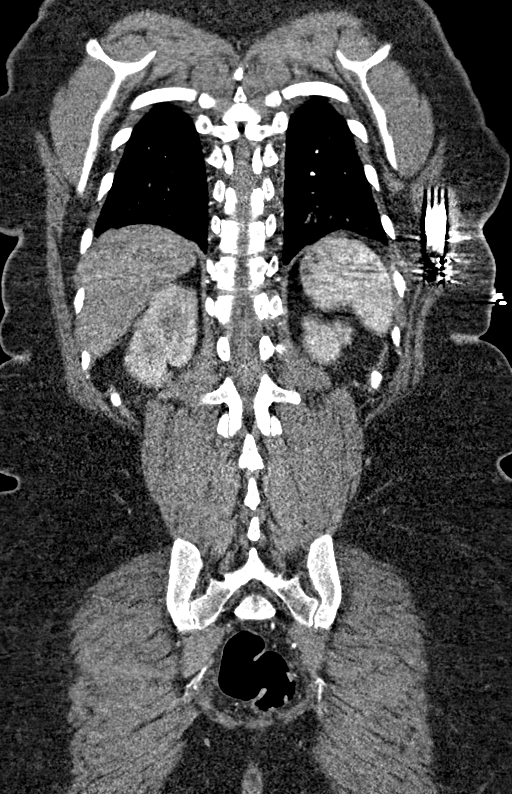

[12 of 46 positions shown; findings below may reference images not displayed]

FINDINGS: CTA CHEST FINDINGS

Cardiovascular: There is no intramural hematoma in the thoracic
aorta on the noncontrast enhanced study. There is no thoracic aortic
aneurysm or dissection. The visualized great vessels appear
unremarkable. Note that the right and left common carotid arteries
arise as a common trunk, an anatomic variant. There is no
appreciable pericardial thickening or pericardial effusion. There is
no demonstrable pulmonary embolus. A defibrillator is present on the
left with the defibrillator lead tip in the soft tissues anteriorly
on the left.

Mediastinum/Nodes: Visualized thyroid appears normal. No adenopathy
is evident in the thoracic region by size criteria. There are
scattered subcentimeter axillary lymph nodes, regarded as
nonspecific. No esophageal lesions are evident.

Lungs/Pleura: There is no lung edema or consolidation. No pleural
effusion or pleural thickening evident.

Musculoskeletal: There are no blastic or lytic bone lesions.

Review of the MIP images confirms the above findings.

CTA ABDOMEN AND PELVIS FINDINGS

VASCULAR

Aorta: No appreciable thoracic aortic aneurysm or dissection. No
appreciable aortic narrowing or calcification. No appreciable
atherosclerotic plaque.

Celiac: Celiac artery and its major branches appear patent. No
aneurysm or dissection.

SMA: Superior mesenteric artery and its branches appear patent. No
aneurysm or dissection.

Either renal artery or its branches. No fibromuscular dysplasia. No
aneurysm or dissection on either side. Each

IMA: Inferior mesenteric artery and its branches are patent. No
aneurysm or dissection.

Inflow: Major pelvic arterial vessels are patent as are proximal
superficial femoral and profunda femoral arteries. No pelvic or
upper thigh vascular atherosclerosis appreciable. No aneurysm or
dissection in the major pelvic arterial vessels.

Veins: No obvious venous abnormality within the limitations of this
arterial phase study.

Review of the MIP images confirms the above findings.

NON-VASCULAR

Hepatobiliary: No focal liver lesions are apparent. Gallbladder wall
is not appreciably thickened. There is no biliary duct dilatation.

Pancreas: No pancreatic mass or inflammatory focus.

Spleen: No splenic lesions are evident.

Adrenals/Urinary Tract: Adrenals bilaterally appear unremarkable.
Kidneys bilaterally show no evident mass or hydronephrosis on either
side. There is no appreciable renal or ureteral calculus on either
side. Urinary bladder is midline with wall thickness within normal
limits.

Stomach/Bowel: There is no appreciable bowel wall or mesenteric
thickening. No evident bowel obstruction. No free air or portal
venous air.

Lymphatic: No adenopathy is apparent in the abdomen or pelvis.

Reproductive: Uterus is anteverted. Uterus appears somewhat
lobulated in contour, likely due to underlying leiomyomatous change.
There is a mass arising from the rightward aspect of the uterus
somewhat eccentrically felt to represent a leiomyoma measuring 5.5 x
4.4 x 3.6 cm. No extrauterine pelvic mass evident.

Other: Appendix appears unremarkable. No abscess or ascites is
evident in the abdomen or pelvis.

There is a focal ventral hernia slightly superior to the umbilicus
containing fat and mild changes of panniculitis. This hernia has a
neck measurement from right to left of 1.2 cm and from superior to
inferior dimension of 0.8 cm. No bowel containing hernia evident.

Musculoskeletal: There are no blastic or lytic bone lesions. There
is no intramuscular or abdominal wall lesion.

Review of the MIP images confirms the above findings.
IMPRESSION: CT angiogram chest:

1. No thoracic aortic aneurysm or dissection. No evident pulmonary
embolus.

2. Subcutaneous defibrillator present with lead tip anterior left
chest wall.

3.  No lung edema or consolidation.

4.  No evident thoracic adenopathy.

CT angiogram abdomen; CT angiogram pelvis:

1. No aneurysm or dissection in the aorta or major mesenteric/pelvic
arterial vessels. No appreciable atherosclerotic change noted in the
arterial vessels in the abdomen or pelvis.

2. Focal ventral hernia slightly superior to the umbilicus which
contains fat and mild panniculitis. No bowel containing hernia
evident.

3.  Leiomyomatous uterus.

4.  No bowel obstruction.  No abscess.  No ascites.

5.  No renal or ureteral calculus.  No hydronephrosis.

## 2018-03-17 ENCOUNTER — Encounter: Payer: Medicaid Other | Admitting: Cardiology

## 2018-03-18 ENCOUNTER — Encounter: Payer: Self-pay | Admitting: *Deleted

## 2018-03-21 NOTE — Progress Notes (Signed)
This encounter was created in error - please disregard.

## 2018-04-05 ENCOUNTER — Telehealth: Payer: Self-pay

## 2018-04-05 NOTE — Telephone Encounter (Signed)
Called and LM for pt to go to the lab.  Sent letter and also went to [employer]

## 2018-04-05 NOTE — Telephone Encounter (Signed)
-----   Message from Larina Bras, [place] sent at 10/12/2017 10:36 AM EDT ----- Remind pt to have lft's drawn around 04/11/18. Orders in epic. See 10/09/17 labs .Marland KitchenMarland KitchenMarland KitchenMarland Kitchen
